# Patient Record
Sex: Female | Born: 1935
Health system: Southern US, Community
[De-identification: ages and names within clinical notes are randomized; demographics above are authoritative.]

## PROBLEM LIST (undated history)

## (undated) DIAGNOSIS — M1990 Unspecified osteoarthritis, unspecified site: Secondary | ICD-10-CM

## (undated) DIAGNOSIS — R32 Unspecified urinary incontinence: Secondary | ICD-10-CM

## (undated) DIAGNOSIS — M549 Dorsalgia, unspecified: Secondary | ICD-10-CM

## (undated) DIAGNOSIS — I82409 Acute embolism and thrombosis of unspecified deep veins of unspecified lower extremity: Secondary | ICD-10-CM

## (undated) DIAGNOSIS — R200 Anesthesia of skin: Secondary | ICD-10-CM

## (undated) DIAGNOSIS — S22000A Wedge compression fracture of unspecified thoracic vertebra, initial encounter for closed fracture: Secondary | ICD-10-CM

## (undated) DIAGNOSIS — E079 Disorder of thyroid, unspecified: Secondary | ICD-10-CM

## (undated) HISTORY — DX: Dorsalgia, unspecified: M54.9

## (undated) HISTORY — PX: CARPAL TUNNEL RELEASE: SHX101

## (undated) HISTORY — PX: ABDOMINAL HYSTERECTOMY: SHX81

## (undated) HISTORY — PX: OTHER SURGICAL HISTORY: SHX169

## (undated) HISTORY — DX: Unspecified urinary incontinence: R32

## (undated) HISTORY — DX: Acute embolism and thrombosis of unspecified deep veins of unspecified lower extremity: I82.409

## (undated) HISTORY — DX: Unspecified osteoarthritis, unspecified site: M19.90

## (undated) HISTORY — DX: Wedge compression fracture of unspecified thoracic vertebra, initial encounter for closed fracture: S22.000A

## (undated) HISTORY — PX: BACK SURGERY: SHX140

---

## 1999-10-09 ENCOUNTER — Other Ambulatory Visit: Admission: RE | Admit: 1999-10-09 | Discharge: 1999-10-09 | Payer: Self-pay | Admitting: Family Medicine

## 2000-10-29 ENCOUNTER — Other Ambulatory Visit: Admission: RE | Admit: 2000-10-29 | Discharge: 2000-10-29 | Payer: Self-pay | Admitting: *Deleted

## 2001-12-06 ENCOUNTER — Other Ambulatory Visit: Admission: RE | Admit: 2001-12-06 | Discharge: 2001-12-06 | Payer: Self-pay | Admitting: *Deleted

## 2006-01-08 ENCOUNTER — Other Ambulatory Visit: Admission: RE | Admit: 2006-01-08 | Discharge: 2006-01-08 | Payer: Self-pay | Admitting: Internal Medicine

## 2007-11-29 ENCOUNTER — Encounter: Admission: RE | Admit: 2007-11-29 | Discharge: 2007-11-29 | Payer: Self-pay | Admitting: Orthopedic Surgery

## 2008-03-19 ENCOUNTER — Encounter: Admission: RE | Admit: 2008-03-19 | Discharge: 2008-03-19 | Payer: Self-pay | Admitting: Orthopedic Surgery

## 2008-07-28 ENCOUNTER — Emergency Department (HOSPITAL_COMMUNITY): Admission: EM | Admit: 2008-07-28 | Discharge: 2008-07-28 | Payer: Self-pay | Admitting: Family Medicine

## 2008-11-15 ENCOUNTER — Encounter: Admission: RE | Admit: 2008-11-15 | Discharge: 2008-11-15 | Payer: Self-pay | Admitting: Neurosurgery

## 2008-12-21 ENCOUNTER — Inpatient Hospital Stay (HOSPITAL_COMMUNITY): Admission: RE | Admit: 2008-12-21 | Discharge: 2008-12-27 | Payer: Self-pay | Admitting: Neurosurgery

## 2009-03-23 ENCOUNTER — Encounter: Admission: RE | Admit: 2009-03-23 | Discharge: 2009-03-23 | Payer: Self-pay | Admitting: Internal Medicine

## 2009-03-30 ENCOUNTER — Ambulatory Visit (HOSPITAL_COMMUNITY): Admission: RE | Admit: 2009-03-30 | Discharge: 2009-03-30 | Payer: Self-pay | Admitting: Obstetrics and Gynecology

## 2009-04-18 ENCOUNTER — Ambulatory Visit: Admission: RE | Admit: 2009-04-18 | Discharge: 2009-04-18 | Payer: Self-pay | Admitting: Gynecologic Oncology

## 2009-05-01 ENCOUNTER — Inpatient Hospital Stay (HOSPITAL_COMMUNITY): Admission: RE | Admit: 2009-05-01 | Discharge: 2009-05-04 | Payer: Self-pay | Admitting: Orthopedic Surgery

## 2009-05-01 ENCOUNTER — Encounter: Payer: Self-pay | Admitting: Gynecology

## 2009-06-15 ENCOUNTER — Ambulatory Visit: Admission: RE | Admit: 2009-06-15 | Discharge: 2009-06-15 | Payer: Self-pay | Admitting: Gynecology

## 2009-11-21 ENCOUNTER — Ambulatory Visit: Admission: RE | Admit: 2009-11-21 | Discharge: 2009-11-21 | Payer: Self-pay | Admitting: Gynecology

## 2009-11-21 ENCOUNTER — Other Ambulatory Visit: Admission: RE | Admit: 2009-11-21 | Discharge: 2009-11-21 | Payer: Self-pay | Admitting: Gynecology

## 2010-04-21 ENCOUNTER — Emergency Department (HOSPITAL_COMMUNITY): Admission: EM | Admit: 2010-04-21 | Discharge: 2010-04-21 | Payer: Self-pay | Admitting: Emergency Medicine

## 2010-05-29 ENCOUNTER — Ambulatory Visit: Admission: RE | Admit: 2010-05-29 | Discharge: 2010-05-29 | Payer: Self-pay | Admitting: Gynecology

## 2010-05-29 ENCOUNTER — Other Ambulatory Visit: Admission: RE | Admit: 2010-05-29 | Discharge: 2010-05-29 | Payer: Self-pay | Admitting: Gynecology

## 2010-10-31 ENCOUNTER — Encounter
Admission: RE | Admit: 2010-10-31 | Discharge: 2010-10-31 | Payer: Self-pay | Source: Home / Self Care | Attending: Obstetrics and Gynecology | Admitting: Obstetrics and Gynecology

## 2011-02-24 LAB — COMPREHENSIVE METABOLIC PANEL
Albumin: 3.8 g/dL (ref 3.5–5.2)
Alkaline Phosphatase: 54 U/L (ref 39–117)
BUN: 24 mg/dL — ABNORMAL HIGH (ref 6–23)
Chloride: 106 mEq/L (ref 96–112)
Creatinine, Ser: 0.7 mg/dL (ref 0.4–1.2)
GFR calc non Af Amer: >60 mL/min (ref >60)
Glucose, Bld: 103 mg/dL — ABNORMAL HIGH (ref 70–99)
Total Bilirubin: 0.5 mg/dL (ref 0.3–1.2)

## 2011-02-24 LAB — CBC
HCT: 38.2 % (ref 36.0–46.0)
Hemoglobin: 12 g/dL (ref 12.0–15.0)
Hemoglobin: 12.8 g/dL (ref 12.0–15.0)
MCHC: 33.5 g/dL (ref 30.0–36.0)
MCV: 96 fL (ref 78.0–100.0)
MCV: 95.5 fL (ref 78.0–100.0)
Platelets: 192 10*3/uL (ref 150–400)
RBC: 3.72 MIL/uL — ABNORMAL LOW (ref 3.87–5.11)
WBC: 10 10*3/uL (ref 4.0–10.5)
WBC: 4.8 10*3/uL (ref 4.0–10.5)

## 2011-02-24 LAB — TYPE AND SCREEN
ABO/RH(D): A NEG
Antibody Screen: NEGATIVE

## 2011-02-24 LAB — BASIC METABOLIC PANEL
CO2: 26 mEq/L (ref 19–32)
Calcium: 8.2 mg/dL — ABNORMAL LOW (ref 8.4–10.5)
Chloride: 99 mEq/L (ref 96–112)
GFR calc Af Amer: >60 mL/min (ref >60)
Sodium: 132 mEq/L — ABNORMAL LOW (ref 135–145)

## 2011-02-24 LAB — DIFFERENTIAL
Basophils Absolute: 0 10*3/uL (ref 0.0–0.1)
Basophils Relative: 0 % (ref 0–1)
Lymphocytes Relative: 28 % (ref 12–46)
Monocytes Absolute: 0.2 10*3/uL (ref 0.1–1.0)
Neutro Abs: 3.2 10*3/uL (ref 1.7–7.7)
Neutrophils Relative %: 66 % (ref 43–77)

## 2011-02-24 LAB — ABO/RH: ABO/RH(D): A NEG

## 2011-03-04 LAB — ABO/RH: ABO/RH(D): A NEG

## 2011-03-04 LAB — CBC
HCT: 40.2 % (ref 36.0–46.0)
Platelets: 250 10*3/uL (ref 150–400)
RDW: 13.5 % (ref 11.5–15.5)
WBC: 5.9 10*3/uL (ref 4.0–10.5)

## 2011-03-04 LAB — BASIC METABOLIC PANEL
BUN: 15 mg/dL (ref 6–23)
Creatinine, Ser: 0.51 mg/dL (ref 0.4–1.2)
GFR calc non Af Amer: >60 mL/min (ref >60)
Glucose, Bld: 95 mg/dL (ref 70–99)

## 2011-03-04 LAB — TYPE AND SCREEN: ABO/RH(D): A NEG

## 2011-04-01 NOTE — Consult Note (Signed)
[name], [name]               ACCOUNT NO.:  [id number]   MEDICAL RECORD NO.:  [id number]          PATIENT TYPE:  OUT   LOCATION:  GYN                          FACILITY:  [facility]   PHYSICIAN:  John T. Kyla Balzarine, M.D.    DATE OF BIRTH:  03/08/1928   DATE OF CONSULTATION:  04/18/2009  DATE OF DISCHARGE:                                 CONSULTATION   CHIEF COMPLAINT:  [name] is a 77 year old seen at the request of Dr.  Huntley Dec and Dr. Ricki Miller with a newly diagnosed pelvic mass.   HISTORY OF PRESENT ILLNESS:  The patient underwent spinal surgery in  February 2010 and dates most of her symptoms to following her disk  surgery.  She had noted some abdominal twitching in the autumn of  2009, but was aware of a tight corseting associated with her back  brace.  Over the past 2 months she has had a sensation of something  moving in the lower abdomen, but with pressure symptoms.  She has had  urinary incontinence for many years but feels that this has worsened and  she had some transient constipation following her back surgery.  She has  noted bilateral leg swelling since surgery, treated with a short course  of Lasix and compression hose with improvement.  Unchanged since surgery  is sciatic type pain shooting down both legs with distal numbness.  CT  of abdomen and pelvis performed 05/07 revealed a 12.2 x 9.2 x 9.4 cm  mass with nodularity along the periphery, located centrally but no  radiologic evidence of lymphadenopathy, carcinomatosis or visceral  metastasis.  Chest x-ray was negative and a CA-125 value was 6.1.  The  patient was seen by Dr. Henderson Cloud, who confirmed a pelvic mass.  Pap smear  was negative.   PAST MEDICAL HISTORY:  Dominated by arthritis with degenerative disk  disease and multiple urinary tract infections.  She is post carpal  tunnel surgery on the left wrist, spinal surgery as above, benign breast  lumpectomy and 1 vaginal delivery.   MEDICATIONS:  Include Detrol LA,  Actonel, low-dose aspirin, Percocet,  Celebrex, magnesium oxide, Lasix (currently discontinued), Protonix or  Mylanta p.r.n., fish oil, Centrum Silver, Os-Cal plus D.   ALLERGIES:  SULFA DRUGS cause hives but Celebrex has given her no  problems.   PERSONAL AND SOCIAL HISTORY:  The patient is married and her husband has  multiple medical problems.  She denies tobacco, ethanol or recreational  drug use.   FAMILY HISTORY:  Significant for 2 sisters with breast cancer, mother  with colon cancer, father and 2 brothers with prostate cancer.   REVIEW OF SYSTEMS:  Essentially negative 10-point system review other  than noted above.   EXAM:  VITAL SIGNS:  Stable and afebrile as recorded in chart.  Weight  102 pounds.  CONSTITUTIONAL:  The patient is alert and oriented x3 in no acute  distress.  ENT:  Benign with clear oropharynx.  NECK:  Supple neck without goiter and no scleral icterus.  LUNGS:  Fields clear.  HEART:  Regular rate and rhythm with no JVD.  BACK:  Kyphoscoliosis with lumbar vertical incision that is well-healed.  No tenderness to spinous or CVA percussion.  ABDOMEN:  Soft and benign with no ascites, tenderness or hernia  palpable.  There is a firm mass palpable 3 fingerbreadths above the  umbilicus which is modestly mobile and there is some tenderness with  manipulation.  EXTREMITIES:  The to strength in the lower extremities and diminished  DTRs in the knees.  Currently no edema, absent cords or Homan's.  NEUROLOGIC:  Diminished DTRs and light sensation in the distal lower  extremities.  Cranial nerves intact.  SKIN:  No lesions.  PELVIC:  External genitalia and BUS, bladder and urethra normal with a  small cystocele and retraction of the bladder cephalad along with the  cervix.  No vaginal or cervical mucosal lesions; atrophic mucosa.  Bimanual and rectovaginal examinations disclose uterus deviated  posterior and to the right.  A central firm mass with minimal  tenderness  to bimanual palpation is anterior to the uterus.  This is relatively  smooth, poorly mobile approximately 12-14 cm greatest diameter.   ASSESSMENT/PLAN:  Pelvic mass with normal CA-125 value, likely benign,  rule out ovarian malignancy.   RECOMMENDATIONS:  I had a long discussion with the patient and her  daughter.  I detailed the need for an exploratory laparotomy with  TAH/BSO.  Frozen section would help guide the procedure and, if  malignancy were confirmed, we would advocate comprehensive surgical  staging.  This was detailed to the patient along with risks and benefits  of the procedure and she wishes to proceed as soon as possible.  We will  make arrangements for surgery to be performed in our service at [facility] within the next couple of weeks as this would be much more  convenient for the patient and family regarding perioperative care.      John T. Kyla Balzarine, M.D.  Electronically Signed     JTS/MEDQ  D:  04/18/2009  T:  04/18/2009  Job:  [id number]   cc:   Guy Sandifer. Henderson Cloud, M.D.  Fax: [phone]   Telford Nab, R.N.  [address]  [place], [place] [address]   Juline Patch, M.D.  Fax: [phone]

## 2011-04-01 NOTE — Op Note (Signed)
Dana Klein, TAPLEY               ACCOUNT NO.:  [id number]   MEDICAL RECORD NO.:  [id number]          PATIENT TYPE:  INP   LOCATION:  3005                         FACILITY:  [facility]   PHYSICIAN:  Danae Orleans. Venetia Maxon, M.D.  DATE OF BIRTH:  [birth year]   DATE OF PROCEDURE:  12/21/2008  DATE OF DISCHARGE:                               OPERATIVE REPORT   PREOPERATIVE DIAGNOSES:  Spondylolisthesis, scoliosis, stenosis,  herniated disk, and radiculopathy, L5-S1.   POSTOPERATIVE DIAGNOSES:  Spondylolisthesis, scoliosis, stenosis,  herniated disk, and radiculopathy, L5-S1.   PROCEDURES:  Laminectomy and decompression, L5-S1 with transforaminal  lumbar interbody fusion with PEEK interbody cage, morcellized bone  autograft and bone morphogenetic protein EquivaBone with nonsegmental  pedicle screw fixation, L5-S1 bilaterally and posterolateral  arthrodesis.   SURGEON:  Danae Orleans. Venetia Maxon, MD   ASSISTANTS:  1. Georgiann Cocker, RN  2. Clydene Fake, MD   ANESTHESIA:  General endotracheal anesthesia.   ESTIMATED BLOOD LOSS:  200 mL   COMPLICATIONS:  None.   DISPOSITION:  Recovery.   INDICATIONS:  Dionisia Pacholski is a 77 year old woman with severe left leg  pain, scoliosis, lumbar spine spondylolisthesis, and also lateral  foraminal stenosis at the L5-S1 level, left greater than right.  It was  elected to take her surgery for decompression and fusion at this  affected level.   PROCEDURE:  Ms. Viele was brought to the operating room.  Following  satisfactory and uncomplicated induction of general endotracheal  anesthesia, and placement of intravenous lines and Foley catheter, the  patient was placed in the prone position on the [place] table.  Her low  back was prepped and draped in the usual sterile fashion.  The area of  planned incision was infiltrated with local lidocaine.  Incision was  made and carried to the lumbodorsal fascia, which was incised  bilaterally.  Subperiosteal dissection was  performed exposing the L5  transverse processes and sacral alae.  Intraoperative x-ray demonstrated  marker probes at L4 and L5 transverse processes and sacral alae.  Subsequently, a laminectomy of L5 on the left was performed and the  thecal sac was decompressed, the S1 nerve root was decompressed, and the  L5 nerve root was decompressed as they extended out the neural foramen.  There was significant spondylolisthesis and significant amount of disk  compression of both the L5 and S1 nerve roots.  It was therefore elected  to perform a diskectomy, and this was done thoroughly from the left  side, and the nerve roots were decompressed.  After thorough diskectomy  and trial sizing, it was elected to perform a transforaminal lumbar  interbody fusion with PEEK cage and an 8-mm cage was selected, packed  with BMP and EquivaBone, additional BMP was placed within the interspace  and additional EquivaBone was placed, and the cage was placed and  rotated into position and tamped into position appropriately.  Additional bone autograft was placed overlying the cage and along with  additional EquivaBone.  All the neural elements were felt to be well  decompressed and foraminal height had been restored.  It was then  elected to decorticate the right side, and the facet joint was  decorticated and eburnated for fusion.  The pedicle screw fixation was  then placed using a 6.5 x 45 mm screw at the sacrum bilaterally and 5.5  x 45 mm screws at L5 bilaterally.  There was a lateral cut out of the  right L5 screw and it was moved medially and appeared to be solidly in  bone.  AP film demonstrated that this was at the edge of the pedicle,  but it was felt at this point that moving it more medially would  probably be more destabilizing than improving its position.  It was felt  to be solidly within the bone, so it was left in its position.  All  other screws appeared to be well within the pedicle.  A 35-mm  preloaded  rods were placed and locked down in situ.  Remaining BMP, EquivaBone,  and autograft was then placed in the right posterolateral region.  Prior  to placing the final bone graft, the wound was extensively irrigated.  Soft tissues were inspected and found to be in good repair and the nerve  roots were well decompressed.  The self-retaining retractors were  removed.  The lumbodorsal fascia was closed with 1 Vicryl suture,  subcutaneous tissues were reapproximated with 2-0 Vicryl interrupted  inverted sutures, and the skin edges were reapproximated with 3-0 Vicryl  subcuticular stitch.  The wound was dressed with benzoin, Steri-Strips,  Telfa gauze, and tape.  The patient was extubated in the operating room  and taken to the recovery in stable satisfactory condition having  tolerated the operation well.  Counts were correct at the end of case.      Danae Orleans. Venetia Maxon, M.D.  Electronically Signed     JDS/MEDQ  D:  12/21/2008  T:  12/22/2008  Job:  [id number]

## 2011-04-01 NOTE — Discharge Summary (Signed)
NAMERESHANDA, Dana Klein               ACCOUNT NO.:  [id number]   MEDICAL RECORD NO.:  [id number]          PATIENT TYPE:  INP   LOCATION:  3005                         FACILITY:  [facility]   PHYSICIAN:  Danae Orleans. Venetia Maxon, M.D.  DATE OF BIRTH:  10-12-1928   DATE OF ADMISSION:  12/21/2008  DATE OF DISCHARGE:                               DISCHARGE SUMMARY   REASON FOR ADMISSION:  Spondylolisthesis, scoliosis, stenosis, and  herniated lumbar disk L5-S1 level.   FINAL DIAGNOSES:  Spondylolisthesis, scoliosis, stenosis, and herniated  lumbar disk L5-S1 level.   HISTORY OF PRESENT ILLNESS AND HOSPITAL COURSE:  [name] is a 77-year-  old woman with significant spondylolisthesis, stenosis, scoliosis at L5-  S1 with may be the left leg pain.  It was elected to take her to surgery  for lumbar decompression and fusion.  She did well with that procedure,  was gradually mobilized with physical therapy.  It was elected to  transfer her for further inpatient rehabilitative care to a skilled  nursing facility and she was discharged home on December 27, 2008, in  stable and satisfactory condition, having tolerated operation and  hospitalization well.  She was instructed to wear back brace when out of  bed, to use pain medication as needed.  This will either consist of  Percocet 1-2 every 4 hours as needed for pain or Vicodin 1-2 every 4  hours as needed for pain.  Additionally, Robaxin 500 mg every 6 hours.  She was instructed to wear her brace when out.  She will return to my  office for a recheck in 3 weeks with repeat radiographs.   Preoperative medications were also continued, these include Detrol LA 4  mg once daily, Actonel 35 mg weekly, fish oil, Centrum, Os-Cal, Osteo Bi-  Flex, and 81 mg aspirin.  She was using Darvocet before her surgery and  can go back to that one.  Her pain is under better control.      Danae Orleans. Venetia Maxon, M.D.  Electronically Signed     JDS/MEDQ  D:  12/27/2008  T:   12/27/2008  Job:  [id number]

## 2011-04-01 NOTE — Op Note (Signed)
Dana Klein, Dana Klein               ACCOUNT NO.:  [id number]   MEDICAL RECORD NO.:  [id number]          PATIENT TYPE:  INP   LOCATION:  0003                         [facility]ACILITY:  [facility]   PHYSICIAN:  De Blanch, M.D.DATE OF BIRTH:  1928/09/14   DATE OF PROCEDURE:  DATE OF DISCHARGE:                               OPERATIVE REPORT   PREOPERATIVE DIAGNOSIS:  Pelvic mass with nodules.   POSTOPERATIVE DIAGNOSIS:  Hematometra with possible endometrial atypical  hyperplasia or grade 1 endometrial carcinoma (no invasion found on  frozen section).   PROCEDURE:  Total abdominal hysterectomy, bilateral salpingo-  oophorectomy.   SURGEON:  De Blanch.   FIRST ASSISTANT:  Antionette Char MD and Telford Nab, R.N.   ANESTHESIA:  General orotracheal tube.   ESTIMATED BLOOD LOSS:  100 mL.   SURGICAL FINDINGS:  At the time of exploratory laparotomy the patient  was found to have an enlarged uterus approximately [redacted] weeks gestational  size.  The external surface of the uterine serosa was smooth with no  nodularity. Tubes and ovaries appeared normal for the patient's age. On  frozen section the patient was found to have fluid in the uterus and  nodules on the endometrium which were considered to be either atypical  adenomatous hyperplasia or a well-differentiated adenocarcinoma.  There  was no evidence of a papillary serous carcinoma.   Exploration of the upper abdomen including the diaphragm and capsule of  the liver, omentum, pelvic and periaortic lymph nodes and appendix were  normal.   PROCEDURE:  The patient brought to the operating room and after  satisfactory attainment of general anesthesia was placed in the modified  lithotomy position in [place] stirrups.  The anterior abdominal wall,  perineum and vagina were prepped with Betadine, a Foley catheter was  inserted, and the patient was draped.  The abdomen was entered through a  low midline incision.  Peritoneal  washings were obtained from the pelvis  and sent to cytopathology.  A Bookwalter retractor was positioned and  the bowel was packed out of the pelvis.  The round ligaments were  divided and the retroperitoneal space was opened identifying the  external and internal iliac vessels and the ureter.  The ovarian vessels  were skeletonized, clamped, cut, free tied and suture ligated.  The  bladder flap was incised and then advanced to sharp dissection.  The  uterine vessels were skeletonized, clamped, cut and suture ligated in a  stepwise fashion. The paracervical and cardinal ligaments were clamped,  cut and suture ligated.  The vaginal angles were then crossclamped and  the vagina transected from its attachment to the cervix.  The cervix was  inspected and found to have been removed in its entirety.  The uterus,  tubes and ovaries were submitted to frozen section with the above-noted  findings.  The vaginal angles were closed with transfixing sutures of 0-  Vicryl.  The central portion of the vagina closed with interrupted  figure-of-eight sutures of 0-Vicryl.  The pelvis was irrigated and found  to be hemostatic.  Packs and retractors removed.  The anterior abdominal  wall  was then closed in layers with the first being a running mass  closure using #1 PDS.  Subcutaneous tissue was irrigated.  A single  suture of 2-0 Vicryl was  used to reapproximate the subcutaneous tissue overlying the knots of the  PDS.  The remainder of the skin incision was closed with staples.  A  dressing was applied.  The patient was awakened from anesthesia, taken  to the recovery room in satisfactory condition.  Sponge, needle and  instrument counts correct x2.      De Blanch, M.D.  Electronically Signed     DC/MEDQ  D:  05/01/2009  T:  05/01/2009  Job:  [id number]   cc:   Guy Sandifer. Henderson Cloud, M.D.  Fax: [phone]   Juline Patch, M.D.  Fax: [phone]   Telford Nab, R.N.  [address]   [place], [place] [address]

## 2011-04-04 NOTE — Consult Note (Signed)
Dana Klein, UNCAPHER               ACCOUNT NO.:  [id number]   MEDICAL RECORD NO.:  [id number]          PATIENT TYPE:  OUT   LOCATION:                               FACILITY:  [facility]   PHYSICIAN:  De Blanch, M.D.DATE OF BIRTH:  1928-09-24   DATE OF CONSULTATION:  DATE OF DISCHARGE:                                 CONSULTATION   CHIEF COMPLAINT:  Postoperative follow-up, endometrial cancer.   INTERVAL HISTORY:  The patient underwent a exploratory laparotomy for  pelvic mass on May 01, 2009.  The mass turned out to be enlarged uterus  with hematometra.  Final pathology has revealed that she had an  endometrial carcinoma with focal superficial myometrial invasion (1 mm).  Cervix, tubes, ovaries and washings were free of any evidence of  malignancy.  The patient reports she has had an uncomplicated  postoperative course.  She feels that she has returned to nearly full  levels of energy and activity, has no pain, and has not had any fevers.   PHYSICAL EXAMINATION:  Weight 102 pounds, blood pressure 120/58.  ABDOMEN:  Is soft, nontender.  No mass, organomegaly, ascites or hernias  noted.  Midline incision is healing well.  PELVIC EXAM:  EGBUS, vagina, bladder and urethra are normal.  Cervix and  uterus surgically absent.  Vaginal cuff is healing.  Bimanual  rectovaginal exam reveal no masses, induration or nodularity.   IMPRESSION:  Stage IA grade 1 endometrial cancer status post TAH-BSO.  The patient has had a good postoperative recovery and can return to full  levels of activity.   We reviewed the patient's pathology which is very favorable.  We would  ask her to return in 3 months for continuing surveillance.      De Blanch, M.D.  Electronically Signed     DC/MEDQ  D:  06/22/2009  T:  06/22/2009  Job:  [id number]   cc:   Guy Sandifer. Henderson Cloud, M.D.  Fax: [phone]   Juline Patch, M.D.  Fax: [phone]   Telford Nab, R.N.  [address]  [place], [place] [address]

## 2011-04-04 NOTE — Discharge Summary (Signed)
Dana Klein, Dana Klein               ACCOUNT NO.:  [id number]   MEDICAL RECORD NO.:  [id number]          PATIENT TYPE:  INP   LOCATION:  1530                         FACILITY:  [facility]   PHYSICIAN:  Roseanna Rainbow, M.D.DATE OF BIRTH:  06/25/1928   DATE OF ADMISSION:  05/01/2009  DATE OF DISCHARGE:  05/04/2009                               DISCHARGE SUMMARY   CHIEF COMPLAINT:  The patient is a 77 year old with a newly diagnosed  pelvic mass who presents for operative management.  Please see the  dictated history and physical as per Dr. Jonny Ruiz T. Soper.   HOSPITAL COURSE:  The patient was admitted and underwent a total  abdominal hysterectomy and bilateral salpingo-oophorectomy.  Please see  the dictated operative summary for further details.  On postoperative  day 1, hemoglobin was 12 and hematocrit was 35.7.  A basic metabolic  profile was normal.  Hematocrit was repeated on May 03, 2009, and this  was 36.3.  Her diet was gradually advanced.  She was discharged to home  on postoperative day 3 tolerating a regular diet.   DISCHARGE DIAGNOSIS:  Endometrial adenocarcinoma, grade 1, FIGO stage  IA.   PROCEDURES:  Total abdominal hysterectomy and bilateral salpingo-  oophorectomy.   CONDITION:  Stable.   DIET:  Regular.   MEDICATIONS:  1. Detrol LA.  2. Actonel.  3. Aspirin.  4. Percocet.  5. Celebrex.  6. Magnesium oxide.  7. Protonix.  8. Mylanta.  9. Fish oil.  10.Centrum Silver.  11.Os-Cal plus D.  12.A new prescription was given for Percocet 1 to 2 tabs every 6 hours      as needed.   ACTIVITY:  Pelvic rest, progressive activity.   DISPOSITION:  The patient was to follow up in the GYN Oncology office.      Roseanna Rainbow, M.D.  Electronically Signed     LAJ/MEDQ  D:  05/07/2009  T:  05/07/2009  Job:  [id number]   cc:   Telford Nab, R.N.  [address]  [place], [place] [address]   Guy Sandifer. Henderson Cloud, M.D.  Fax: [phone]   Juline Patch, M.D.  Fax: [phone]

## 2011-06-10 ENCOUNTER — Encounter: Payer: Self-pay | Admitting: Podiatrist

## 2011-06-10 DIAGNOSIS — M1990 Unspecified osteoarthritis, unspecified site: Secondary | ICD-10-CM

## 2011-06-10 DIAGNOSIS — M539 Dorsopathy, unspecified: Secondary | ICD-10-CM | POA: Insufficient documentation

## 2011-06-10 DIAGNOSIS — R32 Unspecified urinary incontinence: Secondary | ICD-10-CM | POA: Insufficient documentation

## 2011-06-10 DIAGNOSIS — H9190 Unspecified hearing loss, unspecified ear: Secondary | ICD-10-CM | POA: Insufficient documentation

## 2012-03-15 ENCOUNTER — Other Ambulatory Visit: Payer: Self-pay | Admitting: Internal Medicine

## 2012-03-15 DIAGNOSIS — R1310 Dysphagia, unspecified: Secondary | ICD-10-CM

## 2012-03-22 ENCOUNTER — Ambulatory Visit
Admission: RE | Admit: 2012-03-22 | Discharge: 2012-03-22 | Disposition: A | Discharge status: Home / Self Care | Payer: Medicare Other | Source: Ambulatory Visit | Attending: Internal Medicine | Admitting: Internal Medicine

## 2012-03-22 DIAGNOSIS — R1310 Dysphagia, unspecified: Secondary | ICD-10-CM

## 2013-04-09 ENCOUNTER — Encounter (HOSPITAL_COMMUNITY): Payer: Self-pay | Admitting: *Deleted

## 2013-04-09 ENCOUNTER — Emergency Department (HOSPITAL_COMMUNITY)
Admission: EM | Admit: 2013-04-09 | Discharge: 2013-04-09 | Disposition: A | Discharge status: Home / Self Care | Payer: Medicare Other | Source: Home / Self Care

## 2013-04-09 DIAGNOSIS — T07XXXA Unspecified multiple injuries, initial encounter: Secondary | ICD-10-CM

## 2013-04-09 DIAGNOSIS — IMO0002 Reserved for concepts with insufficient information to code with codable children: Secondary | ICD-10-CM

## 2013-04-09 DIAGNOSIS — L255 Unspecified contact dermatitis due to plants, except food: Secondary | ICD-10-CM

## 2013-04-09 HISTORY — DX: Anesthesia of skin: R20.0

## 2013-04-09 HISTORY — DX: Disorder of thyroid, unspecified: E07.9

## 2013-04-09 MED ORDER — TRIAMCINOLONE ACETONIDE 0.1 % EX CREA: TOPICAL_CREAM | Freq: Two times a day (BID) | CUTANEOUS | Status: DC

## 2013-04-09 MED ORDER — BACITRACIN-NEOMYCIN-POLYMYXIN OINTMENT TUBE
TOPICAL_OINTMENT | Freq: Once | CUTANEOUS | Status: AC
  Administered 2013-04-09: 12:00:00 via TOPICAL

## 2013-04-09 MED ORDER — PREDNISONE 20 MG PO TABS: ORAL_TABLET | ORAL | Status: DC

## 2013-04-09 NOTE — ED Notes (Signed)
Reports doing yardwork and getting small pruritic rash to left posterior elbow 1.5 wks ago; this week was trimming shrubbery and sustained multiple scratches to bilat forearms.  On Wed noticed elbow rash spreading up arm, and now has pruritic rash to RUE, RLE, and buttocks also.  Has been applying Neosporin and performing wound care with bandages to scratches; had used expired Clobetasol cream and some Benadryl cream for rash.

## 2013-04-09 NOTE — ED Provider Notes (Signed)
Medical screening examination/treatment/procedure(s) were performed by non-physician practitioner and as supervising physician I was immediately available for consultation/collaboration.  Franciso Dierks, M.D.  Elin Seats C Sherlyne Crownover, MD 04/09/13 2002 

## 2013-04-09 NOTE — ED Provider Notes (Signed)
History     CSN: [id number]  Arrival date & time 04/09/13  1110   None     Chief Complaint  Patient presents with  . Rash  . Wound Check    (Consider location/radiation/quality/duration/timing/severity/associated sxs/prior treatment) HPI Comments: This 79 year old female is very pleasant, active, energetic, self sufficient patient has been working in the garden and performing other outside activities in the past week or so. While reaching need an object she received several scrapes to the left forearm from a tree limb. This produced approximately 5 linear abrasions. She cleaned with soap and water and applied Neosporin and dressing twice a day. In addition, she developed a pruritic rash in the left upper arm that has since migrated to the torso and lower extremities. She denies constitutional symptoms. Denies fever or malaise. She insisted her tetanus is up to date having received it less than 5 years ago.   Past Medical History  Diagnosis Date  . Back pain   . Osteoarthritis   . Hearing loss   . Bladder incontinence   . Leg numbness   . Numbness of foot   . Thyroid disease     Past Surgical History  Procedure Laterality Date  . Left wrist surgery    . Carpal tunnel release    . Back surgery    . Abdominal hysterectomy      Family History  Problem Relation Age of Onset  . Lung cancer Mother   . Colon cancer Mother   . Pneumonia Father   . Heart defect Father     History  Substance Use Topics  . Smoking status: Never Smoker   . Smokeless tobacco: Not on file  . Alcohol Use: No    OB History   Grav Para Term Preterm Abortions TAB SAB Ect Mult Living                  Review of Systems  Constitutional: Negative.   HENT: Negative.   Eyes: Negative.   Respiratory: Negative.   Gastrointestinal: Negative.   Genitourinary: Negative.   Skin: Positive for rash and wound.       As per history of present illness  Neurological: Negative.    Psychiatric/Behavioral: Negative.     Allergies  Sulfa antibiotics  Home Medications   Current Outpatient Rx  Name  Route  Sig  Dispense  Refill  . acetaminophen (TYLENOL) 500 MG tablet   Oral   Take 500 mg by mouth 2 (two) times daily.         Marland Kitchen aspirin 81 MG tablet   Oral   Take 81 mg by mouth daily.           . Calcium Carbonate-Vitamin D (OSCAL 500/200 D-3 PO)   Oral   Take by mouth 2 (two) times daily.           . DULoxetine (CYMBALTA) 20 MG capsule   Oral   Take 20 mg by mouth daily.         Marland Kitchen levothyroxine (SYNTHROID, LEVOTHROID) 50 MCG tablet   Oral   Take 50 mcg by mouth daily before breakfast.         . magnesium oxide (MAG-OX) 400 MG tablet   Oral   Take 400 mg by mouth daily.         . meloxicam (MOBIC) 15 MG tablet   Oral   Take 15 mg by mouth daily.         . Misc  Natural Products (OSTEO BI-FLEX JOINT SHIELD PO)   Oral   Take by mouth.         . Omega-3 Fatty Acids (FISH OIL) 1200 MG CAPS   Oral   Take 1,200 mg by mouth 2 (two) times daily.           . solifenacin (VESICARE) 10 MG tablet   Oral   Take by mouth daily.         . Multiple Vitamins-Minerals (CENTRUM SILVER PO)   Oral   Take by mouth daily.           . predniSONE (DELTASONE) 20 MG tablet      Take 3 tabs po on first day, 2 tabs second day, 2 tabs third day, 1 tab fourth, fifth and sixth days. Take with food.   13 tablet   0   . Propoxyphene N-Acetaminophen (DARVOCET-N 50 PO)   Oral   Take by mouth daily.           . risedronate (ACTONEL) 35 MG tablet   Oral   Take 35 mg by mouth every 7 (seven) days. with water on empty stomach, nothing by mouth or lie down for next 30 minutes.          . tolterodine (DETROL LA) 4 MG 24 hr capsule   Oral   Take 4 mg by mouth daily.           Marland Kitchen triamcinolone cream (KENALOG) 0.1 %   Topical   Apply topically 2 (two) times daily. Apply for 2 weeks. May use on face   30 g   0     BP 188/92  Pulse 70   Temp(Src) 98.8 F (37.1 C) (Oral)  Resp 20  SpO2 99%  Physical Exam  Nursing note and vitals reviewed. Constitutional: She is oriented to person, place, and time. She appears well-developed. No distress.  HENT:  Head: Normocephalic and atraumatic.  Eyes: Conjunctivae and EOM are normal. Pupils are equal, round, and reactive to light.  Neck: Normal range of motion. Neck supple.  Cardiovascular: Normal rate and normal heart sounds.   Pulmonary/Chest: Effort normal and breath sounds normal.  Musculoskeletal: She exhibits no edema and no tenderness.  Neurological: She is alert and oriented to person, place, and time. She exhibits normal muscle tone.  Skin: Skin is warm and dry. Rash noted.  The left forearm with abrasions and no signs of infection. They are healing well. No drainage, erythema or lymphangitis. The right forearm demonstrates 3 or 4 small  areas of superficial ecchymosis. the rash on the left upper arm that has migrated to other areas of the body are cutaneous as well as papular, flesh-colored to read. Ms. papules have a slightly rough texture. No bruising or drainage.   Psychiatric: She has a normal mood and affect. Her behavior is normal. Judgment and thought content normal.    ED Course  Procedures (including critical care time)  Labs Reviewed - No data to display No results found.   1. Multiple abrasions   2. Contact dermatitis due to plant       MDM  Triamcinolone cream to affected areas twice a day Prednisone as directed May take Claritin 10 mg daily to assist with itching Keep the abrasions clean and dry. Watch for signs of infection and return for problems. Apply Neosporin and dressing to the abrasions of the left arm prior to discharge May return for any new symptoms or problems.  Hayden Rasmussen, NP 04/09/13 1213  Hayden Rasmussen, NP 04/09/13 [phone]

## 2013-08-25 ENCOUNTER — Telehealth: Payer: Self-pay | Admitting: *Deleted

## 2013-08-25 ENCOUNTER — Encounter: Payer: Self-pay | Admitting: Neurology

## 2013-08-25 ENCOUNTER — Ambulatory Visit (INDEPENDENT_AMBULATORY_CARE_PROVIDER_SITE_OTHER): Payer: Medicare Other | Admitting: Neurology

## 2013-08-25 VITALS — BP 169/85 | HR 68 | Ht 59.0 in | Wt 101.0 lb

## 2013-08-25 DIAGNOSIS — G20 Parkinson's disease: Secondary | ICD-10-CM

## 2013-08-25 MED ORDER — CARBIDOPA-LEVODOPA 25-100 MG PO TABS: 1.0000 | ORAL_TABLET | Freq: Three times a day (TID) | ORAL | Status: DC

## 2013-08-25 NOTE — Progress Notes (Signed)
[name]    Provider:  Dr Hosie Poisson Referring Provider: Juline Patch, MD Primary Care Physician:  Juline Patch, MD  CC:  Tremor in hands and mouth  HPI:  Dana Klein is a 79 y.o. female here as a referral from Dr. Ricki Miller for tremor in hands and mouth  Tremor started around 1 year ago per the patient, involves mouth and bilateral hands. Described only as a rest tremor, unsure for started unilateral does currently bilateral. She denies any action or postural component. She feels it has been progressing over time. For the past few months she has also noticed stiffness and pain in her legs. Described as a muscle cramping sensation. Feels she has slowed down and her walking is not as fast. Feels more unsteady on her feet, trouble turning, has had some falls in the past. Feels her balance is off. Has noticed some change in her writing, feels it is Messier and possibly smaller. She gets up over night frequently to urinate, no history of REM behavior disorder. Notes good sense of smell. No history of exposure to dopamine blocking agents. Was recently started on Requip and Neurontin for possible restless leg syndrome and muscle cramps. Has not noticed much benefit but does note it helps her sleep better at night.  Review of Systems: Out of a complete 14 system review, the patient complains of only the following symptoms, and all other reviewed systems are negative. Positive for muscle cramps tremor  History   Social History  . Marital Status: Married    Spouse Name: N/A    Number of Children: 1  . Years of Education: 12   Occupational History  .      Retired   Social History Main Topics  . Smoking status: Never Smoker   . Smokeless tobacco: Never Used  . Alcohol Use: No  . Drug Use: No  . Sexual Activity: Not on file   Other Topics Concern  . Not on file   Social History Narrative   Patient is a widow . Patient is retired and has a Architect.   Right handed.   Caffeine- One cup daily coffee.    Family History  Problem Relation Age of Onset  . Lung cancer Mother   . Colon cancer Mother   . Pneumonia Father   . Heart defect Father     Past Medical History  Diagnosis Date  . Back pain   . Osteoarthritis   . Hearing loss   . Bladder incontinence   . Leg numbness   . Numbness of foot   . Thyroid disease     Past Surgical History  Procedure Laterality Date  . Left wrist surgery    . Carpal tunnel release    . Back surgery    . Abdominal hysterectomy      Current Outpatient Prescriptions  Medication Sig Dispense Refill  . acetaminophen (TYLENOL) 500 MG tablet Take 500 mg by mouth 2 (two) times daily.      Marland Kitchen aspirin 81 MG tablet Take 81 mg by mouth daily.        . Calcium Carbonate-Vitamin D (OSCAL 500/200 D-3 PO) Take by mouth 2 (two) times daily.        . DULoxetine (CYMBALTA) 20 MG capsule Take 20 mg by mouth daily.      Marland Kitchen gabapentin (NEURONTIN) 300 MG capsule Take 300 capsules by mouth daily.      Marland Kitchen levothyroxine (SYNTHROID, LEVOTHROID) 50 MCG tablet Take 50  mcg by mouth daily before breakfast.      . magnesium oxide (MAG-OX) 400 MG tablet Take 400 mg by mouth daily.      . meloxicam (MOBIC) 15 MG tablet Take 15 mg by mouth daily.      . Misc Natural Products (OSTEO BI-FLEX JOINT SHIELD PO) Take by mouth.      . Multiple Vitamins-Minerals (CENTRUM SILVER PO) Take by mouth daily.        . Omega-3 Fatty Acids (FISH OIL) 1200 MG CAPS Take 1,200 mg by mouth 2 (two) times daily.        Marland Kitchen rOPINIRole (REQUIP) 1 MG tablet Take 1 mg by mouth daily.      . solifenacin (VESICARE) 10 MG tablet Take by mouth daily.      Marland Kitchen tolterodine (DETROL LA) 4 MG 24 hr capsule Take 4 mg by mouth daily.        Marland Kitchen triamcinolone cream (KENALOG) 0.1 % Apply topically 2 (two) times daily. Apply for 2 weeks. May use on face  30 g  0   No current facility-administered medications for this visit.    Allergies as of 08/25/2013 - Review Complete  08/25/2013  Allergen Reaction Noted  . Sulfa antibiotics Rash 06/10/2011    Vitals: BP 169/85  Pulse 68  Ht 4\' 11"  (1.499 m)  Wt 101 lb (45.813 kg)  BMI 20.39 kg/m2 Last Weight:  Wt Readings from Last 1 Encounters:  08/25/13 101 lb (45.813 kg)   Last Height:   Ht Readings from Last 1 Encounters:  08/25/13 4\' 11"  (1.499 m)     Physical exam: Exam: Gen: NAD, conversant Eyes: anicteric sclerae, moist conjunctivae HENT: Atraumatic, oropharynx clear Neck: Trachea midline; supple,  Lungs: CTA, no wheezing, rales, rhonic                          CV: RRR, no MRG Abdomen: Soft, non-tender;  Extremities: No peripheral edema  Skin: Normal temperature, no rash,  Psych: Appropriate affect, pleasant  Neuro: MS: AA&Ox3, appropriately interactive, normal affect   Speech: fluent w/o paraphasic error  Memory: good recent and remote recall  CN: PERRL, EOMI no nystagmus, bilat ptosis, sensation intact to LT V1-V3 bilat, face symmetric, no weakness, hearing grossly intact, palate elevates symmetrically, shoulder shrug 5/5 bilat,  tongue protrudes midline, no fasiculations noted.  Motor: normal bulk and tone Strength: 5/5  In all extremities  Coord: rapid alternating and point-to-point (FNF, HTS) movements intact with mild intention tremor bilat  Reflexes: symmetrical, bilat downgoing toes  Sens: LT intact in all extremities  Brief Motor UPDRS  Speech: hypophonic/dysarthria  Facial Expression: Decreased blink rate Tremor: Rest R 2 L 2 Action/postural R 1 L 1 Rigidity: Mild increase RUE Finger taps:   R: 1 L: 1  Open/close hands: R: 1 L: 1  Foot taps: R: 1 L: 1  Arising from Chair: 1, slow but able to stand without pushing off Gait/FOG: Slow, stooped, slight shuffling, decreased arm swing R>L, re-emergent tremor R hand    Assessment:  After physical and neurologic examination, review of laboratory studies, imaging, neurophysiology testing and  pre-existing records, assessment will be reviewed on the problem list.  Plan:  Treatment plan and additional workup will be reviewed under Problem List.  79 year old woman presenting for initial evaluation of history of bilateral hand tremor and muscle cramping. Based on clinical history and physical exam her findings are most consistent with a diagnosis of parkinsonism.  Based on age and progression of symptoms this most likely represents idiopathic Parkinson's disease versus one of the atypical parkinsonism. Counseled patient extensively on this possible diagnoses. Counseled her that I believe her muscle cramping and pain is likely partly due to the parkinsonism and should improve with treatment. Will start patient on Sinemet 25-100 three times a day.  1)Parkinsonism  -start Sinemet 25-100 IR, titrated up to 1 tablet TID -continue Requip 1mg  qhs for now -referral for Neuro-rehab PT therapy -follow up in 3 to 4 months

## 2013-08-25 NOTE — Patient Instructions (Signed)
Overall you are doing fairly well but I do want to suggest a few things today:   As far as your medications are concerned, I would like to suggest starting a medication called Sinemet 25-100. Titrate following schedule: -1/2 tablet three times a day at 8am, noon and 4pm for 1 week then increase to 1 tablet three times a day at 8am, noon and 4pm  The main side effects with Sinemet are nausea and fatigue. Please take this food 30 minutes before or after you avoid and try to avoid protein rich foods around the time you   Continue the Neupro 1mg  nightly for now  We gave you a referral for physical therapy to help your walking.   I would like to see you back in 4 months, sooner if we need to. Please call us with any interim questions, concerns, problems, updates or refill requests.   My clinical assistant and will answer any of your questions and relay your messages to me and also relay most of my messages to you.   Our phone number is [phone]. We also have an after hours call service for urgent matters and there is a physician on-call for urgent questions. For any emergencies you know to call 911 or go to the nearest emergency room

## 2013-08-25 NOTE — Telephone Encounter (Signed)
For my referral for physical therapy, [facility] [address].

## 2013-11-02 ENCOUNTER — Encounter: Payer: Self-pay | Admitting: Podiatrist

## 2013-11-02 ENCOUNTER — Ambulatory Visit (INDEPENDENT_AMBULATORY_CARE_PROVIDER_SITE_OTHER): Payer: Medicare Other | Admitting: Podiatrist

## 2013-11-02 VITALS — BP 115/45 | HR 89 | Resp 16 | Ht 61.0 in | Wt 101.0 lb

## 2013-11-02 DIAGNOSIS — R29898 Other symptoms and signs involving the musculoskeletal system: Secondary | ICD-10-CM

## 2013-11-02 DIAGNOSIS — M79609 Pain in unspecified limb: Secondary | ICD-10-CM

## 2013-11-02 DIAGNOSIS — B351 Tinea unguium: Secondary | ICD-10-CM

## 2013-11-02 MED ORDER — TAVABOROLE 5 % EX SOLN: 1.0000 [drp] | CUTANEOUS | Status: DC

## 2013-11-02 NOTE — Progress Notes (Signed)
HPI:  Patient presents today for follow up of foot and nail care. Denies any new complaints today.  Objective:  Patients chart is reviewed.  Neurovascular status unchanged.  Patients nails are thickened, discolored, distrophic, friable and brittle with yellow-brown discoloration. Patient subjectively relates they are painful with shoes and with ambulation of bilateral feet.  Assessment:  Symptomatic onychomycosis  Plan:  Discussed treatment options and alternatives.  The symptomatic toenails were debrided through manual an mechanical means without complication.  Return appointment recommended at routine intervals of 3 months.  Wrote a prescription for kerydin to apply to fungal toenails.  We will see if we can get it through [facility] as her regular pharmacy was very expensive    Marlowe Aschoff, DPM

## 2013-12-26 ENCOUNTER — Encounter: Payer: Self-pay | Admitting: Neurology

## 2013-12-26 ENCOUNTER — Ambulatory Visit (INDEPENDENT_AMBULATORY_CARE_PROVIDER_SITE_OTHER): Payer: Medicare Other | Admitting: Neurology

## 2013-12-26 ENCOUNTER — Encounter (INDEPENDENT_AMBULATORY_CARE_PROVIDER_SITE_OTHER): Payer: Self-pay

## 2013-12-26 VITALS — BP 173/81 | HR 73 | Ht 60.75 in | Wt 104.0 lb

## 2013-12-26 DIAGNOSIS — G20 Parkinson's disease: Secondary | ICD-10-CM

## 2013-12-26 NOTE — Progress Notes (Signed)
[facility]    Provider:  Dr Janann Colonel Referring Provider: Tommy Medal, MD Primary Care Physician:  Tommy Medal, MD  CC:  Tremor in hands and mouth  HPI:  Dana Klein is a 80 y.o. female here as a follow up from Dr. Minna Antis for tremor in hands and mouth. Her last visit was on 08/2013 at which time her symptoms were most consistent with a diagnosis of parkinsonism and she was started on Sinemet 25-100 TID and instructed to complete PT/rehab. Feels that she was doing well when she was having the physical therapy. States that she is taking the Sinemet 25-100 but often she forgets a dose and sometimes takes them at different times of the day. Has had some falls and gait instability since her last visit.   Initial visit 08/2013: Tremor started around 1 year ago per the patient, involves mouth and bilateral hands. Described only as a rest tremor, unsure for started unilateral does currently bilateral. She denies any action or postural component. She feels it has been progressing over time. For the past few months she has also noticed stiffness and pain in her legs. Described as a muscle cramping sensation. Feels she has slowed down and her walking is not as fast. Feels more unsteady on her feet, trouble turning, has had some falls in the past. Feels her balance is off. Has noticed some change in her writing, feels it is Messier and possibly smaller. She gets up over night frequently to urinate, no history of REM behavior disorder. Notes good sense of smell. No history of exposure to dopamine blocking agents. Was recently started on Requip and Neurontin for possible restless leg syndrome and muscle cramps. Has not noticed much benefit but does note it helps her sleep better at night.  Review of Systems: Out of a complete 14 system review, the patient complains of only the following symptoms, and all other reviewed systems are negative. Positive for light sensitivity, hearing loss,  tremors, easy bruising  History   Social History  . Marital Status: Married    Spouse Name: N/A    Number of Children: 1  . Years of Education: 12   Occupational History  .      Retired   Social History Main Topics  . Smoking status: Never Smoker   . Smokeless tobacco: Never Used  . Alcohol Use: No  . Drug Use: No  . Sexual Activity: Not on file   Other Topics Concern  . Not on file   Social History Narrative   Patient is a widow . Patient is retired and has a Dance movement psychotherapist.   Right handed.   Caffeine- One cup daily coffee.    Family History  Problem Relation Age of Onset  . Lung cancer Mother   . Colon cancer Mother   . Pneumonia Father   . Heart defect Father     Past Medical History  Diagnosis Date  . Back pain   . Osteoarthritis   . Hearing loss   . Bladder incontinence   . Leg numbness   . Numbness of foot   . Thyroid disease     Past Surgical History  Procedure Laterality Date  . Left wrist surgery    . Carpal tunnel release    . Back surgery    . Abdominal hysterectomy      Current Outpatient Prescriptions  Medication Sig Dispense Refill  . acetaminophen (TYLENOL) 500 MG tablet Take 500 mg by mouth  2 (two) times daily.      Marland Kitchen aspirin 81 MG tablet Take 81 mg by mouth daily.        . Calcium Carbonate-Vitamin D (OSCAL 500/200 D-3 PO) Take by mouth 2 (two) times daily.        . carbidopa-levodopa (SINEMET IR) 25-100 MG per tablet Take 1 tablet by mouth 3 (three) times daily.  90 tablet  3  . DULoxetine (CYMBALTA) 20 MG capsule Take 20 mg by mouth daily.      Marland Kitchen gabapentin (NEURONTIN) 300 MG capsule Take 300 capsules by mouth daily.      Marland Kitchen levothyroxine (SYNTHROID, LEVOTHROID) 50 MCG tablet Take 50 mcg by mouth daily before breakfast.      . magnesium oxide (MAG-OX) 400 MG tablet Take 400 mg by mouth daily.      . meloxicam (MOBIC) 15 MG tablet Take 15 mg by mouth daily.      . Misc Natural Products (OSTEO BI-FLEX JOINT SHIELD PO) Take by  mouth.      . Multiple Vitamins-Minerals (CENTRUM SILVER PO) Take by mouth daily.        . Omega-3 Fatty Acids (FISH OIL) 1200 MG CAPS Take 1,200 mg by mouth 2 (two) times daily.        Marland Kitchen rOPINIRole (REQUIP) 1 MG tablet Take 1 mg by mouth daily.      . solifenacin (VESICARE) 10 MG tablet Take by mouth daily.       No current facility-administered medications for this visit.    Allergies as of 12/26/2013 - Review Complete 12/26/2013  Allergen Reaction Noted  . Sulfa antibiotics Rash 06/10/2011    Vitals: BP 173/81  Pulse 73  Ht 5' 0.75" (1.543 m)  Wt 104 lb (47.174 kg)  BMI 19.81 kg/m2 Last Weight:  Wt Readings from Last 1 Encounters:  12/26/13 104 lb (47.174 kg)   Last Height:   Ht Readings from Last 1 Encounters:  12/26/13 5' 0.75" (1.543 m)     Physical exam: Exam: Gen: NAD, conversant Eyes: anicteric sclerae, moist conjunctivae HENT: Atraumatic, oropharynx clear Neck: Trachea midline; supple,  Lungs: CTA, no wheezing, rales, rhonic                          CV: RRR, no MRG Abdomen: Soft, non-tender;  Extremities: No peripheral edema  Skin: Normal temperature, no rash,  Psych: Appropriate affect, pleasant  Neuro: MS: AA&Ox3, appropriately interactive, normal affect   Speech: fluent w/o paraphasic error  Memory: good recent and remote recall  CN: PERRL, EOMI no nystagmus, bilat ptosis, sensation intact to LT V1-V3 bilat, face symmetric, no weakness, hearing grossly intact, palate elevates symmetrically, shoulder shrug 5/5 bilat,  tongue protrudes midline, no fasiculations noted.  Motor: normal bulk and tone Strength: 5/5  In all extremities  Coord: rapid alternating and point-to-point (FNF, HTS) movements intact with mild intention tremor bilat  Reflexes: symmetrical, bilat downgoing toes  Sens: LT intact in all extremities  Brief Motor UPDRS  Speech: hypophonic/dysarthria  Facial Expression: Decreased blink rate Tremor: Rest R 1 L  1 Action/postural R 1 L 1 Rigidity: Mild increase RUE Finger taps:   R: 1 L: 1  Open/close hands: R: 1 L: 1  Foot taps: R: 1 L: 1  Arising from Chair: 1, slow but able to stand without pushing off Gait/FOG: Slow, stooped, slight shuffling, decreased arm swing R>L, re-emergent tremor R hand    Assessment:  After physical and  neurologic examination, review of laboratory studies, imaging, neurophysiology testing and pre-existing records, assessment will be reviewed on the problem list.  Plan:  Treatment plan and additional workup will be reviewed under Problem List.  80 year old woman presenting for follow up evaluation of history of bilateral hand tremor and muscle cramping. Based on clinical history and physical exam her findings are most consistent with a diagnosis of parkinsonism. Based on age and progression of symptoms this most likely represents idiopathic Parkinson's disease versus one of the atypical parkinsonism. Since last visit she has had overall poor compliance with her Sinemet regimen. Counseled patient extensively on the importance of taking her medication on a set regimen. She expressed understanding. Clinically her tremor does appear to be milder. Will not make any medication changes at this time.   1)Parkinsonism  -Continue Sinemet 25-100 IR TID -continue Requip 1mg  qhs for now -follow up in 3 to 4 months

## 2013-12-26 NOTE — Patient Instructions (Signed)
Overall you are doing fairly well but I do want to suggest a few things today:   As far as your medications are concerned, I would like to suggest the following: 1)Please take the Sinemet 25-100 three times a day on a set schedule. Please take it on a set schedule, every 4 hours apart 2)Continue the Requip once nightly  I would like to see you back in 4 months, sooner if we need to. Please call us with any interim questions, concerns, problems, updates or refill requests.   Please also call us for any test results so we can go over those with you on the phone.  My clinical assistant and will answer any of your questions and relay your messages to me and also relay most of my messages to you.   Our phone number is [phone]. We also have an after hours call service for urgent matters and there is a physician on-call for urgent questions. For any emergencies you know to call 911 or go to the nearest emergency room

## 2014-01-23 ENCOUNTER — Other Ambulatory Visit: Payer: Self-pay | Admitting: Neurology

## 2014-01-26 ENCOUNTER — Encounter: Payer: Self-pay | Admitting: Podiatrist

## 2014-01-26 ENCOUNTER — Ambulatory Visit (INDEPENDENT_AMBULATORY_CARE_PROVIDER_SITE_OTHER): Payer: Medicare Other | Admitting: Podiatrist

## 2014-01-26 VITALS — BP 137/64 | HR 68 | Resp 12

## 2014-01-26 DIAGNOSIS — B351 Tinea unguium: Secondary | ICD-10-CM

## 2014-01-26 DIAGNOSIS — M79609 Pain in unspecified limb: Secondary | ICD-10-CM

## 2014-01-26 NOTE — Progress Notes (Signed)
HPI:  Patient presents today for follow up of foot and nail care. Denies any new complaints today.  Objective:  Patients chart is reviewed.  Vascular status reveals pedal pulses noted at 1 out of 4 dp and pt bilateral .  Neurological sensation is Normal to [employer] monofilament bilateral.  Patients nails are thickened, discolored, distrophic, friable and brittle with yellow-brown discoloration. Patient subjectively relates they are painful with shoes and with ambulation of bilateral feet. Corners are ingrown and painful esp bilateral halluces  Assessment:  Symptomatic onychomycosis  Plan:  Discussed treatment options and alternatives.  The symptomatic toenails were debrided through manual an mechanical means without complication.  Return appointment recommended at routine intervals of 3 months    Trudie Buckler, DPM

## 2014-04-25 ENCOUNTER — Encounter: Payer: Self-pay | Admitting: Neurology

## 2014-04-25 ENCOUNTER — Telehealth: Payer: Self-pay | Admitting: *Deleted

## 2014-04-25 ENCOUNTER — Ambulatory Visit (INDEPENDENT_AMBULATORY_CARE_PROVIDER_SITE_OTHER): Payer: Medicare Other | Admitting: Neurology

## 2014-04-25 VITALS — BP 131/70 | HR 80 | Ht 60.75 in | Wt 102.0 lb

## 2014-04-25 DIAGNOSIS — G20 Parkinson's disease: Secondary | ICD-10-CM

## 2014-04-25 DIAGNOSIS — F09 Unspecified mental disorder due to known physiological condition: Secondary | ICD-10-CM

## 2014-04-25 DIAGNOSIS — R4189 Other symptoms and signs involving cognitive functions and awareness: Secondary | ICD-10-CM

## 2014-04-25 NOTE — Patient Instructions (Signed)
Overall you are doing fairly well but I do want to suggest a few things today:   Remember to drink plenty of fluid, eat healthy meals and do not skip any meals. Try to eat protein with a every meal and eat a healthy snack such as fruit or nuts in between meals. Try to keep a regular sleep-wake schedule and try to exercise daily, particularly in the form of walking, 20-30 minutes a day, if you can.   As far as your medications are concerned, I would like to suggest not making any changes at this time  As far as diagnostic testing:  1)I would like you to have your B12 checked, this can be done at the next visit with Dr Minna Antis  I would like to see you back in 4 months, sooner if we need to. Please call us with any interim questions, concerns, problems, updates or refill requests.   My clinical assistant and will answer any of your questions and relay your messages to me and also relay most of my messages to you.   Our phone number is [phone]. We also have an after hours call service for urgent matters and there is a physician on-call for urgent questions. For any emergencies you know to call 911 or go to the nearest emergency room

## 2014-04-25 NOTE — Progress Notes (Signed)
Dana Klein    Provider:  Dr Dana Klein Referring Provider: Tommy Medal, MD Primary Care Physician:  Dana Medal, MD  CC:  Tremor in hands and mouth  HPI:  Dana Klein is a 80 y.o. female here as a follow up from Dr. Minna Klein for tremor in hands and mouth. Her last visit was on 12/2013. Overall stable at prior visit and no medication changes were made. Feels she is overall stable. Notes mild rest tremor but overall doing well. Taking Sinemet 25-100 TID 4 hours apart. No wearing off noted. No dyskinesias. Mild intermittent nausea but no other adverse effects. No difficulty walking, no trips or falls. Notes walking on a regular basis, notes feeling much better when she walks or exercises.   Notes some difficulty with short term memory, forgetting peoples names. Feels it is mild and not bothersome at this time.    Initial visit 08/2013: Tremor started around 1 year ago per the patient, involves mouth and bilateral hands. Described only as a rest tremor, unsure for started unilateral does currently bilateral. She denies any action or postural component. She feels it has been progressing over time. For the past few months she has also noticed stiffness and pain in her legs. Described as a muscle cramping sensation. Feels she has slowed down and her walking is not as fast. Feels more unsteady on her feet, trouble turning, has had some falls in the past. Feels her balance is off. Has noticed some change in her writing, feels it is Messier and possibly smaller. She gets up over night frequently to urinate, no history of REM behavior disorder. Notes good sense of smell. No history of exposure to dopamine blocking agents. Was recently started on Requip and Neurontin for possible restless leg syndrome and muscle cramps. Has not noticed much benefit but does note it helps her sleep better at night.  Review of Systems: Out of a complete 14 system review, the patient complains of only the  following symptoms, and all other reviewed systems are negative. Positive for tremors, cognitive decline  History   Social History  . Marital Status: Married    Spouse Name: N/A    Number of Children: 1  . Years of Education: 12   Occupational History  .      Retired   Social History Main Topics  . Smoking status: Never Smoker   . Smokeless tobacco: Never Used  . Alcohol Use: No  . Drug Use: No  . Sexual Activity: Not on file   Other Topics Concern  . Not on file   Social History Narrative   Patient is a widow . Patient is retired and has a Dance movement psychotherapist.   Right handed.   Caffeine- One cup daily coffee.    Family History  Problem Relation Age of Onset  . Lung cancer Mother   . Colon cancer Mother   . Pneumonia Father   . Heart defect Father     Past Medical History  Diagnosis Date  . Back pain   . Osteoarthritis   . Hearing loss   . Bladder incontinence   . Leg numbness   . Numbness of foot   . Thyroid disease     Past Surgical History  Procedure Laterality Date  . Left wrist surgery    . Carpal tunnel release    . Back surgery    . Abdominal hysterectomy      Current Outpatient Prescriptions  Medication Sig Dispense Refill  .  acetaminophen (TYLENOL) 500 MG tablet Take 500 mg by mouth 2 (two) times daily.      Marland Kitchen aspirin 81 MG tablet Take 81 mg by mouth daily.        . Calcium Carbonate-Vitamin D (OSCAL 500/200 D-3 PO) Take by mouth 2 (two) times daily.        . carbidopa-levodopa (SINEMET IR) 25-100 MG per tablet TAKE 1 TABLET BY MOUTH 3 TIMES A DAY  90 tablet  6  . DULoxetine (CYMBALTA) 20 MG capsule Take 20 mg by mouth daily.      Marland Kitchen gabapentin (NEURONTIN) 300 MG capsule Take 300 capsules by mouth daily.      Marland Kitchen levothyroxine (SYNTHROID, LEVOTHROID) 50 MCG tablet Take 50 mcg by mouth daily before breakfast.      . magnesium oxide (MAG-OX) 400 MG tablet Take 400 mg by mouth daily.      . meloxicam (MOBIC) 15 MG tablet Take 15 mg by mouth  daily.      . Misc Natural Products (OSTEO BI-FLEX JOINT SHIELD PO) Take by mouth.      . Multiple Vitamins-Minerals (CENTRUM SILVER PO) Take by mouth daily.        . Omega-3 Fatty Acids (FISH OIL) 1200 MG CAPS Take 1,200 mg by mouth 2 (two) times daily.        Marland Kitchen rOPINIRole (REQUIP) 1 MG tablet Take 1 mg by mouth daily.      . solifenacin (VESICARE) 10 MG tablet Take by mouth daily.       No current facility-administered medications for this visit.    Allergies as of 04/25/2014 - Review Complete 04/25/2014  Allergen Reaction Noted  . Sulfa antibiotics Rash 06/10/2011    Vitals: BP 131/70  Pulse 80  Ht 5' 0.75" (1.543 m)  Wt 102 lb (46.267 kg)  BMI 19.43 kg/m2 Last Weight:  Wt Readings from Last 1 Encounters:  04/25/14 102 lb (46.267 kg)   Last Height:   Ht Readings from Last 1 Encounters:  04/25/14 5' 0.75" (1.543 m)     Physical exam: Exam: Gen: NAD, conversant Eyes: anicteric sclerae, moist conjunctivae HENT: Atraumatic, oropharynx clear Neck: Trachea midline; supple,  Lungs: CTA, no wheezing, rales, rhonic                          CV: RRR, no MRG Abdomen: Soft, non-tender;  Extremities: No peripheral edema  Skin: Normal temperature, no rash,  Psych: Appropriate affect, pleasant  Neuro: MS: AA&Ox3, appropriately interactive, normal affect   Speech: fluent w/o paraphasic error  Memory: good recent and remote recall  CN: PERRL, EOMI no nystagmus, bilat ptosis, sensation intact to LT V1-V3 bilat, face symmetric, no weakness, hearing grossly intact, palate elevates symmetrically, shoulder shrug 5/5 bilat,  tongue protrudes midline, no fasiculations noted.  Motor: normal bulk and tone Strength: 5/5  In all extremities  Coord: rapid alternating and point-to-point (FNF, HTS) movements intact with mild intention tremor bilat  Reflexes: symmetrical, bilat downgoing toes  Sens: LT intact in all extremities  Brief Motor UPDRS  Speech: mild  hypophonia  Facial Expression: Decreased blink rate Tremor: Rest R 1 L 1 Action/postural R 1 L 1 Rigidity: Mild increase RUE Finger taps:   R: 1 L: 1  Open/close hands: R: 1 L: 1  Foot taps: R: 1 L: 1  Arising from Chair: 1, slow but able to stand without pushing off Gait/FOG: Slow, stooped, slight shuffling, decreased arm swing R>L, re-emergent  tremor R hand    Assessment:  After physical and neurologic examination, review of laboratory studies, imaging, neurophysiology testing and pre-existing records, assessment will be reviewed on the problem list.  Plan:  Treatment plan and additional workup will be reviewed under Problem List.  80 year old woman presenting for follow up evaluation of history of bilateral hand tremor and muscle cramping. Based on clinical history and physical exam her findings are most consistent with a diagnosis of parkinsonism. Based on age and progression of symptoms this most likely represents idiopathic Parkinson's disease versus one of the atypical parkinsonism. Tolerating Sinemet well. Overall doing well, will not make any medication changes at this time. She notes some mild difficulty with short term memory. Will have her B12 checked at next visit with PCP. Check MOCA at next visit. Can consider Aricept in the future.   1)Parkinsonism  -Continue Sinemet 25-100 IR TID -continue Requip 1mg  qhs for now -check B12 level -follow up in 4 months

## 2014-04-27 ENCOUNTER — Ambulatory Visit (INDEPENDENT_AMBULATORY_CARE_PROVIDER_SITE_OTHER): Payer: Medicare Other | Admitting: Podiatrist

## 2014-04-27 ENCOUNTER — Encounter: Payer: Self-pay | Admitting: Podiatrist

## 2014-04-27 VITALS — BP 141/82 | HR 75 | Resp 17 | Ht 60.0 in | Wt 102.0 lb

## 2014-04-27 DIAGNOSIS — B351 Tinea unguium: Secondary | ICD-10-CM

## 2014-04-27 DIAGNOSIS — M79609 Pain in unspecified limb: Secondary | ICD-10-CM

## 2014-05-02 NOTE — Progress Notes (Signed)
HPI: Patient presents today for follow up of foot and nail care. Denies any new complaints today.  Objective: Patients chart is reviewed. Vascular status reveals pedal pulses noted at 1 out of 4 dp and pt bilateral . Neurological sensation is Normal to [employer] monofilament bilateral. Patients nails are thickened, discolored, distrophic, friable and brittle with yellow-brown discoloration. Patient subjectively relates they are painful with shoes and with ambulation of bilateral feet. Corners are ingrown and painful esp bilateral halluces  Assessment: Symptomatic onychomycosis  Plan: Discussed treatment options and alternatives. The symptomatic toenails were debrided through manual an mechanical means without complication. Return appointment recommended at routine intervals of 3 months

## 2014-06-27 ENCOUNTER — Other Ambulatory Visit: Payer: Self-pay | Admitting: Obstetrics and Gynecology

## 2014-06-29 LAB — CYTOLOGY - PAP

## 2014-08-03 ENCOUNTER — Ambulatory Visit: Payer: Medicare Other | Admitting: Podiatrist

## 2014-08-10 ENCOUNTER — Ambulatory Visit (INDEPENDENT_AMBULATORY_CARE_PROVIDER_SITE_OTHER): Payer: Medicare Other | Admitting: Podiatrist

## 2014-08-10 ENCOUNTER — Telehealth: Payer: Self-pay | Admitting: Neurology

## 2014-08-10 DIAGNOSIS — M79609 Pain in unspecified limb: Secondary | ICD-10-CM

## 2014-08-10 DIAGNOSIS — B351 Tinea unguium: Secondary | ICD-10-CM

## 2014-08-10 DIAGNOSIS — M79676 Pain in unspecified toe(s): Principal | ICD-10-CM

## 2014-08-14 NOTE — Progress Notes (Signed)
HPI: Patient presents today for follow up of foot and nail care. Denies any new complaints today.  Objective: Patients chart is reviewed. Vascular status reveals pedal pulses noted at 1 out of 4 dp and pt bilateral . Neurological sensation is Normal to [employer] monofilament bilateral. Patients nails are thickened, discolored, distrophic, friable and brittle with yellow-brown discoloration. Patient subjectively relates they are painful with shoes and with ambulation of bilateral feet. Corners are ingrown and painful esp bilateral halluces  Assessment: Symptomatic onychomycosis  Plan: Discussed treatment options and alternatives. The symptomatic toenails were debrided through manual an mechanical means without complication. Return appointment recommended at routine intervals of 3 months

## 2014-08-29 ENCOUNTER — Encounter: Payer: Self-pay | Admitting: Neurology

## 2014-08-29 ENCOUNTER — Ambulatory Visit (INDEPENDENT_AMBULATORY_CARE_PROVIDER_SITE_OTHER): Payer: Medicare Other | Admitting: Neurology

## 2014-08-29 VITALS — BP 158/74 | HR 72 | Ht 60.0 in | Wt 103.0 lb

## 2014-08-29 DIAGNOSIS — G609 Hereditary and idiopathic neuropathy, unspecified: Secondary | ICD-10-CM

## 2014-08-29 NOTE — Progress Notes (Addendum)
[name]    Provider:  Dr Jaynee Eagles Referring Provider: Tommy Medal, MD Primary Care Physician:  Tommy Medal, MD  CC:  Tremor  HPI:  Dana Klein is a 80 y.o. female here as a followup for Parkinson's disease  [name] patient who was previously seeing Dr. Janann Colonel and is now following up with me. She was referred for tremor and was diagnosed with PD and is on Sinemet 3 times a day.  Took her pill at 8:15 am so it is wearing off now. Tremor significantly improves with the medication. Her handwriting improves with the medication as well.  Doesn't pay attention if she has any significant wearing off unless she is sitting reading but usually she is working outside and doesn't notice a tremor in fact was outside last night and just forgot to take her medication. No dyskinesias. Sometimes will fall over if she doesn't catch herself and "goes over". Using the cane when she goes out. At home doesn't use the cane. In the house she is fine, she can hold on and doesn't need it. Lives alone. Has a life alert necklace. Always wears it. Swallowing is fine, no choking. Sleeping well, uses requip for RLS. No rem sleep disorder. Not tired during the day.Notes some difficulty with short term memory, forgetting peoples names. Feels it is mild and not bothersome at this time  Had back surgery in 2010 and experiencing some low back pain and tightness in her leg muscles below the knees. She had a fracture. Back hurts when she bends over. If she wears tennis shoes, feels like a vice around the feet. No bruning, no numbness, no tingling. Elevating the legs help.   Reviewed notes, labs and imaging from outside physicians, which showed: Initial visit 08/2013, tremor started a year previous both hands (unsure if started in one or other or both) and mostly at rest. Feels walking is slow, unsteady, trouble turning. Good sense of smell.   Has insomnia, is stable. Requip and Neurontin for RLS helps her  sleep.  Recent labs all unremarkable: cbc, crp, cmp, b12  Review of Systems: Patient complains of symptoms per HPI as well as the following symptoms: RLS, constipation, incontinence, urgency, memory loss, joint pain . Pertinent negatives per HPI. All others negative.   History   Social History  . Marital Status: Widowed    Spouse Name: N/A    Number of Children: 1  . Years of Education: 12   Occupational History  .      Retired   Social History Main Topics  . Smoking status: Never Smoker   . Smokeless tobacco: Never Used  . Alcohol Use: No  . Drug Use: No  . Sexual Activity: Not on file   Other Topics Concern  . Not on file   Social History Narrative   Patient is a widow . Patient is retired and has a Dance movement psychotherapist.   Right handed.   Caffeine- One cup daily coffee.    Family History  Problem Relation Age of Onset  . Lung cancer Mother   . Colon cancer Mother   . Pneumonia Father   . Heart defect Father     Past Medical History  Diagnosis Date  . Back pain   . Osteoarthritis   . Hearing loss   . Bladder incontinence   . Leg numbness   . Numbness of foot   . Thyroid disease     Past Surgical History  Procedure Laterality Date  .  Left wrist surgery    . Carpal tunnel release    . Back surgery    . Abdominal hysterectomy      Current Outpatient Prescriptions  Medication Sig Dispense Refill  . acetaminophen (TYLENOL) 500 MG tablet Take 500 mg by mouth 2 (two) times daily.      Marland Kitchen aspirin 81 MG tablet Take 81 mg by mouth every other day. 2 or 3 times weekly      . Calcium Carbonate-Vitamin D (OSCAL 500/200 D-3 PO) Take by mouth 2 (two) times daily.        . carbidopa-levodopa (SINEMET IR) 25-100 MG per tablet TAKE 1 TABLET BY MOUTH 3 TIMES A DAY  90 tablet  6  . DULoxetine (CYMBALTA) 20 MG capsule Take 20 mg by mouth daily.      Marland Kitchen gabapentin (NEURONTIN) 300 MG capsule Take 300 capsules by mouth daily.      Marland Kitchen levothyroxine (SYNTHROID, LEVOTHROID)  50 MCG tablet Take 50 mcg by mouth daily before breakfast.      . magnesium oxide (MAG-OX) 400 MG tablet Take 400 mg by mouth daily.      . meloxicam (MOBIC) 15 MG tablet Take 15 mg by mouth daily.      . Misc Natural Products (OSTEO BI-FLEX JOINT SHIELD PO) Take by mouth.      . Multiple Vitamins-Minerals (CENTRUM SILVER PO) Take by mouth daily.        . Omega-3 Fatty Acids (FISH OIL) 1200 MG CAPS Take 1,200 mg by mouth 2 (two) times daily.        Marland Kitchen rOPINIRole (REQUIP) 1 MG tablet Take 1 mg by mouth daily.      . solifenacin (VESICARE) 10 MG tablet Take by mouth daily.       No current facility-administered medications for this visit.    Allergies as of 08/29/2014 - Review Complete 08/29/2014  Allergen Reaction Noted  . Sulfa antibiotics Rash 06/10/2011    Vitals: BP 158/74  Pulse 72  Ht 5' (1.524 m)  Wt 103 lb (46.72 kg)  BMI 20.12 kg/m2 Last Weight:  Wt Readings from Last 1 Encounters:  08/29/14 103 lb (46.72 kg)   Last Height:   Ht Readings from Last 1 Encounters:  08/29/14 5' (1.524 m)     Physical exam: Exam: Gen: NAD, conversant, well nourised, well groomed                     CV: RRR, no MRG. No Carotid Bruits. No peripheral edema, warm, nontender Eyes: Conjunctivae clear without exudates or hemorrhage  Neuro: Detailed Neurologic Exam  Speech:    Speech is normal; fluent and spontaneous with normal comprehension.  Cognition:    The patient is oriented to person, place, and time;     recent and remote memory intact;     language fluent;     normal attention, concentration,    fund of knowledge Cranial Nerves:    The pupils are equal, round, and reactive to light. The fundi are normal and spontaneous venous pulsations are present. Visual fields are full to finger confrontation. Extraocular movements are intact. Trigeminal sensation is intact and the muscles of mastication are normal. The face is symmetric. The palate elevates in the midline. Voice is normal.  Shoulder shrug is normal. The tongue has normal motion without fasciculations.   Coordination:    No significant impairment rapid alternating movements.   Gait: Slow, stooped, slight shuffling, decreased arm swing R>L, re-emergent tremor  R hand      Motor Observation:    Right tremor > left Tone:    Increased cogwheeling with facilitation right > left.  Posture:    Posture is mildly stooped.     Strength:    Strength is V/V in the upper and lower limbs.      Sensation: intact lt     Reflex Exam:  DTR's:    Deep tendon reflexes in the upper and lower extremities are symmetric bilaterally.   Toes:    The toes are downgoing bilaterally.   Clonus:    Clonus is absent.  Assessment/Plan: [age] year old woman presenting for follow up evaluation of history of bilateral hand tremor and muscle cramping. Based on clinical history and physical exam her findings are most consistent with a diagnosis of parkinsonism. Based on age and progression of symptoms this most likely represents idiopathic Parkinson's disease versus one of the atypical parkinsonism. Tolerating Sinemet well. Overall doing well, will not make any medication changes at this time. She notes some mild difficulty with short term memory. B12 normal. Can consider Aricept in the future.  1)Parkinsonism  -Continue Sinemet 25-100 IR TID  -continue Requip 1mg  qhs for now  -check B12 level : normal - always use walking aid even in the house, fall precautions -follow up in 4 months 2) Memory: monitor clinically 3) insomnia: good sleep hygiene  Sarina Ill, MD  [facility] [address] [place], [place] [address]  Phone [phone] Fax [phone] Lenor Coffin

## 2014-08-29 NOTE — Patient Instructions (Addendum)
Overall you are doing fairly well but I do want to suggest a few things today:   Remember to drink plenty of fluid, eat healthy meals and do not skip any meals. Try to eat protein with a every meal and eat a healthy snack such as fruit or nuts in between meals. Try to keep a regular sleep-wake schedule and try to exercise daily, particularly in the form of walking, 20-30 minutes a day, if you can.   As far as your medications are concerned, I would like to suggest not making any changes at this time   As far as diagnostic testing:  1)I EMG/NCS, please schedule with me within the next 2 weeks.    I would like to see you back in 4 months, sooner if we need to. Please call us with any interim questions, concerns, problems, updates or refill requests.   My clinical assistant and will answer any of your questions and relay your messages to me and also relay most of my messages to you.   Our phone number is [phone]. We also have an after hours call service for urgent matters and there is a physician on-call for urgent questions. For any emergencies you know to call 911 or go to the nearest emergency room

## 2014-09-01 ENCOUNTER — Encounter: Payer: Self-pay | Admitting: Neurology

## 2014-09-05 ENCOUNTER — Ambulatory Visit (INDEPENDENT_AMBULATORY_CARE_PROVIDER_SITE_OTHER): Payer: Medicare Other | Admitting: Neurology

## 2014-09-05 ENCOUNTER — Encounter (INDEPENDENT_AMBULATORY_CARE_PROVIDER_SITE_OTHER): Payer: Self-pay

## 2014-09-05 DIAGNOSIS — G609 Hereditary and idiopathic neuropathy, unspecified: Secondary | ICD-10-CM

## 2014-09-05 DIAGNOSIS — Z0289 Encounter for other administrative examinations: Secondary | ICD-10-CM

## 2014-09-06 ENCOUNTER — Other Ambulatory Visit (INDEPENDENT_AMBULATORY_CARE_PROVIDER_SITE_OTHER): Payer: Self-pay

## 2014-09-06 ENCOUNTER — Other Ambulatory Visit: Payer: Self-pay | Admitting: Neurology

## 2014-09-06 DIAGNOSIS — Z0289 Encounter for other administrative examinations: Secondary | ICD-10-CM

## 2014-09-06 DIAGNOSIS — G609 Hereditary and idiopathic neuropathy, unspecified: Secondary | ICD-10-CM

## 2014-09-08 LAB — IFE AND PE, SERUM
ALBUMIN/GLOB SERPL: 1.6 (ref 0.7–2.0)
Albumin SerPl Elph-Mcnc: 3.9 g/dL (ref 3.2–5.6)
Alpha 1: 0.3 g/dL (ref 0.1–0.4)
Alpha2 Glob SerPl Elph-Mcnc: 0.7 g/dL (ref 0.4–1.2)
B-Globulin SerPl Elph-Mcnc: 0.8 g/dL (ref 0.6–1.3)
GLOBULIN, TOTAL: 2.5 g/dL (ref 2.0–4.5)
Gamma Glob SerPl Elph-Mcnc: 0.8 g/dL (ref 0.5–1.6)
IGA/IMMUNOGLOBULIN A, SERUM: 134 mg/dL (ref 91–414)
IGG (IMMUNOGLOBIN G), SERUM: 742 mg/dL (ref 700–1600)
IGM (IMMUNOGLOBULIN M), SRM: 85 mg/dL (ref 40–230)
TOTAL PROTEIN: 6.4 g/dL (ref 6.0–8.5)

## 2014-09-08 LAB — HEMOGLOBIN A1C
Est. average glucose Bld gHb Est-mCnc: 120 mg/dL
HEMOGLOBIN A1C: 5.8 % — AB (ref 4.8–5.6)

## 2014-09-08 LAB — HEAVY METALS, BLOOD
ARSENIC: 6 ug/L (ref 2–23)
LEAD, BLOOD: 2 ug/dL (ref 0–19)
MERCURY: NOT DETECTED ug/L (ref 0.0–14.9)

## 2014-09-08 LAB — PAN-ANCA
ANCA Proteinase 3: <3.5 U/mL (ref 0.0–3.5)
C-ANCA: <1:20 {titer}

## 2014-09-08 LAB — RHEUMATOID FACTOR: RHEUMATOID FACTOR: 17.1 [IU]/mL — AB (ref 0.0–13.9)

## 2014-09-08 LAB — ANA W/REFLEX: Anti Nuclear Antibody(ANA): NEGATIVE

## 2014-09-08 LAB — SEDIMENTATION RATE: Sed Rate: 2 mm/hr (ref 0–40)

## 2014-09-08 LAB — TSH: TSH: 2.23 u[IU]/mL (ref 0.450–4.500)

## 2014-09-08 LAB — VITAMIN B1, WHOLE BLOOD: THIAMINE: 157.5 nmol/L (ref 66.5–200.0)

## 2014-09-08 LAB — LYME, TOTAL AB TEST/REFLEX: Lyme IgG/IgM Ab: <0.91 {ISR} (ref 0.00–0.90)

## 2014-09-08 LAB — ANGIOTENSIN CONVERTING ENZYME: ANGIO CONVERT ENZYME: 56 U/L (ref 14–82)

## 2014-09-08 LAB — RPR: RPR: NONREACTIVE

## 2014-09-08 LAB — VITAMIN E: VITAMIN E (ALPHA TOCOPHEROL): 12.1 mg/L (ref 4.6–17.8)

## 2014-09-10 NOTE — Progress Notes (Addendum)
[name]    Provider:  Dr Jaynee Eagles Referring Provider: Tommy Medal, MD Primary Care Physician:  Tommy Medal, MD   HPI:  Dana Klein is a 80 y.o. female here as a referral from Dr. Minna Antis for neuropathy. She reports that her feet are cold all the time. Endorses numbness on the bottoms of her feet and a feeling of stiffness in the ankles and feet. Symptoms started in 2010 and have been slowly worsening. She feels her balance is poor especially on uneven surfaces. Denies falls. Denies tingling or burning. No radicular symptoms.   Summary: Nerve conduction studies were performed on the right upper and bilateral lower extremities.   The right Median motor nerve showed prolonged latency (4.45ms, N<4.0) with normal F wave latency.  The right 2nd-digit Median sensory nerve showed prolonged latency (4.5ms, N<3.9)   The right Ulnar motor nerve was within normal limits with normal F wave latency. The right 5th-digit Ulnar sensory nerve was within normal limits  The right Peroneal motor nerve showed reduced amplitude (0.68mV, N>2) with prolonged F wave latency(72ms, N<56) The left Peroneal motor nerve showed reduced amplitude (0.47mV, N>2) with prolonged F wave latency(35ms, N<56)  The right Tibial nerve showed reduced amplitude (0.60mV, N>3) with prolonged F wave latency(78ms, N<58) The left Tibial motor nerve showed reduced amplitude (0.11mV, N>3) with prolonged F wave latency(20ms, N<58)  The bilateral Peroneal and Sural sensory nerves showed no response.  The bilateral H Waves showed no response  EMG needle study was performed on selected lower extremity muscles:  The right Vastus Medialis muscle showed increased motor unit amplitude and diminished recruitment. The right Anterior Tibialis muscle showed markedly increased spontaneous activity, increased motor unit amplitude, increased motor unit duration, increased polyphasic potentials and diminished recruitment.The  right medial gastrocnemius muscle showed moderately increased spontaneous activity, increased motor unit amplitude, increased motor unit duration and diminished recruitment. The right Extensor Hallucis Longus muscle showed slightly increased spontaneous activity, increased motor unit amplitude and diminished recruitment. The right Abductor Hallucis muscle showed decreased insertional activity, increased motor unit amplitude, increased motor unit duration, and diminished recruitment.  The left Vastus Medialis muscle was within normal limits. The left Anterior Tibialis muscle showed moderately increased spontaneous activity, increased motor unit amplitude, increased motor unit duration, and diminished recruitment.The left medial gastrocnemius muscle showed moderately increased spontaneous activity, increased motor unit amplitude, increased motor unit duration and diminished recruitment. The left Extensor Hallucis Longus muscle showed decreased insertional activity, increased motor unit amplitude, increased motor unit duration and diminished recruitment. The left Abductor Hallucis muscle showed decreased insertional activity, increased motor unit duration, and diminished recruitment.    Assessment/Plan: There is electrophysiologic evidence of a symmetric length-dependent severe axonal sensorimotor polyneuropathy. In addition, there is evidence of a moderately-severe right median nerve entrapment at the wrist. Decreased insertional activity of distal leg muscles could be due to atrophy. Clinical correlation suggested.  Dana Klein was a patient of Dr. Janann Colonel and has recently been transferred to my care at [facility] due to Dr. Hazle Quant departure. She was referred for tremor and was previously diagnosed with PD and is on Sinemet 3 times a day. I have ordered a full serum workup to evaluate for causes of neuropathy. I will also see Dana Klein for follow-up appointment to evaluate her further for  this severe axonal neuropathy.    Sarina Ill, MD  [facility]  [address] [place]  [place], [place] [address]  Phone [phone] Fax [phone]  Lenor Coffin

## 2014-09-18 ENCOUNTER — Telehealth: Payer: Self-pay | Admitting: Neurology

## 2014-09-18 NOTE — Telephone Encounter (Signed)
Patient requesting Blood work results.  Please call and advise.

## 2014-09-27 ENCOUNTER — Other Ambulatory Visit: Payer: Self-pay | Admitting: Neurology

## 2014-09-27 DIAGNOSIS — G609 Hereditary and idiopathic neuropathy, unspecified: Secondary | ICD-10-CM

## 2014-09-27 DIAGNOSIS — R7689 Other specified abnormal immunological findings in serum: Secondary | ICD-10-CM

## 2014-09-27 DIAGNOSIS — M2550 Pain in unspecified joint: Secondary | ICD-10-CM

## 2014-09-27 DIAGNOSIS — R768 Other specified abnormal immunological findings in serum: Secondary | ICD-10-CM

## 2014-09-27 NOTE — Progress Notes (Signed)
Dana Klein - Would you call this patient and let her know that I placed a Rheumatology consult for her to evaluate her arthritis and elevated rheumatoid factor.If she has questions I can call her back. Thank you

## 2014-10-03 ENCOUNTER — Telehealth: Payer: Self-pay | Admitting: Neurology

## 2014-10-03 NOTE — Telephone Encounter (Signed)
Patient calling to request a call from Dr. Jaynee Eagles in order to discuss her rheumatology referral, please return call and advise.

## 2014-10-06 NOTE — Telephone Encounter (Signed)
Called patient back. Left a message. Her rheumatoid factor was elevated and she has severe arthritis. Discussed ordering some confirmatory tests before sending her to Rheumatology however patient wanted me to find out how much the lab tests would be and if it would be cheaper than her actually going to see a rheumatologist. Unfortunately the labs are through [facility] and we have no control of that. So I recommend she sees rheumatology so they can evaluate for appropriate follow up. She has been asking to see rheumatology for years per patient.

## 2014-11-02 ENCOUNTER — Ambulatory Visit (INDEPENDENT_AMBULATORY_CARE_PROVIDER_SITE_OTHER): Payer: Medicare Other | Admitting: Podiatrist

## 2014-11-02 ENCOUNTER — Encounter: Payer: Self-pay | Admitting: Podiatrist

## 2014-11-02 DIAGNOSIS — M79676 Pain in unspecified toe(s): Secondary | ICD-10-CM

## 2014-11-02 DIAGNOSIS — B351 Tinea unguium: Secondary | ICD-10-CM

## 2014-11-02 NOTE — Progress Notes (Signed)
HPI: Patient presents today for follow up of foot and nail care. Denies any new complaints today.  Objective: Patients chart is reviewed. Vascular status reveals pedal pulses noted at 1 out of 4 dp and pt bilateral . Neurological sensation is Normal to [employer] monofilament bilateral. Patients nails are thickened, discolored, distrophic, friable and brittle with yellow-brown discoloration. Patient subjectively relates they are painful with shoes and with ambulation of bilateral feet. Corners are ingrown and painful esp bilateral halluces  Assessment: Symptomatic onychomycosis  Plan: Discussed treatment options and alternatives. The symptomatic toenails were debrided through manual an mechanical means without complication. Return appointment recommended at routine intervals of 3 months

## 2015-01-01 ENCOUNTER — Ambulatory Visit: Payer: Medicare Other | Admitting: Neurology

## 2015-01-17 ENCOUNTER — Encounter: Payer: Self-pay | Admitting: Neurology

## 2015-01-17 ENCOUNTER — Telehealth: Payer: Self-pay | Admitting: Neurology

## 2015-01-17 ENCOUNTER — Ambulatory Visit (INDEPENDENT_AMBULATORY_CARE_PROVIDER_SITE_OTHER): Payer: PPO | Admitting: Neurology

## 2015-01-17 VITALS — BP 155/76 | HR 71 | Ht 60.0 in | Wt 99.5 lb

## 2015-01-17 DIAGNOSIS — G250 Essential tremor: Secondary | ICD-10-CM | POA: Diagnosis not present

## 2015-01-17 DIAGNOSIS — G609 Hereditary and idiopathic neuropathy, unspecified: Secondary | ICD-10-CM

## 2015-01-17 MED ORDER — PROPRANOLOL HCL ER 80 MG PO CP24: 80.0000 mg | ORAL_CAPSULE | Freq: Every day | ORAL | Status: DC

## 2015-01-17 NOTE — Progress Notes (Signed)
[name]    Provider:  Dr Jaynee Eagles Referring Provider: Tommy Medal, MD Primary Care Physician:  Tommy Medal, MD  CC:  PD and neuropathy  HPI:  Dana Klein is a 81 y.o. female here as a follow up.  She worked out in the yard and is having pain in the anterior legs likely from overuse. She is a little slow, she hasn't been walking outside as much. Now that the weather is warm she can go back. Daughter denies hypophonia, changes in handwriting. She has hearing difficulty and usually talks too loud. Daughter feels expression is normal but family members have noticed the mild mouth tremor. Sinemet does not help her symptoms.   Emg/ncs 10/20: There is electrophysiologic evidence of a symmetric length-dependent severe axonal sensorimotor polyneuropathy. In addition, there is evidence of a moderately-severe right median nerve entrapment at the wrist. Decreased insertional activity of distal leg muscles could be due to atrophy. Clinical correlation suggested   Initial visit 08/29/2014: [name] patient who was previously seeing Dr. Janann Colonel and is now following up with me. She was referred for tremor and was diagnosed with PD and is on Sinemet 3 times a day. Took her pill at 8:15 am so it is wearing off now. Tremor significantly improves with the medication. Her handwriting improves with the medication as well. Doesn't pay attention if she has any significant wearing off unless she is sitting reading but usually she is working outside and doesn't notice a tremor in fact was outside last night and just forgot to take her medication. No dyskinesias. Sometimes will fall over if she doesn't catch herself and "goes over". Using the cane when she goes out. At home doesn't use the cane. In the house she is fine, she can hold on and doesn't need it. Lives alone. Has a life alert necklace. Always wears it. Swallowing is fine, no choking. Sleeping well, uses requip for RLS. No rem sleep  disorder. Not tired during the day.Notes some difficulty with short term memory, forgetting peoples names. Feels it is mild and not bothersome at this time  Had back surgery in 2010 and experiencing some low back pain and tightness in her leg muscles below the knees. She had a fracture. Back hurts when she bends over. If she wears tennis shoes, feels like a vice around the feet. No bruning, no numbness, no tingling. Elevating the legs help.   Reviewed notes, labs and imaging from outside physicians, which showed: Initial visit 08/2013, tremor started a year previous both hands (unsure if started in one or other or both) and mostly at rest. Feels walking is slow, unsteady, trouble turning. Good sense of smell.   Has insomnia, is stable. Requip and Neurontin for RLS helps her sleep. Recent labs all unremarkable: cbc, crp, cmp, b12   Review of Systems: Patient complains of symptoms per HPI as well as the following symptoms: RLS, constipation, incontinence, urgency, memory loss, joint pain, aching muscles, tremors . Pertinent negatives per HPI. All others negative.    History   Social History  . Marital Status: Widowed    Spouse Name: N/A  . Number of Children: 1  . Years of Education: 12   Occupational History  .      Retired   Social History Main Topics  . Smoking status: Never Smoker   . Smokeless tobacco: Never Used  . Alcohol Use: No  . Drug Use: No  . Sexual Activity: Not on file   Other Topics Concern  .  Not on file   Social History Narrative   Patient is a widow . Patient is retired and has a Dance movement psychotherapist.   Right handed.   Caffeine- One cup daily coffee.    Family History  Problem Relation Age of Onset  . Lung cancer Mother   . Colon cancer Mother   . Pneumonia Father   . Heart defect Father     Past Medical History  Diagnosis Date  . Back pain   . Osteoarthritis   . Hearing loss   . Bladder incontinence   . Leg numbness   . Numbness of foot     . Thyroid disease     Past Surgical History  Procedure Laterality Date  . Left wrist surgery    . Carpal tunnel release    . Back surgery    . Abdominal hysterectomy      Current Outpatient Prescriptions  Medication Sig Dispense Refill  . acetaminophen (TYLENOL) 500 MG tablet Take 500 mg by mouth 2 (two) times daily.    Marland Kitchen aspirin 81 MG tablet Take 81 mg by mouth every other day. 2 or 3 times weekly    . Calcium Carbonate-Vitamin D (OSCAL 500/200 D-3 PO) Take by mouth 2 (two) times daily.      . carbidopa-levodopa (SINEMET IR) 25-100 MG per tablet TAKE 1 TABLET BY MOUTH 3 TIMES A DAY 90 tablet 3  . DULoxetine (CYMBALTA) 30 MG capsule Take 30 mg by mouth daily.  12  . gabapentin (NEURONTIN) 300 MG capsule Take 300 capsules by mouth daily.    Marland Kitchen levothyroxine (SYNTHROID, LEVOTHROID) 50 MCG tablet Take 50 mcg by mouth daily before breakfast.    . magnesium oxide (MAG-OX) 400 MG tablet Take 400 mg by mouth daily.    . meloxicam (MOBIC) 15 MG tablet Take 15 mg by mouth daily.    . Misc Natural Products (OSTEO BI-FLEX JOINT SHIELD PO) Take by mouth.    . Multiple Vitamins-Minerals (CENTRUM SILVER PO) Take by mouth daily.      . Omega-3 Fatty Acids (FISH OIL) 1200 MG CAPS Take 1,200 mg by mouth 2 (two) times daily.      Marland Kitchen rOPINIRole (REQUIP) 1 MG tablet Take 1 mg by mouth daily.    . solifenacin (VESICARE) 10 MG tablet Take by mouth daily.     No current facility-administered medications for this visit.    Allergies as of 01/17/2015 - Review Complete 01/17/2015  Allergen Reaction Noted  . Sulfa antibiotics Rash 06/10/2011    Vitals: BP 155/76 mmHg  Pulse 71  Ht 5' (1.524 m)  Wt 99 lb 8 oz (45.133 kg)  BMI 19.43 kg/m2 Last Weight:  Wt Readings from Last 1 Encounters:  01/17/15 99 lb 8 oz (45.133 kg)   Last Height:   Ht Readings from Last 1 Encounters:  01/17/15 5' (1.524 m)    Physical exam: Exam: Gen: NAD, conversant, well nourised, well groomed  CV:  RRR, no MRG. No Carotid Bruits. No peripheral edema, warm, nontender Eyes: Conjunctivae clear without exudates or hemorrhage  Neuro: Detailed Neurologic Exam  Speech:  Speech is normal; fluent and spontaneous with normal comprehension.  Cognition:  The patient is oriented to person, place, and time;   recent and remote memory intact;   language fluent;   normal attention, concentration, fund of knowledge Cranial Nerves:  The pupils are equal, round, and reactive to light. The fundi are normal and spontaneous venous pulsations are present. Visual fields are  full to finger confrontation. Extraocular movements are intact. Trigeminal sensation is intact and the muscles of mastication are normal. The face is symmetric. The palate elevates in the midline. Voice is normal. Hearing impaired Shoulder shrug is normal. The tongue has normal motion without fasciculations.   Coordination:  No significant impairment rapid alternating movements.   Gait: Slow, stooped, slight shuffling, decreased arm swing R>L, re-emergent tremor R hand     Motor Observation:  Right tremor > left, resting with postural and intention components Tone:  Increased cogwheeling with facilitation right > left.  Posture:  Posture is mildly stooped.    Strength:  Strength is V/V in the upper and lower limbs.     Assessment/Plan: [age] year old woman presenting for follow up evaluation of history of bilateral hand tremor and muscle cramping. Based on clinical history and physical exam her findings are most consistent with a diagnosis of parkinsonism and she was started on Sinemet by Dr. Janann Colonel. However dopamine challenge in the office failed to produce improvement in any symptoms.   - May be misdiagnosed essential tremor, will try propranolol instead.  -continue Requip 1mg  qhs for RLS -check B12 level : normal - always use walking aid even in the house, fall precautions -  Memory: monitor clinically - insomnia: good sleep hygiene - Neuropathy: neurontin tid  Sarina Ill, MD  [facility] [address] [place], [place] [address]  Phone [phone] Fax [phone]  A total of 90 minutes was spent face-to-face with this patient. Over half this time was spent on counseling patient on the tremor diagnosis and different diagnostic and therapeutic options available.

## 2015-01-17 NOTE — Telephone Encounter (Signed)
Patient was started on Sinemet after first seeing Dr. Janann Colonel in 2014. Since then patient has not noticed worsening of her symptoms and she has not noticed improvement of tremor on Sinemet. Asked patient o bring in her noon dose today in the office and evaluated her before and after administration or Sinemet. He normal dose of 25/100 did not improve symptoms after an hour so patient took a second 25-100 pill which also did not change any of her symptoms, Will discontinue Sinemet and try propranolol for essential tremor. Patient reports no cardiac disease and her BP and pulse are sufficient in the office today. Will try Propranolol 80mg  ER and see if this helps but asked patient to just check in with Dr. Minna Antis to ensure he is OK with trying a b-blocker as she is seeing him tomorrow.

## 2015-01-17 NOTE — Patient Instructions (Signed)
Overall you are doing fairly well but I do want to suggest a few things today:   Remember to drink plenty of fluid, eat healthy meals and do not skip any meals. Try to eat protein with a every meal and eat a healthy snack such as fruit or nuts in between meals. Try to keep a regular sleep-wake schedule and try to exercise daily, particularly in the form of walking, 20-30 minutes a day, if you can.   As far as your medications are concerned, I would like to suggest: discontinue sinemet, start propranolol  I would like to see you back in 4 weeks, sooner if we need to. Please call us with any interim questions, concerns, problems, updates or refill requests.   Please also call us for any test results so we can go over those with you on the phone.  My clinical assistant and will answer any of your questions and relay your messages to me and also relay most of my messages to you.   Our phone number is [phone]. We also have an after hours call service for urgent matters and there is a physician on-call for urgent questions. For any emergencies you know to call 911 or go to the nearest emergency room

## 2015-01-17 NOTE — Telephone Encounter (Signed)
Patient's daughter Estill Bamberg is calling to advise that after the patient took an extra Sinemet it did not help with her tremors. Please call. Thank you.

## 2015-02-01 ENCOUNTER — Encounter: Payer: Self-pay | Admitting: Podiatrist

## 2015-02-01 ENCOUNTER — Ambulatory Visit (INDEPENDENT_AMBULATORY_CARE_PROVIDER_SITE_OTHER): Payer: PPO | Admitting: Podiatrist

## 2015-02-01 DIAGNOSIS — M79676 Pain in unspecified toe(s): Secondary | ICD-10-CM

## 2015-02-01 DIAGNOSIS — G629 Polyneuropathy, unspecified: Secondary | ICD-10-CM

## 2015-02-01 DIAGNOSIS — B351 Tinea unguium: Secondary | ICD-10-CM | POA: Diagnosis not present

## 2015-02-01 NOTE — Progress Notes (Signed)
HPI: Patient presents today for follow up of foot and nail care. Was recently diagnosed with essential tremors not parkinsons by dr. Jaynee Eagles-- she was also diagnosed with neuropathy in her legs and states her toes are hard to separate on the right foot especially  Objective: Patients chart is reviewed. Vascular status reveals pedal pulses noted at 1 out of 4 dp and pt bilateral . Neurological sensation is Normal to [employer] monofilament bilateral. Patients nails are thickened, discolored, distrophic, friable and brittle with yellow-brown discoloration. Patient subjectively relates they are painful with shoes and with ambulation of bilateral feet. Corners are ingrown and painful esp bilateral halluces   Assessment: Symptomatic onychomycosis , neuropathy  Plan: Discussed treatment options and alternatives. The symptomatic toenails were debrided through manual an mechanical means without complication. Return appointment recommended at routine intervals of 3 months

## 2015-02-02 ENCOUNTER — Emergency Department (INDEPENDENT_AMBULATORY_CARE_PROVIDER_SITE_OTHER)
Admission: EM | Admit: 2015-02-02 | Discharge: 2015-02-02 | Disposition: A | Discharge status: Home / Self Care | Payer: PPO | Source: Home / Self Care | Attending: Emergency Medicine | Admitting: Emergency Medicine

## 2015-02-02 ENCOUNTER — Encounter (HOSPITAL_COMMUNITY): Payer: Self-pay | Admitting: *Deleted

## 2015-02-02 DIAGNOSIS — T887XXA Unspecified adverse effect of drug or medicament, initial encounter: Secondary | ICD-10-CM | POA: Diagnosis not present

## 2015-02-02 DIAGNOSIS — R21 Rash and other nonspecific skin eruption: Secondary | ICD-10-CM | POA: Diagnosis not present

## 2015-02-02 DIAGNOSIS — T50905A Adverse effect of unspecified drugs, medicaments and biological substances, initial encounter: Secondary | ICD-10-CM

## 2015-02-02 LAB — POCT I-STAT, CHEM 8
BUN: 30 mg/dL — ABNORMAL HIGH (ref 6–23)
CREATININE: 0.8 mg/dL (ref 0.50–1.10)
Calcium, Ion: 1.25 mmol/L (ref 1.13–1.30)
Chloride: 99 mmol/L (ref 96–112)
Glucose, Bld: 107 mg/dL — ABNORMAL HIGH (ref 70–99)
HCT: 42 % (ref 36.0–46.0)
HEMOGLOBIN: 14.3 g/dL (ref 12.0–15.0)
POTASSIUM: 4.6 mmol/L (ref 3.5–5.1)
Sodium: 131 mmol/L — ABNORMAL LOW (ref 135–145)
TCO2: 23 mmol/L (ref 0–100)

## 2015-02-02 LAB — CBC WITH DIFFERENTIAL/PLATELET
BASOS PCT: 0 % (ref 0–1)
Basophils Absolute: 0 10*3/uL (ref 0.0–0.1)
Eosinophils Absolute: 0.4 10*3/uL (ref 0.0–0.7)
Eosinophils Relative: 6 % — ABNORMAL HIGH (ref 0–5)
HCT: 38.6 % (ref 36.0–46.0)
HEMOGLOBIN: 13 g/dL (ref 12.0–15.0)
Lymphocytes Relative: 12 % (ref 12–46)
Lymphs Abs: 0.9 10*3/uL (ref 0.7–4.0)
MCH: 31.7 pg (ref 26.0–34.0)
MCHC: 33.7 g/dL (ref 30.0–36.0)
MCV: 94.1 fL (ref 78.0–100.0)
Monocytes Absolute: 0.4 10*3/uL (ref 0.1–1.0)
Monocytes Relative: 5 % (ref 3–12)
NEUTROS ABS: 5.5 10*3/uL (ref 1.7–7.7)
NEUTROS PCT: 77 % (ref 43–77)
Platelets: 241 10*3/uL (ref 150–400)
RBC: 4.1 MIL/uL (ref 3.87–5.11)
RDW: 13.5 % (ref 11.5–15.5)
WBC: 7.1 10*3/uL (ref 4.0–10.5)

## 2015-02-02 MED ORDER — METHYLPREDNISOLONE 4 MG PO KIT: PACK | ORAL | Status: DC

## 2015-02-02 MED ORDER — HYDROXYZINE HCL 10 MG PO TABS: 10.0000 mg | ORAL_TABLET | ORAL | Status: DC | PRN

## 2015-02-02 NOTE — ED Provider Notes (Signed)
CSN: [id number]     Arrival date & time 02/02/15  1528 History   First MD Initiated Contact with Patient 02/02/15 1657     Chief Complaint  Patient presents with  . Rash   (Consider location/radiation/quality/duration/timing/severity/associated sxs/prior Treatment) HPI        81 year old female presents for evaluation of a rash. This rash started yesterday on her legs and has since spread to both of her arms as well as around her buttocks. It is red and itchy. She has never had a rash like this before. She denies any other systemic symptoms. Her only new medication is propranolol which was started about 2 weeks ago. No history of bleeding disorders or thrombocytopenia.  Past Medical History  Diagnosis Date  . Back pain   . Osteoarthritis   . Hearing loss   . Bladder incontinence   . Leg numbness   . Numbness of foot   . Thyroid disease    Past Surgical History  Procedure Laterality Date  . Left wrist surgery    . Carpal tunnel release    . Back surgery    . Abdominal hysterectomy     Family History  Problem Relation Age of Onset  . Lung cancer Mother   . Colon cancer Mother   . Pneumonia Father   . Heart defect Father   . Diabetes Sister   . Diabetes Brother   . Diabetes Sister   . Diabetes Brother    History  Substance Use Topics  . Smoking status: Never Smoker   . Smokeless tobacco: Never Used  . Alcohol Use: No   OB History    No data available     Review of Systems  Skin: Positive for rash (see HPI).  All other systems reviewed and are negative.   Allergies  Sulfa antibiotics  Home Medications   Prior to Admission medications   Medication Sig Start Date End Date Taking? Authorizing Provider  acetaminophen (TYLENOL) 500 MG tablet Take 500 mg by mouth 2 (two) times daily.   Yes Historical Provider, MD  aspirin 81 MG tablet Take 81 mg by mouth every other day. 2 or 3 times weekly   Yes Historical Provider, MD  Calcium Carbonate-Vitamin D (OSCAL 500/200  D-3 PO) Take by mouth 2 (two) times daily.     Yes Historical Provider, MD  DULoxetine (CYMBALTA) 30 MG capsule Take 30 mg by mouth daily. 12/20/14  Yes Historical Provider, MD  gabapentin (NEURONTIN) 300 MG capsule Take 300 capsules by mouth daily. 08/18/13  Yes Historical Provider, MD  levothyroxine (SYNTHROID, LEVOTHROID) 50 MCG tablet Take 50 mcg by mouth daily before breakfast.   Yes Historical Provider, MD  magnesium oxide (MAG-OX) 400 MG tablet Take 400 mg by mouth daily.   Yes Historical Provider, MD  meloxicam (MOBIC) 15 MG tablet Take 15 mg by mouth daily.   Yes Historical Provider, MD  Misc Natural Products (OSTEO BI-FLEX JOINT SHIELD PO) Take by mouth.   Yes Historical Provider, MD  Multiple Vitamins-Minerals (CENTRUM SILVER PO) Take by mouth daily.     Yes Historical Provider, MD  Omega-3 Fatty Acids (FISH OIL) 1200 MG CAPS Take 1,200 mg by mouth 2 (two) times daily.     Yes Historical Provider, MD  propranolol ER (INDERAL LA) 80 MG 24 hr capsule Take 1 capsule (80 mg total) by mouth daily. 01/17/15  Yes Melvenia Beam, MD  rOPINIRole (REQUIP) 1 MG tablet Take 1 mg by mouth daily. 08/18/13  Yes  Historical Provider, MD  solifenacin (VESICARE) 10 MG tablet Take by mouth daily.   Yes Historical Provider, MD  carbidopa-levodopa (SINEMET IR) 25-100 MG per tablet Take 1 tablet by mouth 3 (three) times daily. 12/13/14   Historical Provider, MD  hydrOXYzine (ATARAX/VISTARIL) 10 MG tablet Take 1 tablet (10 mg total) by mouth every 4 (four) hours as needed for itching. 02/02/15   Liam Graham, PA-C  methylPREDNISolone (MEDROL DOSEPAK) 4 MG tablet Use as directed on package instructions 02/02/15   Liam Graham, PA-C   BP 165/74 mmHg  Pulse 62  Temp(Src) 97.5 F (36.4 C) (Oral)  Resp 14  SpO2 99% Physical Exam  Constitutional: She is oriented to person, place, and time. Vital signs are normal. She appears well-developed and well-nourished. No distress.  HENT:  Head: Normocephalic and  atraumatic.  Mouth/Throat: Oropharynx is clear and moist.  No mucosal involvement  Cardiovascular: Normal rate, regular rhythm and normal heart sounds.   Pulmonary/Chest: Effort normal and breath sounds normal. No respiratory distress.  Neurological: She is alert and oriented to person, place, and time. She has normal strength. Coordination normal.  Skin: Skin is warm and dry. Rash noted. Rash is macular (macular/petechial rash on the extremities as well as around the buttocks, nonpalpable, non-blanching). She is not diaphoretic.  Psychiatric: She has a normal mood and affect. Judgment normal.  Nursing note and vitals reviewed.   ED Course  Procedures (including critical care time) Labs Review Labs Reviewed  CBC WITH DIFFERENTIAL/PLATELET - Abnormal; Notable for the following:    Eosinophils Relative 6 (*)    All other components within normal limits  POCT I-STAT, CHEM 8 - Abnormal; Notable for the following:    Sodium 131 (*)    BUN 30 (*)    Glucose, Bld 107 (*)    All other components within normal limits    Imaging Review No results found.   MDM   1. Rash   2. Adverse drug reaction, initial encounter    Patient seen in conjunction with the attending. This is most likely an adverse drug reaction to the propranolol. She will stop the propranolol and call her neurologist to see about taking a different medication for her tremors. I will give her a prescription for Medrol Dosepak as well as 10 mg hydroxyzine to take for itching. Follow-up when necessary   Meds ordered this encounter  Medications  . hydrOXYzine (ATARAX/VISTARIL) 10 MG tablet    Sig: Take 1 tablet (10 mg total) by mouth every 4 (four) hours as needed for itching.    Dispense:  20 tablet    Refill:  0  . methylPREDNISolone (MEDROL DOSEPAK) 4 MG tablet    Sig: Use as directed on package instructions    Dispense:  21 tablet    Refill:  Milroy Itzia Cunliffe, PA-C 02/02/15 1845

## 2015-02-02 NOTE — ED Notes (Signed)
Rash on arms and legs, one on chest and face and L buttock onset Wednesday.  She tried Aveeno lotion and clobetasol cream.

## 2015-02-02 NOTE — Discharge Instructions (Signed)
Allergies Allergies may happen from anything your body is sensitive to. This may be food, medicines, pollens, chemicals, and nearly anything around you in everyday life that produces allergens. An allergen is anything that causes an allergy producing substance. Heredity is often a factor in causing these problems. This means you may have some of the same allergies as your parents. Food allergies happen in all age groups. Food allergies are some of the most severe and life threatening. Some common food allergies are cow's milk, seafood, eggs, nuts, wheat, and soybeans. SYMPTOMS   Swelling around the mouth.  An itchy red rash or hives.  Vomiting or diarrhea.  Difficulty breathing. SEVERE ALLERGIC REACTIONS ARE LIFE-THREATENING. This reaction is called anaphylaxis. It can cause the mouth and throat to swell and cause difficulty with breathing and swallowing. In severe reactions only a trace amount of food (for example, peanut oil in a salad) may cause death within seconds. Seasonal allergies occur in all age groups. These are seasonal because they usually occur during the same season every year. They may be a reaction to molds, grass pollens, or tree pollens. Other causes of problems are house dust mite allergens, pet dander, and mold spores. The symptoms often consist of nasal congestion, a runny itchy nose associated with sneezing, and tearing itchy eyes. There is often an associated itching of the mouth and ears. The problems happen when you come in contact with pollens and other allergens. Allergens are the particles in the air that the body reacts to with an allergic reaction. This causes you to release allergic antibodies. Through a chain of events, these eventually cause you to release histamine into the blood stream. Although it is meant to be protective to the body, it is this release that causes your discomfort. This is why you were given anti-histamines to feel better. If you are unable to  pinpoint the offending allergen, it may be determined by skin or blood testing. Allergies cannot be cured but can be controlled with medicine. Hay fever is a collection of all or some of the seasonal allergy problems. It may often be treated with simple over-the-counter medicine such as diphenhydramine. Take medicine as directed. Do not drink alcohol or drive while taking this medicine. Check with your caregiver or package insert for child dosages. If these medicines are not effective, there are many new medicines your caregiver can prescribe. Stronger medicine such as nasal spray, eye drops, and corticosteroids may be used if the first things you try do not work well. Other treatments such as immunotherapy or desensitizing injections can be used if all else fails. Follow up with your caregiver if problems continue. These seasonal allergies are usually not life threatening. They are generally more of a nuisance that can often be handled using medicine. HOME CARE INSTRUCTIONS   If unsure what causes a reaction, keep a diary of foods eaten and symptoms that follow. Avoid foods that cause reactions.  If hives or rash are present:  Take medicine as directed.  You may use an over-the-counter antihistamine (diphenhydramine) for hives and itching as needed.  Apply cold compresses (cloths) to the skin or take baths in cool water. Avoid hot baths or showers. Heat will make a rash and itching worse.  If you are severely allergic:  Following a treatment for a severe reaction, hospitalization is often required for closer follow-up.  Wear a medic-alert bracelet or necklace stating the allergy.  You and your family must learn how to give adrenaline or use  an anaphylaxis kit.  If you have had a severe reaction, always carry your anaphylaxis kit or EpiPen with you. Use this medicine as directed by your caregiver if a severe reaction is occurring. Failure to do so could have a fatal outcome. SEEK MEDICAL  CARE IF:  You suspect a food allergy. Symptoms generally happen within 30 minutes of eating a food.  Your symptoms have not gone away within 2 days or are getting worse.  You develop new symptoms.  You want to retest yourself or your child with a food or drink you think causes an allergic reaction. Never do this if an anaphylactic reaction to that food or drink has happened before. Only do this under the care of a caregiver. SEEK IMMEDIATE MEDICAL CARE IF:   You have difficulty breathing, are wheezing, or have a tight feeling in your chest or throat.  You have a swollen mouth, or you have hives, swelling, or itching all over your body.  You have had a severe reaction that has responded to your anaphylaxis kit or an EpiPen. These reactions may return when the medicine has worn off. These reactions should be considered life threatening. MAKE SURE YOU:   Understand these instructions.  Will watch your condition.  Will get help right away if you are not doing well or get worse. Document Released: 01/27/2003 Document Revised: 02/28/2013 Document Reviewed: 07/03/2008 [employer] Patient Information 2015 [place], [place]. This information is not intended to replace advice given to you by your health care provider. Make sure you discuss any questions you have with your health care provider.  Drug Allergy Allergic reactions to medicines are common. Some allergic reactions are mild. A delayed type of drug allergy that occurs 1 week or more after exposure to a medicine or vaccine is called serum sickness. A life-threatening, sudden (acute) allergic reaction that involves the whole body is called anaphylaxis. CAUSES  "True" drug allergies occur when there is an allergic reaction to a medicine. This is caused by overactivity of the immune system. First, the body becomes sensitized. The immune system is triggered by your first exposure to the medicine. Following this first exposure, future exposure to  the same medicine may be life-threatening. Almost any medicine can cause an allergic reaction. Common ones are:  Penicillin.  Sulfonamides (sulfa drugs).  Local anesthetics.  X-ray dyes that contain iodine. SYMPTOMS  Common symptoms of a minor allergic reaction are:  Swelling around the mouth.  An itchy red rash or hives.  Vomiting or diarrhea. Anaphylaxis can cause swelling of the mouth and throat. This makes it difficult to breathe and swallow. Severe reactions can be fatal within seconds, even after exposure to only a trace amount of the drug that causes the reaction. HOME CARE INSTRUCTIONS   If you are unsure of what caused your reaction, keep a diary of foods and medicines used. Include the symptoms that followed. Avoid anything that causes reactions.  You may want to follow up with an allergy specialist after the reaction has cleared in order to be tested to confirm the allergy. It is important to confirm that your reaction is an allergy, not just a side effect to the medicine. If you have a true allergy to a medicine, this may prevent that medicine and related medicines from being given to you when you are very ill.  If you have hives or a rash:  Take medicines as directed by your caregiver.  You may use an over-the-counter antihistamine (diphenhydramine) as needed.  Apply cold compresses to the skin or take baths in cool water. Avoid hot baths or showers. °· If you are severely allergic: °¨ Continuous observation after a severe reaction may be needed. Hospitalization is often required. °¨ Wear a medical alert bracelet or necklace stating your allergy. °¨ You and your family must learn how to use an anaphylaxis kit or give an epinephrine injection to temporarily treat an emergency allergic reaction. If you have had a severe reaction, always carry your epinephrine injection or anaphylaxis kit with you. This can be lifesaving if you have a severe reaction. °· Do not drive or  perform tasks after treatment until the medicines used to treat your reaction have worn off, or until your caregiver says it is okay. °SEEK MEDICAL CARE IF:  °· You think you had an allergic reaction. Symptoms usually start within 30 minutes after exposure. °· Symptoms are getting worse rather than better. °· You develop new symptoms. °· The symptoms that brought you to your caregiver return. °SEEK IMMEDIATE MEDICAL CARE IF:  °· You have swelling of the mouth, difficulty breathing, or wheezing. °· You have a tight feeling in your chest or throat. °· You develop hives, swelling, or itching all over your body. °· You develop severe vomiting or diarrhea. °· You feel faint or pass out. °This is an emergency. Use your epinephrine injection or anaphylaxis kit as you have been instructed. Call for emergency medical help. Even if you improve after the injection, you need to be examined at a hospital emergency department. °MAKE SURE YOU:  °· Understand these instructions. °· Will watch your condition. °· Will get help right away if you are not doing well or get worse. °Document Released: 11/03/2005 Document Revised: 01/26/2012 Document Reviewed: 04/09/2011 °[employer]® Patient Information ©2015 [employer]. This information is not intended to replace advice given to you by your health care provider. Make sure you discuss any questions you have with your health care provider. ° °Drug Rash °Skin reactions can be caused by several different drugs. Allergy to the medicine can cause itching, hives, and other rashes. Sun exposure causes a red rash with some medicines. Mononucleosis virus can cause a similar red rash when you are taking antibiotics. Sometimes, the rash may be accompanied by pain. The drug rash may happen with new drugs or with medicines that you have been taking for a while. The rash cannot be spread from person to person. In most cases, the symptoms of a drug rash are gone within a few days of stopping the  medicine. °Your rash, including hives (urticaria), is most likely from the following medicines: °· Antibiotics or antimicrobials. °· Anticonvulsants or seizure medicines. °· Antihypertensives or blood pressure medicines. °· Antimalarials. °· Antidepressants or depression medicines. °· Antianxiety drugs. °· Diuretics or water pills. °· Nonsteroidal anti-inflammatory drugs. °· Simvastatin. °· Lithium. °· Omeprazole. °· Allopurinol. °· Pseudoephedrine. °· Amiodarone. °· Packed red blood cells, when you get a blood transfusion. °· Contrast media, such as when getting an imaging test (CT or CAT scan). °This drug list is not all inclusive, but drug rashes have been reported with all the medicines listed above. Your caregiver will tell you which medicines to avoid. °If you react to a medicine, a similar or worse reaction can occur the next time you take it. If you need to stop taking an antibiotic because of a drug rash, an alternative antibiotic may be needed to get rid of your infection. Antihistamine or cortisone drugs may be prescribed to   help relieve your symptoms. Stay out of the sun until the rash is completely gone.  °Be sure to let your caregiver know about your drug reaction. Do not take this medicine in the future. Call your caregiver if your drug rash does not improve within 3 to 4 days. °SEEK IMMEDIATE MEDICAL CARE IF:  °· You develop breathing problems, swelling in the throat, or wheezing. °· You have weakness, fainting, fever, and muscle or joint pains. °· You develop blisters or peeling of skin, especially around the mouth. °Document Released: 12/11/2004 Document Revised: 03/20/2014 Document Reviewed: 09/21/2008 °[employer]® Patient Information ©2015 [employer]. This information is not intended to replace advice given to you by your health care provider. Make sure you discuss any questions you have with your health care provider. ° °

## 2015-02-05 ENCOUNTER — Telehealth: Payer: Self-pay | Admitting: Diagnostic Neuroimaging

## 2015-02-05 NOTE — Telephone Encounter (Signed)
Talked with patient and she said she went to urgent care on Friday because she developed a rash all over her arms and legs. They told her she was having a medication reaction. She went to Dr. Priscille Heidelberg today and they told her they believe she is having a reaction to the propanolol that Dr. Jaynee Eagles prescribed on 01/17/15. She said she noticed today she has now developed a rash on her back. She was given a medrol dosepak at urgent care and atarax. I told her if her symptoms get worse to go back to urgent care. I also told her I would let Dr. Jaynee Eagles know and call her back in the morning. Pt verbalized understanding.

## 2015-02-05 NOTE — Telephone Encounter (Signed)
Via answering service: "allergic rx to new rx, needs new rx". Please call patient.

## 2015-02-06 ENCOUNTER — Other Ambulatory Visit: Payer: Self-pay | Admitting: Neurology

## 2015-02-06 MED ORDER — PRIMIDONE 50 MG PO TABS: 50.0000 mg | ORAL_TABLET | Freq: Every day | ORAL | Status: DC

## 2015-02-06 NOTE — Telephone Encounter (Signed)
Will try primidone, discussed with patient. Will start low to avoid side effects, discussed side effects with her.

## 2015-02-18 ENCOUNTER — Emergency Department (HOSPITAL_COMMUNITY): Payer: PPO

## 2015-02-18 ENCOUNTER — Encounter (HOSPITAL_COMMUNITY): Payer: Self-pay | Admitting: *Deleted

## 2015-02-18 ENCOUNTER — Emergency Department (HOSPITAL_COMMUNITY)
Admission: EM | Admit: 2015-02-18 | Discharge: 2015-02-18 | Disposition: A | Discharge status: Home / Self Care | Payer: PPO | Attending: Emergency Medicine | Admitting: Emergency Medicine

## 2015-02-18 DIAGNOSIS — H9190 Unspecified hearing loss, unspecified ear: Secondary | ICD-10-CM | POA: Diagnosis not present

## 2015-02-18 DIAGNOSIS — Z9889 Other specified postprocedural states: Secondary | ICD-10-CM | POA: Diagnosis not present

## 2015-02-18 DIAGNOSIS — S52501A Unspecified fracture of the lower end of right radius, initial encounter for closed fracture: Secondary | ICD-10-CM

## 2015-02-18 DIAGNOSIS — M1990 Unspecified osteoarthritis, unspecified site: Secondary | ICD-10-CM | POA: Diagnosis not present

## 2015-02-18 DIAGNOSIS — Y9289 Other specified places as the place of occurrence of the external cause: Secondary | ICD-10-CM | POA: Insufficient documentation

## 2015-02-18 DIAGNOSIS — Z7982 Long term (current) use of aspirin: Secondary | ICD-10-CM | POA: Insufficient documentation

## 2015-02-18 DIAGNOSIS — Y9389 Activity, other specified: Secondary | ICD-10-CM | POA: Insufficient documentation

## 2015-02-18 DIAGNOSIS — S0001XA Abrasion of scalp, initial encounter: Secondary | ICD-10-CM | POA: Diagnosis not present

## 2015-02-18 DIAGNOSIS — W01198A Fall on same level from slipping, tripping and stumbling with subsequent striking against other object, initial encounter: Secondary | ICD-10-CM | POA: Diagnosis not present

## 2015-02-18 DIAGNOSIS — S6991XA Unspecified injury of right wrist, hand and finger(s), initial encounter: Secondary | ICD-10-CM | POA: Diagnosis present

## 2015-02-18 DIAGNOSIS — E079 Disorder of thyroid, unspecified: Secondary | ICD-10-CM | POA: Insufficient documentation

## 2015-02-18 DIAGNOSIS — Y998 Other external cause status: Secondary | ICD-10-CM | POA: Diagnosis not present

## 2015-02-18 DIAGNOSIS — Z79899 Other long term (current) drug therapy: Secondary | ICD-10-CM | POA: Insufficient documentation

## 2015-02-18 DIAGNOSIS — S52591A Other fractures of lower end of right radius, initial encounter for closed fracture: Secondary | ICD-10-CM | POA: Diagnosis not present

## 2015-02-18 DIAGNOSIS — Z791 Long term (current) use of non-steroidal anti-inflammatories (NSAID): Secondary | ICD-10-CM | POA: Diagnosis not present

## 2015-02-18 DIAGNOSIS — W19XXXA Unspecified fall, initial encounter: Secondary | ICD-10-CM

## 2015-02-18 MED ORDER — ACETAMINOPHEN 325 MG PO TABS
650.0000 mg | ORAL_TABLET | Freq: Once | ORAL | Status: AC
  Administered 2015-02-18: 650 mg via ORAL
  Filled 2015-02-18: qty 2

## 2015-02-18 MED ORDER — HYDROCODONE-ACETAMINOPHEN 5-325 MG PO TABS: 1.0000 | ORAL_TABLET | ORAL | Status: DC | PRN

## 2015-02-18 NOTE — ED Notes (Signed)
Dr. Canary Brim aware of patients BP. No new orders. Pt denies symptoms.

## 2015-02-18 NOTE — ED Notes (Signed)
Pt returned from radiology.

## 2015-02-18 NOTE — ED Notes (Signed)
Pt transported to MRI 

## 2015-02-18 NOTE — Progress Notes (Signed)
Orthopedic Tech Progress Note Patient Details:  Dana Klein [birth year] [id number]  Ortho Devices Type of Ortho Device: Ace wrap, Arm sling, Sugartong splint Ortho Device/Splint Location: RUE Ortho Device/Splint Interventions: Ordered, Application   Braulio Bosch 02/18/2015, 3:27 PM

## 2015-02-18 NOTE — ED Notes (Signed)
Pt head wound cleaned off with soap and water

## 2015-02-18 NOTE — Discharge Instructions (Signed)
Wrist Fracture A wrist fracture is a break or crack in one of the bones of your wrist. Your wrist is made up of eight small bones at the palm of your hand (carpal bones) and two long bones that make up your forearm (radius and ulna).  CAUSES   A direct blow to the wrist.  Falling on an outstretched hand.  Trauma, such as a car accident or a fall. RISK FACTORS Risk factors for wrist fracture include:   Participating in contact and high-risk sports, such as skiing, biking, and ice skating.  Taking steroid medicines.  Smoking.  Being female.  Being Caucasian.  Drinking more than three alcoholic beverages per day.  Having low or lowered bone density (osteoporosis or osteopenia).  Age. Older adults have decreased bone density.  Women who have had menopause.  History of previous fractures. SIGNS AND SYMPTOMS Symptoms of wrist fractures include tenderness, bruising, and inflammation. Additionally, the wrist may hang in an odd position or appear deformed.  DIAGNOSIS Diagnosis may include:  Physical exam.  X-ray. TREATMENT Treatment depends on many factors, including the nature and location of the fracture, your age, and your activity level. Treatment for wrist fracture can be nonsurgical or surgical.  Nonsurgical Treatment A plaster cast or splint may be applied to your wrist if the bone is in a good position. If the fracture is not in good position, it may be necessary for your health care provider to realign it before applying a splint or cast. Usually, a cast or splint will be worn for several weeks.  Surgical Treatment Sometimes the position of the bone is so far out of place that surgery is required to apply a device to hold it together as it heals. Depending on the fracture, there are a number of options for holding the bone in place while it heals, such as a cast and metal pins.  HOME CARE INSTRUCTIONS  Keep your injured wrist elevated and move your fingers as much as  possible.  Do not put pressure on any part of your cast or splint. It may break.   Use a plastic bag to protect your cast or splint from water while bathing or showering. Do not lower your cast or splint into water.  Take medicines only as directed by your health care provider.  Keep your cast or splint clean and dry. If it becomes wet, damaged, or suddenly feels too tight, contact your health care provider right away.  Do not use any tobacco products including cigarettes, chewing tobacco, or electronic cigarettes. Tobacco can delay bone healing. If you need help quitting, ask your health care provider.  Keep all follow-up visits as directed by your health care provider. This is important.  Ask your health care provider if you should take supplements of calcium and vitamins C and D to promote bone healing. SEEK MEDICAL CARE IF:   Your cast or splint is damaged, breaks, or gets wet.  You have a fever.  You have chills.  You have continued severe pain or more swelling than you did before the cast was put on. SEEK IMMEDIATE MEDICAL CARE IF:   Your hand or fingernails on the injured arm turn blue or gray, or feel cold or numb.  You have decreased feeling in the fingers of your injured arm. MAKE SURE YOU:  Understand these instructions.  Will watch your condition.  Will get help right away if you are not doing well or get worse. Document Released: 08/13/2005 Document Revised:   03/20/2014 Document Reviewed: 11/21/2011 [employer] Patient Information 2015 [employer]. This information is not intended to replace advice given to you by your health care provider. Make sure you discuss any questions you have with your health care provider.  

## 2015-02-18 NOTE — ED Provider Notes (Signed)
CSN: [id number]     Arrival date & time 02/18/15  1330 History   First MD Initiated Contact with Patient 02/18/15 1401     Chief Complaint  Patient presents with  . Fall     (Consider location/radiation/quality/duration/timing/severity/associated sxs/prior Treatment) Patient is a 81 y.o. female presenting with fall. The history is provided by the patient.  Fall This is a new problem. Pertinent negatives include no chest pain, no abdominal pain, no headaches and no shortness of breath.   patient with fall. Slipped on a stair and fell backwards. She is complaining of pain in her right wrist with swelling. No numbness or weakness. She also struck her head but denies loss of consciousness. She denies headache. Previous history of surgery on her other wrist by Dr. Caralyn Guile. She is not on anticoagulation. No neck pain. No chest or abdominal pain. She has been ambulatory since the fall.  Past Medical History  Diagnosis Date  . Back pain   . Osteoarthritis   . Hearing loss   . Bladder incontinence   . Leg numbness   . Numbness of foot   . Thyroid disease    Past Surgical History  Procedure Laterality Date  . Left wrist surgery    . Carpal tunnel release    . Back surgery    . Abdominal hysterectomy     Family History  Problem Relation Age of Onset  . Lung cancer Mother   . Colon cancer Mother   . Pneumonia Father   . Heart defect Father   . Diabetes Sister   . Diabetes Brother   . Diabetes Sister   . Diabetes Brother    History  Substance Use Topics  . Smoking status: Never Smoker   . Smokeless tobacco: Never Used  . Alcohol Use: No   OB History    No data available     Review of Systems  Constitutional: Negative for activity change and appetite change.  Eyes: Negative for pain.  Respiratory: Negative for chest tightness and shortness of breath.   Cardiovascular: Negative for chest pain and leg swelling.  Gastrointestinal: Negative for nausea, vomiting, abdominal pain  and diarrhea.  Genitourinary: Negative for flank pain.  Musculoskeletal: Negative for back pain and neck stiffness.       Right wrist pain  Skin: Positive for wound. Negative for rash.  Neurological: Negative for weakness, numbness and headaches.  Psychiatric/Behavioral: Negative for behavioral problems.      Allergies  Sulfa antibiotics  Home Medications   Prior to Admission medications   Medication Sig Start Date End Date Taking? Authorizing Provider  acetaminophen (TYLENOL) 500 MG tablet Take 500 mg by mouth 2 (two) times daily.    Historical Provider, MD  aspirin 81 MG tablet Take 81 mg by mouth every other day. 2 or 3 times weekly    Historical Provider, MD  Calcium Carbonate-Vitamin D (OSCAL 500/200 D-3 PO) Take by mouth 2 (two) times daily.      Historical Provider, MD  DULoxetine (CYMBALTA) 30 MG capsule Take 30 mg by mouth daily. 12/20/14   Historical Provider, MD  gabapentin (NEURONTIN) 300 MG capsule Take 300 capsules by mouth daily. 08/18/13   Historical Provider, MD  hydrOXYzine (ATARAX/VISTARIL) 10 MG tablet Take 1 tablet (10 mg total) by mouth every 4 (four) hours as needed for itching. 02/02/15   Liam Graham, PA-C  levothyroxine (SYNTHROID, LEVOTHROID) 50 MCG tablet Take 50 mcg by mouth daily before breakfast.    Historical Provider,  MD  magnesium oxide (MAG-OX) 400 MG tablet Take 400 mg by mouth daily.    Historical Provider, MD  meloxicam (MOBIC) 15 MG tablet Take 15 mg by mouth daily.    Historical Provider, MD  methylPREDNISolone (MEDROL DOSEPAK) 4 MG tablet Use as directed on package instructions 02/02/15   Liam Graham, PA-C  Misc Natural Products (OSTEO BI-FLEX JOINT SHIELD PO) Take by mouth.    Historical Provider, MD  Multiple Vitamins-Minerals (CENTRUM SILVER PO) Take by mouth daily.      Historical Provider, MD  Omega-3 Fatty Acids (FISH OIL) 1200 MG CAPS Take 1,200 mg by mouth 2 (two) times daily.      Historical Provider, MD  primidone (MYSOLINE) 50 MG  tablet Take 1 tablet (50 mg total) by mouth at bedtime. 02/06/15   Melvenia Beam, MD  rOPINIRole (REQUIP) 1 MG tablet Take 1 mg by mouth daily. 08/18/13   Historical Provider, MD  solifenacin (VESICARE) 10 MG tablet Take by mouth daily.    Historical Provider, MD   BP 166/67 mmHg  Pulse 59  Temp(Src) 97.4 F (36.3 C) (Oral)  Resp 18  Ht 5' (1.524 m)  Wt 101 lb 5 oz (45.955 kg)  BMI 19.79 kg/m2  SpO2 100% Physical Exam  Constitutional: She is oriented to person, place, and time. She appears well-developed and well-nourished.  HENT:  Head: Normocephalic and atraumatic.  Abrasion to left frontal scalp. No clear laceration. No bony tenderness.  Eyes: EOM are normal. Pupils are equal, round, and reactive to light.  Neck: Normal range of motion. Neck supple.  Cardiovascular: Normal rate, regular rhythm and normal heart sounds.   No murmur heard. Pulmonary/Chest: Effort normal and breath sounds normal. No respiratory distress. She has no wheezes. She has no rales.  Abdominal: Soft. Bowel sounds are normal. She exhibits no distension. There is no tenderness. There is no rebound and no guarding.  Musculoskeletal: Normal range of motion. She exhibits tenderness.  Deformity and tenderness to right wrist with some dorsal angulation. Also tenderness over distal ulna. Strong radial pulse. Neurovascular intact over hand over radial median and ulnar distribution. Skin intact  Neurological: She is alert and oriented to person, place, and time. No cranial nerve deficit.  Skin: Skin is warm and dry.  Psychiatric: She has a normal mood and affect. Her speech is normal.  Nursing note and vitals reviewed.   ED Course  Procedures (including critical care time) Labs Review Labs Reviewed - No data to display  Imaging Review Dg Wrist Complete Right  02/18/2015   CLINICAL DATA:  Acute wrist pain and swelling following a recent fall earlier today down stairs.  EXAM: RIGHT WRIST - COMPLETE 3+ VIEW   COMPARISON:  None.  FINDINGS: There is an acute impacted displaced fracture of the right distal radius involving the articular surface. Slight dorsal displacement and angulation.  Small ulnar styloid corticated ossicle evident, suspect remote avulsion of the ulnar styloid. Soft tissue swelling about the wrist. Carpal bones appear intact. Bones are osteopenic.  IMPRESSION: Acute dorsally displaced intra-articular fracture of the right distal radius.   Electronically Signed   By: Jerilynn Mages.  Shick M.D.   On: 02/18/2015 14:46   Ct Head Wo Contrast  02/18/2015   CLINICAL DATA:  Tripped and fell on stairs, initial encounter  EXAM: CT HEAD WITHOUT CONTRAST  TECHNIQUE: Contiguous axial images were obtained from the base of the skull through the vertex without intravenous contrast.  COMPARISON:  None.  FINDINGS: The bony  calvarium is intact. Soft tissue laceration is noted on the left consistent with the given clinical history. Diffuse atrophic changes are seen. No acute hemorrhage is identified. Some vague decreased attenuation is noted within the right cerebellar hemisphere which may represent some acute ischemia.  IMPRESSION: Atrophic changes.  Changes suspicious for ischemia within the right cerebellar hemisphere   Electronically Signed   By: Inez Catalina M.D.   On: 02/18/2015 15:07     EKG Interpretation None      MDM   Final diagnoses:  Fall, initial encounter  Distal radius fracture, right, closed, initial encounter    Patient with fall. Appears to be mechanical states she just lost her balance and fell backwards, however CT scan of the head showed possible cerebellar acute ischemia. Does have slight abnormality on right heel on her left shin. Good grip strength bilaterally. Will get MRI to evaluate for acute stroke. Does have a wrist fracture and patient was splinted and will follow-up with Dr. Caralyn Guile, who she has seen in the past.    Davonna Belling, MD 02/18/15 1530

## 2015-02-18 NOTE — ED Notes (Signed)
Pt reports tripping and falling when going up stairs to front porch, hit left side of head on concrete, denies loc. Has small abrasion and hematoma noted. Small abrasion to left forearm and has pain to right wrist.

## 2015-02-20 ENCOUNTER — Telehealth: Payer: Self-pay | Admitting: Neurology

## 2015-02-20 NOTE — Telephone Encounter (Signed)
Pt is calling to cxl her appt for 4/6 due to she fell and broke her arm on Sunday and she is having surgery 4/6.  She states that when she was in the ER they did a CT scan because she hit her head when she fell. The scan did show something in the back of the brain that looked like blood.  They also did a MRI and that did not show anything.  The pt states she is still concerned about the CT scan.  She would like for Dr. Jaynee Eagles to view the CT and MRI and give her a call to let her know what she thinks.  She states you can call her this afternoon and if not today she will be back home on Thursday from her surgery.  Please call and advise.

## 2015-02-21 ENCOUNTER — Ambulatory Visit: Payer: PPO | Admitting: Neurology

## 2015-02-21 NOTE — Telephone Encounter (Signed)
Called, left message on voicemail. Will try again later.

## 2015-02-22 NOTE — Telephone Encounter (Signed)
Called again and left message on cell phone. Asked them to call back if they still would like to discuss. thanks

## 2015-03-07 ENCOUNTER — Encounter: Payer: Self-pay | Admitting: Neurology

## 2015-03-07 ENCOUNTER — Ambulatory Visit (INDEPENDENT_AMBULATORY_CARE_PROVIDER_SITE_OTHER): Payer: PPO | Admitting: Neurology

## 2015-03-07 VITALS — BP 178/88 | HR 71 | Temp 97.8°F | Ht 60.0 in | Wt 103.5 lb

## 2015-03-07 DIAGNOSIS — R251 Tremor, unspecified: Secondary | ICD-10-CM

## 2015-03-07 DIAGNOSIS — G609 Hereditary and idiopathic neuropathy, unspecified: Secondary | ICD-10-CM | POA: Diagnosis not present

## 2015-03-07 NOTE — Progress Notes (Signed)
[name]    Provider:  Dr Jaynee Eagles Referring Provider: Tommy Medal, MD Primary Care Physician:  Tommy Medal, MD  CC:  tremor  HPI:  Dana Klein is a 81 y.o. female here as a follow up.  She fell down the stairs. Broke her arm. She was hospitalized. Reviewed images of her brain that were done while she was in the hospital. She is taking 25mg  of the primidone. She will start taking the whole tablet.   MRI of the brain: personally reviewed images of the brain and agree with the following:  IMPRESSION: No acute brain infarction. The cerebellum does not show any focal insult.  Mild age related atrophy and chronic small vessel change, less than often seen in healthy individuals of this age.  Some inflammatory change of the maxillary sinuses. Small amount of fluid in the mastoid air cells left more than right.  HPI: Dana Klein is a 81 y.o. female here as a follow up.  She worked out in the yard and is having pain in the anterior legs likely from overuse. She is a little slow, she hasn't been walking outside as much. Now that the weather is warm she can go back. Daughter denies hypophonia, changes in handwriting. She has hearing difficulty and usually talks too loud. Daughter feels expression is normal but family members have noticed the mild mouth tremor. Sinemet does not help her symptoms.   Emg/ncs 10/20: There is electrophysiologic evidence of a symmetric length-dependent severe axonal sensorimotor polyneuropathy. In addition, there is evidence of a moderately-severe right median nerve entrapment at the wrist. Decreased insertional activity of distal leg muscles could be due to atrophy. Clinical correlation suggested   Initial visit 08/29/2014: [name] patient who was previously seeing Dr. Janann Colonel and is now following up with me. She was referred for tremor and was diagnosed with PD and is on Sinemet 3 times a day. Took her pill at 8:15 am so it is  wearing off now. Tremor significantly improves with the medication. Her handwriting improves with the medication as well. Doesn't pay attention if she has any significant wearing off unless she is sitting reading but usually she is working outside and doesn't notice a tremor in fact was outside last night and just forgot to take her medication. No dyskinesias. Sometimes will fall over if she doesn't catch herself and "goes over". Using the cane when she goes out. At home doesn't use the cane. In the house she is fine, she can hold on and doesn't need it. Lives alone. Has a life alert necklace. Always wears it. Swallowing is fine, no choking. Sleeping well, uses requip for RLS. No rem sleep disorder. Not tired during the day.Notes some difficulty with short term memory, forgetting peoples names. Feels it is mild and not bothersome at this time  Had back surgery in 2010 and experiencing some low back pain and tightness in her leg muscles below the knees. She had a fracture. Back hurts when she bends over. If she wears tennis shoes, feels like a vice around the feet. No bruning, no numbness, no tingling. Elevating the legs help.   Reviewed notes, labs and imaging from outside physicians, which showed: Initial visit 08/2013, tremor started a year previous both hands (unsure if started in one or other or both) and mostly at rest. Feels walking is slow, unsteady, trouble turning. Good sense of smell.   Has insomnia, is stable. Requip and Neurontin for RLS helps her sleep. Recent labs all unremarkable:  cbc, crp, cmp, b12   Review of Systems: Patient complains of symptoms per HPI as well as the following symptoms: RLS, constipation,  urgency, memory loss, joint pain, aching muscles, tremors . Pertinent negatives per HPI. All others negative.   History   Social History  . Marital Status: Widowed    Spouse Name: N/A  . Number of Children: 1  . Years of Education: 12   Occupational History  .       Retired   Social History Main Topics  . Smoking status: Never Smoker   . Smokeless tobacco: Never Used  . Alcohol Use: No  . Drug Use: No  . Sexual Activity: Not on file   Other Topics Concern  . Not on file   Social History Narrative   Patient is a widow . Patient is retired and has a Dance movement psychotherapist.   Right handed.   Caffeine- One cup daily coffee.    Family History  Problem Relation Age of Onset  . Lung cancer Mother   . Colon cancer Mother   . Pneumonia Father   . Heart defect Father   . Diabetes Sister   . Diabetes Brother   . Diabetes Sister   . Diabetes Brother     Past Medical History  Diagnosis Date  . Back pain   . Osteoarthritis   . Hearing loss   . Bladder incontinence   . Leg numbness   . Numbness of foot   . Thyroid disease     Past Surgical History  Procedure Laterality Date  . Left wrist surgery    . Carpal tunnel release    . Back surgery    . Abdominal hysterectomy      Current Outpatient Prescriptions  Medication Sig Dispense Refill  . acetaminophen (TYLENOL) 325 MG tablet Take 325 mg by mouth 2 (two) times daily.    Marland Kitchen aspirin 81 MG tablet Take 81 mg by mouth every other day. 2 or 3 times weekly    . Calcium Carbonate-Vitamin D (OSCAL 500/200 D-3 PO) Take 1 tablet by mouth 2 (two) times daily.     . [employer] STOOL SOFTENER 100 MG capsule Take 100 mg by mouth 2 (two) times daily.  0  . [employer] VITAMIN C 500 MG tablet Take 500 mg by mouth daily.  0  . DULoxetine (CYMBALTA) 30 MG capsule Take 30 mg by mouth daily.  12  . gabapentin (NEURONTIN) 300 MG capsule Take 300 capsules by mouth 2 (two) times daily.     Marland Kitchen HYDROcodone-acetaminophen (NORCO/VICODIN) 5-325 MG per tablet Take 1 tablet by mouth every 4 (four) hours as needed. 15 tablet 0  . levothyroxine (SYNTHROID, LEVOTHROID) 50 MCG tablet Take 50 mcg by mouth daily before breakfast.    . magnesium oxide (MAG-OX) 400 MG tablet Take 400 mg by mouth daily.    . meloxicam (MOBIC) 15 MG tablet  Take 7.5 mg by mouth daily.     . methocarbamol (ROBAXIN) 500 MG tablet Take 500 mg by mouth every 6 (six) hours.  0  . Misc Natural Products (OSTEO BI-FLEX JOINT SHIELD PO) Take 1 tablet by mouth 2 (two) times daily.     . Multiple Vitamins-Minerals (CENTRUM SILVER PO) Take by mouth daily.      . Omega-3 Fatty Acids (FISH OIL) 1200 MG CAPS Take 1,200 mg by mouth 2 (two) times daily.      Marland Kitchen oxyCODONE-acetaminophen (PERCOCET/ROXICET) 5-325 MG per tablet Take 1 tablet by mouth every  4 (four) hours as needed for severe pain.    . primidone (MYSOLINE) 50 MG tablet Take 1 tablet (50 mg total) by mouth at bedtime. 30 tablet 3  . rOPINIRole (REQUIP) 1 MG tablet Take 0.5 mg by mouth at bedtime.     . solifenacin (VESICARE) 10 MG tablet Take 10 mg by mouth daily.     . hydrOXYzine (ATARAX/VISTARIL) 10 MG tablet Take 1 tablet (10 mg total) by mouth every 4 (four) hours as needed for itching. (Patient not taking: Reported on 02/18/2015) 20 tablet 0  . methylPREDNISolone (MEDROL DOSEPAK) 4 MG tablet Use as directed on package instructions (Patient not taking: Reported on 02/18/2015) 21 tablet 0   No current facility-administered medications for this visit.    Allergies as of 03/07/2015 - Review Complete 03/07/2015  Allergen Reaction Noted  . Propranolol Itching 02/18/2015  . Sulfa antibiotics Rash 06/10/2011    Vitals: Wt 103 lb 8 oz (46.947 kg) Last Weight:  Wt Readings from Last 1 Encounters:  03/07/15 103 lb 8 oz (46.947 kg)   Last Height:   Ht Readings from Last 1 Encounters:  02/18/15 5' (1.524 m)    Gait: Slow, stooped, slight shuffling, decreased arm swing L, (right in cast)  Motor Observation:  Right tremor (in cast, still with tremor) > left, resting with postural and intention components Tone:  Increased cogwheeling  Posture:  Posture is mildly stooped.    Strength:  Strength is V/V in the upper and lower limbs. (right arm in cast)    Assessment/Plan: [name]  81 year old woman presenting for follow up evaluation of history of bilateral hand tremor and muscle cramping. Based on clinical history and physical exam her findings are most consistent with a diagnosis of parkinsonism and she was started on Sinemet by Dr. Janann Colonel. However dopamine taken in the office in the office failed to produce improvement in any symptoms.   - May be misdiagnosed essential tremor, propranolol with rash. Increase primidone to 50mg   -continue Requip 1mg  qhs for RLS -check B12 level : normal - always use walking aid even in the house, fall precautions - Memory: monitor clinically - insomnia: good sleep hygiene - Neuropathy: neurontin tid Sarina Ill, MD  [facility] [address] [place], [place] [address]  Phone [phone] Fax [phone]  A total of 25 minutes was spent face-to-face with this patient. Over half this time was spent on counseling patient on the tremor diagnosis and different diagnostic and therapeutic options available.

## 2015-03-07 NOTE — Patient Instructions (Signed)
Overall you are doing fairly well but I do want to suggest a few things today:   Remember to drink plenty of fluid, eat healthy meals and do not skip any meals. Try to eat protein with a every meal and eat a healthy snack such as fruit or nuts in between meals. Try to keep a regular sleep-wake schedule and try to exercise daily, particularly in the form of walking, 20-30 minutes a day, if you can.   As far as your medications are concerned, I would like to suggest: primidone 50mg  at night  I would like to see you back in 6 weeks, sooner if we need to. Please call us with any interim questions, concerns, problems, updates or refill requests.   Please also call us for any test results so we can go over those with you on the phone.  My clinical assistant and will answer any of your questions and relay your messages to me and also relay most of my messages to you.   Our phone number is [phone]. We also have an after hours call service for urgent matters and there is a physician on-call for urgent questions. For any emergencies you know to call 911 or go to the nearest emergency room

## 2015-03-08 DIAGNOSIS — R251 Tremor, unspecified: Secondary | ICD-10-CM | POA: Insufficient documentation

## 2015-03-08 DIAGNOSIS — G609 Hereditary and idiopathic neuropathy, unspecified: Secondary | ICD-10-CM | POA: Insufficient documentation

## 2015-03-19 ENCOUNTER — Telehealth: Payer: Self-pay | Admitting: Neurology

## 2015-03-19 NOTE — Telephone Encounter (Signed)
Called pt and she agreed to make an appointment with Dr. Rexene Alberts on Apr 02, 2015 at 11:30 to get a second opinion for Dr. Jaynee Eagles. Pt aware to come in 15 min prior to appt time.

## 2015-03-19 NOTE — Telephone Encounter (Signed)
I would be happy to see patient. Emma, pls call patient on put on my schedule. Thanks, Dr. Jaynee Eagles.

## 2015-03-19 NOTE — Telephone Encounter (Signed)
Patient with mild tremor and mild parkinsonism. Dxed with idiopathic PD by Dr. Janann Colonel, however Sinemet did not help the tremor, she was taking 25-100mg  tid to qid and did not help. Had her take 25-100 x2 pills in the office over lunch and I did not see any improvement after evaluation over an hour, no response to the sinemet. Possibly needed a higher dose but she only brought 2 pills. She wanted to try medication for essential tremor instead, tried inderal and she had a rash, not tolerating low-dose primidone which is also not helping tremor  (50mg  qhs). Already on neurontin 300mg  bid. Asked her to try and take 2 pills of neurontin and see if tremor improves. Also suggested, when with daughter, drink 1/2 glass of wine and see if helps tremor to try and distinguish PD vs essential. Suspect tremor from PD but would like a second opinion.  I think it is time we had a second opinion from a movement disorder specialist. Will ask Dr. Rexene Alberts if she is willing to give [place] an expert second opinion on PD vs other type of tremor and management in this lovely patient. In the meantime, patient will stop Primidone. Side effects she is reporting can cause falls.   Also, if patient and Dr. Rexene Alberts agree, would you please call patient Dana Klein and put her on Dr. Guadelupe Sabin schedule for 30 minutes? You can postpone her next appointment with me until she is further evaluated by my colleague. Thank you.

## 2015-03-19 NOTE — Telephone Encounter (Signed)
Patient called and stated that Dr. Jaynee Eagles wanted her to call and give and update on how her medication Rx. primidone (MYSOLINE) 50 MG tablet was effecting her. She states that she is experience fatigue, dizziness/loss of balance, and her legs have been really stiff. She wants to know if this is a normal result of the medication. Please call and advise.

## 2015-03-26 ENCOUNTER — Other Ambulatory Visit (HOSPITAL_COMMUNITY): Payer: Self-pay | Admitting: Obstetrics and Gynecology

## 2015-03-28 LAB — CYTOLOGY - PAP

## 2015-04-02 ENCOUNTER — Encounter: Payer: Self-pay | Admitting: Neurology

## 2015-04-02 ENCOUNTER — Ambulatory Visit: Payer: PPO | Admitting: Neurology

## 2015-04-02 ENCOUNTER — Ambulatory Visit (INDEPENDENT_AMBULATORY_CARE_PROVIDER_SITE_OTHER): Payer: PPO | Admitting: Neurology

## 2015-04-02 VITALS — BP 142/76 | HR 68 | Resp 14 | Ht 60.0 in | Wt 100.0 lb

## 2015-04-02 DIAGNOSIS — G250 Essential tremor: Secondary | ICD-10-CM | POA: Diagnosis not present

## 2015-04-02 DIAGNOSIS — G609 Hereditary and idiopathic neuropathy, unspecified: Secondary | ICD-10-CM | POA: Diagnosis not present

## 2015-04-02 NOTE — Progress Notes (Signed)
Subjective:    Patient ID: Dana Klein is a 81 y.o. female.  HPI     Dana Age, MD, PhD [facility] [address] [place], [place] [address]  Dear Dana Klein,   I saw your patient, Dana Klein, upon your kind request in my clinic today for second opinion of her tremors. The patient is accompanied by her daughter today. As you know, Dana Klein is a very friendly 81 year old right-handed woman with an underlying medical history of osteoarthritis, hearing loss, bladder incontinence, neuropathy, thyroid disease and chronic back pain, as well as recent fall on 02/18/2015 with fracture of the right distal radius (surgery on 02/21/15 under Dr. Apolonio Klein at [place]), who has had a bilateral upper extremity tremor for the past few years, perhaps 2-3 years. Previously she was seen by Dr. Jim Klein who felt she had parkinsonism and tried her on Sinemet which did not improve her symptoms very much. You recently increased her Mysoline to 50 mg on 03/07/2015, but she became too sedated and she stopped it altogether. I reviewed your office note from 03/07/15. She has had a jaw tremor for many years.   Mysoline at 1/2 pill was somewhat helpful and she felt that she was able to tolerate it. She does not have a family history of parkinsonism or Parkinson's disease, or essential tremor that she can recall. She is the youngest of 37 children altogether. She has one daughter. She lives alone but her daughter has moved in with her since her fall on 02/18/2015. She is in the process of looking for a place closer to where her daughter and other family members live. She had a call button in the past but accidentally had Dana Klein several times because she is so active and when she was working in the yard and so forth she would accidentally pressed the call alert button.  She had a head CT without contrast on 02/18/2015: Atrophic changes. Changes suspicious for  ischemia within the right cerebellar hemisphere. In addition, personally reviewed the images through the PACS system and agree with the findings.  She had a brain MRI without contrast on 03/17/2015: No acute brain infarction. The cerebellum does not show any focal insult. Mild Klein related atrophy and chronic small vessel change, less than often seen in healthy individuals of this Klein. Some inflammatory change of the maxillary sinuses. Small amount of fluid in the mastoid air cells left more than right. In addition, personally reviewed the images through the PACS system and agree with the findings.  Her Past Medical History Is Significant For: Past Medical History  Diagnosis Date  . Back pain   . Osteoarthritis   . Hearing loss   . Bladder incontinence   . Leg numbness   . Numbness of foot   . Thyroid disease     Her Past Surgical History Is Significant For: Past Surgical History  Procedure Laterality Date  . Left wrist surgery    . Carpal tunnel release    . Back surgery    . Abdominal hysterectomy      Her Family History Is Significant For: Family History  Problem Relation Klein of Onset  . Lung cancer Mother   . Colon cancer Mother   . Pneumonia Father   . Heart defect Father   . Diabetes Sister   . Diabetes Brother   . Diabetes Sister   . Diabetes Brother     Her Social History Is Significant For:  History   Social History  . Marital Status: Widowed    Spouse Name: N/A  . Number of Children: 1  . Years of Education: 12   Occupational History  .      Retired   Social History Main Topics  . Smoking status: Never Smoker   . Smokeless tobacco: Never Used  . Alcohol Use: No  . Drug Use: No  . Sexual Activity: Not on file   Other Topics Concern  . None   Social History Narrative   Patient is a widow . Patient is retired and has a Dance movement psychotherapist.   Right handed.   Caffeine- One cup daily coffee.    Her Allergies Are:  Allergies  Allergen Reactions   . Propranolol Itching    Rash All over   . Sulfa Antibiotics Rash  :   Her Current Medications Are:  Outpatient Encounter Prescriptions as of 04/02/2015  Medication Sig  . acetaminophen (TYLENOL) 325 MG tablet Take 325 mg by mouth 2 (two) times daily.  Marland Kitchen aspirin 81 MG tablet Take 81 mg by mouth every other day. 2 or 3 times weekly  . Calcium Carbonate-Vitamin D (OSCAL 500/200 D-3 PO) Take 1 tablet by mouth 2 (two) times daily.   . [facility] STOOL SOFTENER 100 MG capsule Take 100 mg by mouth 2 (two) times daily.  . [facility] VITAMIN C 500 MG tablet Take 500 mg by mouth daily.  . DULoxetine (CYMBALTA) 30 MG capsule Take 30 mg by mouth daily.  Marland Kitchen gabapentin (NEURONTIN) 300 MG capsule Take 300 capsules by mouth 2 (two) times daily.   . hydrOXYzine (ATARAX/VISTARIL) 10 MG tablet Take 1 tablet (10 mg total) by mouth every 4 (four) hours as needed for itching.  . levothyroxine (SYNTHROID, LEVOTHROID) 50 MCG tablet Take 50 mcg by mouth daily before breakfast.  . magnesium oxide (MAG-OX) 400 MG tablet Take 400 mg by mouth daily.  . meloxicam (MOBIC) 15 MG tablet Take 7.5 mg by mouth daily.   . Misc Natural Products (OSTEO BI-FLEX JOINT SHIELD PO) Take 1 tablet by mouth 2 (two) times daily.   . Multiple Vitamins-Minerals (CENTRUM SILVER PO) Take by mouth daily.    . Omega-3 Fatty Acids (FISH OIL) 1200 MG CAPS Take 1,200 mg by mouth 2 (two) times daily.    Marland Kitchen rOPINIRole (REQUIP) 1 MG tablet Take 0.5 mg by mouth at bedtime.   . solifenacin (VESICARE) 10 MG tablet Take 10 mg by mouth daily.   . [DISCONTINUED] HYDROcodone-acetaminophen (NORCO/VICODIN) 5-325 MG per tablet Take 1 tablet by mouth every 4 (four) hours as needed.  . [DISCONTINUED] methocarbamol (ROBAXIN) 500 MG tablet Take 500 mg by mouth every 6 (six) hours.  . [DISCONTINUED] methylPREDNISolone (MEDROL DOSEPAK) 4 MG tablet Use as directed on package instructions (Patient not taking: Reported on 02/18/2015)  . [DISCONTINUED] oxyCODONE-acetaminophen  (PERCOCET/ROXICET) 5-325 MG per tablet Take 1 tablet by mouth every 4 (four) hours as needed for severe pain.  . [DISCONTINUED] primidone (MYSOLINE) 50 MG tablet Take 1 tablet (50 mg total) by mouth at bedtime. (Patient taking differently: Take 25 mg by mouth at bedtime. )   No facility-administered encounter medications on file as of 04/02/2015.  :   Review of Systems:  Out of a complete 14 point review of systems, all are reviewed and negative with the exception of these symptoms as listed below:    Review of Systems  Neurological: Positive for tremors.       Tremors in both  hands and in mouth, patient feels unbalanced     Objective:  Neurologic Exam  Physical Exam Physical Examination:   Filed Vitals:   04/02/15 1123  BP: 142/76  Pulse: 68  Resp: 14   General Examination: The patient is a very pleasant 81 y.o. female in no acute distress. She appears well-developed and well-nourished and well groomed.   HEENT: Normocephalic, atraumatic, pupils are equal, round and reactive to light and accommodation. Funduscopic exam is normal with sharp disc margins noted. Extraocular tracking is good without limitation to gaze excursion or nystagmus noted. Normal smooth pursuit is noted. Hearing is grossly intact. Tympanic membranes are clear bilaterally. Face is symmetric with normal facial animation and normal facial sensation. Speech is clear with no dysarthria noted. There is no hypophonia. There is a mild persistent lower jaw and lower lip tremor. She has no significant voice tremor. Neck is supple with full range of passive and active motion. There are no carotid bruits on auscultation. Oropharynx exam reveals: mild mouth dryness, adequate dental hygiene and mild airway crowding, due to narrow airway entry and tonsils in place.. Mallampati is class I. Tongue protrudes centrally and palate elevates symmetrically.   Chest: Clear to auscultation without wheezing, rhonchi or crackles  noted.  Heart: S1+S2+0, regular and normal without murmurs, rubs or gallops noted.   Abdomen: Soft, non-tender and non-distended with normal bowel sounds appreciated on auscultation.  Extremities: There is no pitting edema in the distal lower extremities bilaterally. Pedal pulses are intact.  Skin: Warm and dry without trophic changes noted. There are no varicose veins.  Musculoskeletal: exam reveals no obvious joint deformities, tenderness or joint swelling or erythema. She has a Velcro brace on her right wrist. She has decrease in range of motion in her right wrist.   Neurologically:  Mental status: The patient is awake, alert and oriented in all 4 spheres. Her immediate and remote memory, attention, language skills and fund of knowledge are appropriate. There is no evidence of aphasia, agnosia, apraxia or anomia. Speech is clear with normal prosody and enunciation. Thought process is linear. Mood is normal and affect is normal.  Cranial nerves II - XII are as described above under HEENT exam. In addition: shoulder shrug is normal with equal shoulder height noted. Motor exam: Normal bulk, strength and tone is noted. There is no drift or rebound. There is no resting tremor. There is a bilateral upper extremity postural and action tremor, which is mild in degree. There tremor frequency is fairly fast and the amplitude is small. On Archimedes spiral drawing there is mild tremulousness noted. Handwriting is mildly tremulous, but legible. There is no evidence of micrographia.  Romberg is negative. Reflexes are 2+ throughout. Babinski: Toes are flexor bilaterally. Fine motor skills and coordination: intact with normal finger taps, normal hand movements, normal rapid alternating patting, normal foot taps and normal foot agility.  Cerebellar testing: No dysmetria or intention tremor on finger to nose testing. Heel to shin is unremarkable bilaterally. There is no truncal or gait ataxia.  Sensory exam:  intact to light touch, pinprick, vibration, temperature sense in the upper extremities with mild decrease to pinprick, temperature and vibration sense in the left more than right lower extremities below the knees.   Gait, station and balance: She stands with difficulty. No veering to one side is noted. No leaning to one side is noted. Posture is Klein-appropriate and stance is narrow based. Gait shows mild insecurity when walking. She is able to walk without  her cane. She brought her single pronged cane. She has a slight increase in lumbar kyphosis. She has preserved arm swing bilaterally. She turns slowly.  Assessment and Plan:    In summary, LINZY LAURY is a very pleasant 81 y.o.-year old female with an underlying medical history of osteoarthritis, hearing loss, bladder incontinence, neuropathy, thyroid disease and chronic back pain, as well as recent fall on 02/18/2015 with fracture of the right distal radius (surgery on 02/21/15 under Dr. Apolonio Klein at [place]), who has had a bilateral upper extremity tremor for the past few years, perhaps 2-3 years. In addition, she has had a lower jaw and lip tremor for several years. Her history and physical exam are in keeping with essential tremor. She had some side effects including sedation and balance issues when she was on Mysoline 50 mg once daily. She felt some symptomatic relief with half a pill once daily and felt she could tolerate this. Of note, she reports that she developed a rash when she was tried on propranolol. At this juncture, I explained to her that I did not see any evidence of parkinsonism and I reassured her and her daughter in that regard. I do believe she has essential tremor that may respond to low-dose primidone. She is encouraged to try half a pill of Mysoline once daily at night. Furthermore, she is strongly advised to use her cane for safety. She is in the process of finding a home closer to where her family is. She is also  advised to change positions slowly, and avoid turning abruptly. She's also encouraged to drink more water's to stay better hydrated. I explained my findings to her and her daughter and answered all their questions.   I will see her back on an as-needed basis. She is encouraged to follow-up with you in about 3 months time. She has enough medication to get started on Mysoline.   Most of my 25 minute visit today was spent in counseling and coordination of care, reviewing test results and reviewing medications and discussing the diagnosis, prognosis and treatment options of essential tremor.  Thank you very much for allowing me to participate in the care of this nice patient. If I can be of any further assistance to you please do not hesitate to talk to me.  Sincerely,   Dana Age, MD, PhD

## 2015-04-02 NOTE — Patient Instructions (Signed)
  Please remember, that any kind of tremor may be exacerbated by anxiety, anger, nervousness, excitement, dehydration, sleep deprivation, by caffeine, and low blood sugar values or blood sugar fluctuations. Some medications, especially some antidepressants and lithium can cause or exacerbate tremors. Tremors may temporarily calm down her subside with the use of a benzodiazepine such as Valium or related medications and with alcohol. Be aware however that drinking alcohol is not an approved treatment or appropriate treatment for tremor control and long-term use of benzodiazepines such as Valium, lorazepam, alprazolam, or clonazepam can cause habit formation, physical and psychological addiction.  You can go back to taking Mysoline (primidone) 50 mg strength: Take 1/2 pill each bedtime. Common side effects reported are: Sleepiness, drowsiness, balance problems, confusion, and GI related symptoms.  Change positions slowly and do not turn abruptly and drink more water.    I can see you back as needed. Follow up with Dr. Jaynee Eagles as scheduled.

## 2015-04-30 ENCOUNTER — Ambulatory Visit: Payer: PPO | Admitting: Podiatry

## 2015-05-02 ENCOUNTER — Ambulatory Visit: Payer: PPO | Admitting: Podiatry

## 2015-05-07 ENCOUNTER — Telehealth: Payer: Self-pay | Admitting: Neurology

## 2015-05-07 MED ORDER — GABAPENTIN 300 MG PO CAPS: 300.0000 mg | ORAL_CAPSULE | Freq: Three times a day (TID) | ORAL | Status: DC

## 2015-05-07 NOTE — Telephone Encounter (Signed)
Patient called requesting refill for gabapentin (NEURONTIN) 300 MG capsule. Patient states she is taking 3 day as directed by Dr Jaynee Eagles. [facility]acy is [facility] on [address]. Patient can be reached at [phone].

## 2015-05-07 NOTE — Telephone Encounter (Signed)
Last OV note says: - Neuropathy: neurontin tid.  Rx has been sent to last until appt.  Receipt confirmed by pharmacy.  I called back to advise.  She is aware.

## 2015-05-08 ENCOUNTER — Ambulatory Visit (INDEPENDENT_AMBULATORY_CARE_PROVIDER_SITE_OTHER): Payer: PPO | Admitting: Podiatry

## 2015-05-08 ENCOUNTER — Encounter: Payer: Self-pay | Admitting: Podiatry

## 2015-05-08 DIAGNOSIS — M79676 Pain in unspecified toe(s): Secondary | ICD-10-CM | POA: Diagnosis not present

## 2015-05-08 DIAGNOSIS — G629 Polyneuropathy, unspecified: Secondary | ICD-10-CM

## 2015-05-08 DIAGNOSIS — B351 Tinea unguium: Secondary | ICD-10-CM | POA: Diagnosis not present

## 2015-05-08 NOTE — Progress Notes (Signed)
Patient ID: BRUCHY MIKEL, female   DOB: December 04, 1927, 81 y.o.   MRN: [id number] Complaint:  Visit Type: Patient returns to my office for continued preventative foot care services. Complaint: Patient states" my nails have grown long and thick and become painful to walk and wear shoes" Patient has been diagnosed with DM neuropathy..She presents for preventative foot care services. No changes to ROS  Podiatric Exam: Vascular: dorsalis pedis and posterior tibial pulses are palpable bilateral. Capillary return is immediate. Temperature gradient is WNL. Skin turgor WNL  Sensorium: Normal Semmes Weinstein monofilament test. Normal tactile sensation bilaterally. Nail Exam: Pt has thick disfigured discolored nails with subungual debris noted bilateral entire nail hallux through fifth toenails Ulcer Exam: There is no evidence of ulcer or pre-ulcerative changes or infection. Orthopedic Exam: Muscle tone and strength are WNL. No limitations in general ROM. No crepitus or effusions noted. Foot type and digits show no abnormalities. Bony prominences are unremarkable. Skin: No Porokeratosis. No infection or ulcers  Diagnosis:  Tinea unguium, Pain in right toe, pain in left toes  Treatment & Plan Procedures and Treatment: Consent by patient was obtained for treatment procedures. The patient understood the discussion of treatment and procedures well. All questions were answered thoroughly reviewed. Debridement of mycotic and hypertrophic toenails, 1 through 5 bilateral and clearing of subungual debris. No ulceration, no infection noted.  Return Visit-Office Procedure: Patient instructed to return to the office for a follow up visit 3 months for continued evaluation and treatment.

## 2015-06-27 ENCOUNTER — Other Ambulatory Visit: Payer: Self-pay

## 2015-07-01 ENCOUNTER — Other Ambulatory Visit: Payer: Self-pay | Admitting: Neurology

## 2015-07-01 NOTE — Telephone Encounter (Signed)
Last OV note says: You can go back to taking Mysoline (primidone) 50 mg strength: Take 1/2 pill each bedtime

## 2015-07-19 ENCOUNTER — Ambulatory Visit (INDEPENDENT_AMBULATORY_CARE_PROVIDER_SITE_OTHER): Payer: PPO | Admitting: Neurology

## 2015-07-19 ENCOUNTER — Encounter (INDEPENDENT_AMBULATORY_CARE_PROVIDER_SITE_OTHER): Payer: Self-pay

## 2015-07-19 ENCOUNTER — Encounter: Payer: Self-pay | Admitting: Neurology

## 2015-07-19 VITALS — BP 154/69 | HR 77 | Ht 60.0 in | Wt 105.0 lb

## 2015-07-19 DIAGNOSIS — M1990 Unspecified osteoarthritis, unspecified site: Secondary | ICD-10-CM

## 2015-07-19 DIAGNOSIS — G250 Essential tremor: Secondary | ICD-10-CM

## 2015-07-19 MED ORDER — GABAPENTIN 300 MG PO CAPS: ORAL_CAPSULE | ORAL | Status: DC

## 2015-07-19 NOTE — Progress Notes (Signed)
[facility]    Provider: Dr Jaynee Eagles Referring Provider: Tommy Medal, MD Primary Care Physician: Tommy Medal, MD  CC: tremor  Interval history 07/19/2015: She was evaluated by Dr. Rexene Alberts in May who confirmed essential tremor. The tremor is still there. She is still taking the primidone at night. Her legs hurt, they are stiff and her feet hurt. She denies any sensory changes, no burning or tingling. They are very stiff. She has some redness and discoloration on her feet. She feels stiffness and swelling in the lower extremity. At the end of the day they are swollen. No cramping in the feet. Movement of the toes hurts, she keeps saying stiffness. Denies tingling, burning. It hurts all the time. They don't keep her awake. She is taking the neurontin twice daily. She is tolerating the neurontin really well. Will increase it to three times a day. One am, one afternoon and 2 pm. Discussed with patient that the symptoms in her feet are more likely due to osteoarthritis then neuropathy. I do recommend she follow up with her primary care. Can increase Neurontin to 3 times a day and see if that helps. We'll continue on current dose of primidone due to sedation risk, also Neurontin is good for essential tremor so increasing that may help as well.   Interval history 03/07/2015: Dana Klein is a 81 y.o. female here as a follow up. She fell down the stairs. Broke her arm. She was hospitalized. Reviewed images of her brain that were done while she was in the hospital. She is taking 25mg  of the primidone. She will start taking the whole tablet.   MRI of the brain: personally reviewed images of the brain and agree with the following:  IMPRESSION: No acute brain infarction. The cerebellum does not show any focal insult.  Mild age related atrophy and chronic small vessel change, less than often seen in healthy individuals of this age.  Some inflammatory change of the maxillary  sinuses. Small amount of fluid in the mastoid air cells left more than right.  HPI: Dana Klein is a 81 y.o. female here as a follow up.  She worked out in the yard and is having pain in the anterior legs likely from overuse. She is a little slow, she hasn't been walking outside as much. Now that the weather is warm she can go back. Daughter denies hypophonia, changes in handwriting. She has hearing difficulty and usually talks too loud. Daughter feels expression is normal but family members have noticed the mild mouth tremor. Sinemet does not help her symptoms.   Emg/ncs 10/20: There is electrophysiologic evidence of a symmetric length-dependent severe axonal sensorimotor polyneuropathy. In addition, there is evidence of a moderately-severe right median nerve entrapment at the wrist. Decreased insertional activity of distal leg muscles could be due to atrophy. Clinical correlation suggested   Initial visit 08/29/2014: [name] patient who was previously seeing Dr. Janann Colonel and is now following up with me. She was referred for tremor and was diagnosed with PD and is on Sinemet 3 times a day. Took her pill at 8:15 am so it is wearing off now. Tremor significantly improves with the medication. Her handwriting improves with the medication as well. Doesn't pay attention if she has any significant wearing off unless she is sitting reading but usually she is working outside and doesn't notice a tremor in fact was outside last night and just forgot to take her medication. No dyskinesias. Sometimes will fall over if she doesn't  catch herself and "goes over". Using the cane when she goes out. At home doesn't use the cane. In the house she is fine, she can hold on and doesn't need it. Lives alone. Has a life alert necklace. Always wears it. Swallowing is fine, no choking. Sleeping well, uses requip for RLS. No rem sleep disorder. Not tired during the day.Notes some difficulty with short term memory, forgetting  peoples names. Feels it is mild and not bothersome at this time  Had back surgery in 2010 and experiencing some low back pain and tightness in her leg muscles below the knees. She had a fracture. Back hurts when she bends over. If she wears tennis shoes, feels like a vice around the feet. No bruning, no numbness, no tingling. Elevating the legs help.   Reviewed notes, labs and imaging from outside physicians, which showed: Initial visit 08/2013, tremor started a year previous both hands (unsure if started in one or other or both) and mostly at rest. Feels walking is slow, unsteady, trouble turning. Good sense of smell.   Has insomnia, is stable. Requip and Neurontin for RLS helps her sleep. Recent labs all unremarkable: cbc, crp, cmp, b12  Review of Systems: Patient complains of symptoms per HPI as well as the following symptoms: Eye itching, hearing loss, constipation, incontinence of bladder, bruising, numbness, tremors, aching muscles. Pertinent negatives per HPI. All others negative.   Social History   Social History  . Marital Status: Widowed    Spouse Name: N/A  . Number of Children: 1  . Years of Education: 12   Occupational History  .      Retired   Social History Main Topics  . Smoking status: Never Smoker   . Smokeless tobacco: Never Used  . Alcohol Use: No  . Drug Use: No  . Sexual Activity: Not on file   Other Topics Concern  . Not on file   Social History Narrative   Patient is a widow . Patient is retired and has a Dance movement psychotherapist.   Right handed.   Caffeine- One cup daily coffee.    Family History  Problem Relation Age of Onset  . Lung cancer Mother   . Colon cancer Mother   . Pneumonia Father   . Heart defect Father   . Diabetes Sister   . Diabetes Brother   . Diabetes Sister   . Diabetes Brother     Past Medical History  Diagnosis Date  . Back pain   . Osteoarthritis   . Hearing loss   . Bladder incontinence   . Leg numbness   .  Numbness of foot   . Thyroid disease     Past Surgical History  Procedure Laterality Date  . Left wrist surgery    . Carpal tunnel release    . Back surgery    . Abdominal hysterectomy      Current Outpatient Prescriptions  Medication Sig Dispense Refill  . acetaminophen (TYLENOL) 325 MG tablet Take 325 mg by mouth 2 (two) times daily.    Marland Kitchen aspirin 81 MG tablet Take 81 mg by mouth every other day. 2 or 3 times weekly    . Calcium Carbonate-Vitamin D (OSCAL 500/200 D-3 PO) Take 1 tablet by mouth 2 (two) times daily.     . [employer] STOOL SOFTENER 100 MG capsule Take 100 mg by mouth 2 (two) times daily.  0  . DULoxetine (CYMBALTA) 30 MG capsule Take 30 mg by mouth daily.  12  .  gabapentin (NEURONTIN) 300 MG capsule Take 1 capsule (300 mg total) by mouth 3 (three) times daily. 90 capsule 2  . levothyroxine (SYNTHROID, LEVOTHROID) 50 MCG tablet Take 50 mcg by mouth daily before breakfast.    . magnesium oxide (MAG-OX) 400 MG tablet Take 400 mg by mouth daily.    . meloxicam (MOBIC) 15 MG tablet Take 7.5 mg by mouth daily.     . Misc Natural Products (OSTEO BI-FLEX JOINT SHIELD PO) Take 1 tablet by mouth 2 (two) times daily.     . Multiple Vitamins-Minerals (CENTRUM SILVER PO) Take by mouth daily.      . Omega-3 Fatty Acids (FISH OIL) 1200 MG CAPS Take 1,200 mg by mouth 2 (two) times daily.      . primidone (MYSOLINE) 50 MG tablet Take 0.5 tablets (25 mg total) by mouth at bedtime. 15 tablet 1  . rOPINIRole (REQUIP) 1 MG tablet Take 0.5 mg by mouth at bedtime.     . solifenacin (VESICARE) 10 MG tablet Take 10 mg by mouth daily.     . hydrOXYzine (ATARAX/VISTARIL) 10 MG tablet Take 1 tablet (10 mg total) by mouth every 4 (four) hours as needed for itching. (Patient not taking: Reported on 07/19/2015) 20 tablet 0   No current facility-administered medications for this visit.    Allergies as of 07/19/2015 - Review Complete 07/19/2015  Allergen Reaction Noted  . Propranolol Itching 02/18/2015  .  Sulfa antibiotics Rash 06/10/2011    Vitals: BP 154/69 mmHg  Pulse 77  Ht 5' (1.524 m)  Wt 105 lb (47.628 kg)  BMI 20.51 kg/m2 Last Weight:  Wt Readings from Last 1 Encounters:  07/19/15 105 lb (47.628 kg)   Last Height:   Ht Readings from Last 1 Encounters:  07/19/15 5' (1.524 m)   Physical exam: Exam: Gen: NAD                    CV: RRR, no MRG.  Eyes: Conjunctivae clear without exudates or hemorrhage  Neuro: Detailed Neurologic Exam  Speech:    Speech is normal; fluent and spontaneous with normal comprehension.  Cognition:    The patient is oriented to person, place, and time;    Cranial Nerves:    The pupils are equal, round, and reactive to light. Visual fields are full to finger confrontation. Extraocular movements are intact. Trigeminal sensation is intact and the muscles of mastication are normal. The face is symmetric. The palate elevates in the midline. Hearing intact. Voice is normal. Shoulder shrug is normal. The tongue has normal motion without fasciculations.   Coordination:    Normal finger to nose and heel to shin. Normal rapid alternating movements.   Gait:    Mildly stooped, turns slowly, narrow but not shuffling.  Motor Observation:    No resting tremor. Bilateral upper extremity postural and action tremor. High-frequency low amplitude. Tone:    Normal muscle tone.    Strength:    Strength is V/V in the upper and lower limbs.      Sensation: Decreased pinprick and temperature distally       Assessment/Plan:  28 81 year old woman presenting for follow up evaluation of history of bilateral hand tremor and muscle cramping. Based on clinical history and physical exam her findings are most consistent with a diagnosis of essential tremor. Today she is also complaining of stiffness in the feet.  - Stiffness in the feet likely arthritic as opposed to worsening of neuropathy. - Essential tremor: propranolol  with rash. We'll keep primidone at current  dose and increase Neurontin. -continue Requip 1mg  qhs for RLS -check B12 level : normal - always use walking aid even in the house, fall precautions - Memory: monitor clinically - insomnia: good sleep hygiene - Neuropathy: We'll increase Neurontin dose which may also be helpful for essential tremor.  She describes a lot of stiffness in the feet which seems more arthritic to me as opposed to worsening of neuropathy. She is seeing Dr. Alphonsus Sias on the 6th. There was no changes in her sensory exam today.    Sarina Ill, MD  [facility] [address] [place], [place] [address]  Phone [phone] Fax [phone]  A total of 30 minutes was spent face-to-face with this patient. Over half this time was spent on counseling patient on the trmor diagnosis and different diagnostic and therapeutic options available.

## 2015-07-19 NOTE — Patient Instructions (Signed)
Remember to drink plenty of fluid, eat healthy meals and do not skip any meals. Try to eat protein with a every meal and eat a healthy snack such as fruit or nuts in between meals. Try to keep a regular sleep-wake schedule and try to exercise daily, particularly in the form of walking, 20-30 minutes a day, if you can.   As far as your medications are concerned, I would like to suggest: Continue Primidone 1/2 tablet (25mg ) at night Increase Gabapentin to three times day as we discussed.   As far as diagnostic testing:   I would like to see you back in 6 months, sooner if we need to. Please call us with any interim questions, concerns, problems, updates or refill requests.   Please also call us for any test results so we can go over those with you on the phone.  My clinical assistant and will answer any of your questions and relay your messages to me and also relay most of my messages to you.   Our phone number is [phone]. We also have an after hours call service for urgent matters and there is a physician on-call for urgent questions. For any emergencies you know to call 911 or go to the nearest emergency room

## 2015-07-22 DIAGNOSIS — G250 Essential tremor: Secondary | ICD-10-CM | POA: Insufficient documentation

## 2015-07-22 DIAGNOSIS — M1990 Unspecified osteoarthritis, unspecified site: Secondary | ICD-10-CM | POA: Insufficient documentation

## 2015-07-30 ENCOUNTER — Other Ambulatory Visit: Payer: Self-pay | Admitting: Neurology

## 2015-08-14 ENCOUNTER — Encounter: Payer: Self-pay | Admitting: Podiatry

## 2015-08-14 ENCOUNTER — Ambulatory Visit (INDEPENDENT_AMBULATORY_CARE_PROVIDER_SITE_OTHER): Payer: PPO | Admitting: Podiatry

## 2015-08-14 DIAGNOSIS — B351 Tinea unguium: Secondary | ICD-10-CM

## 2015-08-14 DIAGNOSIS — M79676 Pain in unspecified toe(s): Secondary | ICD-10-CM

## 2015-08-14 NOTE — Progress Notes (Signed)
Patient ID: Dana Klein, female   DOB: Feb 28, 1928, 81 y.o.   MRN: [id number] Complaint:  Visit Type: Patient returns to my office for continued preventative foot care services. Complaint: Patient states" my nails have grown long and thick and become painful to walk and wear shoes..She presents for preventative foot care services. No changes to ROS. She says she has been diagnosed with essentail trermors.  Vascular: dorsalis pedis and posterior tibial pulses are palpable bilateral. Capillary return is immediate. Temperature gradient is WNL. Skin turgor WNL  Sensorium: Normal Semmes Weinstein monofilament test. Normal tactile sensation bilaterally. Nail Exam: Pt has thick disfigured discolored nails with subungual debris noted bilateral entire nail hallux through fifth toenails Ulcer Exam: There is no evidence of ulcer or pre-ulcerative changes or infection. Orthopedic Exam: Muscle tone and strength are WNL. No limitations in general ROM. No crepitus or effusions noted. Foot type and digits show no abnormalities. Bony prominences are unremarkable. Skin: No Porokeratosis. No infection or ulcers  Diagnosis:  Tinea unguium, Pain in right toe, pain in left toes  Treatment & Plan Procedures and Treatment: Consent by patient was obtained for treatment procedures. The patient understood the discussion of treatment and procedures well. All questions were answered thoroughly reviewed. Debridement of mycotic and hypertrophic toenails, 1 through 5 bilateral and clearing of subungual debris. No ulceration, no infection noted.  Return Visit-Office Procedure: Patient instructed to return to the office for a follow up visit 3 months for continued evaluation and treatment.

## 2015-09-13 NOTE — Telephone Encounter (Signed)
Error

## 2015-10-12 ENCOUNTER — Other Ambulatory Visit: Payer: Self-pay | Admitting: Neurology

## 2015-10-22 ENCOUNTER — Ambulatory Visit (HOSPITAL_COMMUNITY)
Admission: RE | Admit: 2015-10-22 | Discharge: 2015-10-22 | Disposition: A | Discharge status: Home / Self Care | Payer: PPO | Source: Ambulatory Visit | Attending: Internal Medicine | Admitting: Internal Medicine

## 2015-10-22 ENCOUNTER — Other Ambulatory Visit (HOSPITAL_COMMUNITY): Payer: Self-pay | Admitting: Internal Medicine

## 2015-10-22 DIAGNOSIS — R1031 Right lower quadrant pain: Secondary | ICD-10-CM | POA: Insufficient documentation

## 2015-10-22 DIAGNOSIS — R590 Localized enlarged lymph nodes: Secondary | ICD-10-CM | POA: Insufficient documentation

## 2015-10-22 DIAGNOSIS — K37 Unspecified appendicitis: Secondary | ICD-10-CM | POA: Diagnosis present

## 2015-10-22 LAB — BUN: BUN: 15 mg/dL (ref 6–20)

## 2015-10-22 LAB — CREATININE, SERUM
Creatinine, Ser: 0.47 mg/dL (ref 0.44–1.00)
GFR calc Af Amer: >60 mL/min (ref >60)
GFR calc non Af Amer: >60 mL/min (ref >60)

## 2015-10-22 MED ORDER — IOHEXOL 300 MG/ML  SOLN
50.0000 mL | INTRAMUSCULAR | Status: AC
  Administered 2015-10-22: 25 mL via ORAL

## 2015-10-22 MED ORDER — IOHEXOL 300 MG/ML  SOLN
100.0000 mL | Freq: Once | INTRAMUSCULAR | Status: AC | PRN
  Administered 2015-10-22: 80 mL via INTRAVENOUS

## 2015-10-23 ENCOUNTER — Other Ambulatory Visit (HOSPITAL_COMMUNITY): Payer: Self-pay | Admitting: Internal Medicine

## 2015-10-23 DIAGNOSIS — R599 Enlarged lymph nodes, unspecified: Secondary | ICD-10-CM

## 2015-10-24 ENCOUNTER — Ambulatory Visit (HOSPITAL_COMMUNITY)
Admission: RE | Admit: 2015-10-24 | Discharge: 2015-10-24 | Disposition: A | Discharge status: Home / Self Care | Payer: PPO | Source: Ambulatory Visit | Attending: Internal Medicine | Admitting: Internal Medicine

## 2015-10-24 DIAGNOSIS — R591 Generalized enlarged lymph nodes: Secondary | ICD-10-CM | POA: Insufficient documentation

## 2015-10-24 DIAGNOSIS — I2510 Atherosclerotic heart disease of native coronary artery without angina pectoris: Secondary | ICD-10-CM | POA: Insufficient documentation

## 2015-10-24 DIAGNOSIS — R599 Enlarged lymph nodes, unspecified: Secondary | ICD-10-CM

## 2015-10-24 MED ORDER — IOHEXOL 300 MG/ML  SOLN
75.0000 mL | Freq: Once | INTRAMUSCULAR | Status: AC | PRN
  Administered 2015-10-24: 75 mL via INTRAVENOUS

## 2015-10-26 ENCOUNTER — Other Ambulatory Visit (HOSPITAL_COMMUNITY): Payer: Self-pay | Admitting: Internal Medicine

## 2015-10-26 ENCOUNTER — Other Ambulatory Visit: Payer: Self-pay | Admitting: Radiology

## 2015-10-26 DIAGNOSIS — R599 Enlarged lymph nodes, unspecified: Secondary | ICD-10-CM

## 2015-10-26 DIAGNOSIS — R590 Localized enlarged lymph nodes: Secondary | ICD-10-CM

## 2015-10-29 ENCOUNTER — Ambulatory Visit (HOSPITAL_COMMUNITY)
Admission: RE | Admit: 2015-10-29 | Discharge: 2015-10-29 | Disposition: A | Discharge status: Home / Self Care | Payer: PPO | Source: Ambulatory Visit | Attending: Internal Medicine | Admitting: Internal Medicine

## 2015-10-29 ENCOUNTER — Encounter (HOSPITAL_COMMUNITY): Payer: Self-pay

## 2015-10-29 DIAGNOSIS — C772 Secondary and unspecified malignant neoplasm of intra-abdominal lymph nodes: Secondary | ICD-10-CM | POA: Diagnosis not present

## 2015-10-29 DIAGNOSIS — C801 Malignant (primary) neoplasm, unspecified: Secondary | ICD-10-CM | POA: Diagnosis not present

## 2015-10-29 DIAGNOSIS — R590 Localized enlarged lymph nodes: Secondary | ICD-10-CM | POA: Diagnosis present

## 2015-10-29 DIAGNOSIS — R599 Enlarged lymph nodes, unspecified: Secondary | ICD-10-CM

## 2015-10-29 LAB — PROTIME-INR
INR: 1.14 (ref 0.00–1.49)
PROTHROMBIN TIME: 14.8 s (ref 11.6–15.2)

## 2015-10-29 LAB — APTT: APTT: 33 s (ref 24–37)

## 2015-10-29 LAB — CBC
HEMATOCRIT: 36.9 % (ref 36.0–46.0)
HEMOGLOBIN: 11.9 g/dL — AB (ref 12.0–15.0)
MCH: 30.5 pg (ref 26.0–34.0)
MCHC: 32.2 g/dL (ref 30.0–36.0)
MCV: 94.6 fL (ref 78.0–100.0)
Platelets: 321 10*3/uL (ref 150–400)
RBC: 3.9 MIL/uL (ref 3.87–5.11)
RDW: 13 % (ref 11.5–15.5)
WBC: 3.7 10*3/uL — AB (ref 4.0–10.5)

## 2015-10-29 MED ORDER — MIDAZOLAM HCL 2 MG/2ML IJ SOLN
INTRAMUSCULAR | Status: AC
  Filled 2015-10-29: qty 2

## 2015-10-29 MED ORDER — HYDROCODONE-ACETAMINOPHEN 5-325 MG PO TABS: 1.0000 | ORAL_TABLET | ORAL | Status: DC | PRN

## 2015-10-29 MED ORDER — SODIUM CHLORIDE 0.9 % IV SOLN: Freq: Once | INTRAVENOUS | Status: DC

## 2015-10-29 MED ORDER — LIDOCAINE HCL (PF) 1 % IJ SOLN
INTRAMUSCULAR | Status: AC
  Filled 2015-10-29: qty 30

## 2015-10-29 MED ORDER — FENTANYL CITRATE (PF) 100 MCG/2ML IJ SOLN
INTRAMUSCULAR | Status: AC
  Filled 2015-10-29: qty 2

## 2015-10-29 MED ORDER — MIDAZOLAM HCL 2 MG/2ML IJ SOLN
INTRAMUSCULAR | Status: AC | PRN
  Administered 2015-10-29: 0.5 mg via INTRAVENOUS

## 2015-10-29 MED ORDER — FENTANYL CITRATE (PF) 100 MCG/2ML IJ SOLN
INTRAMUSCULAR | Status: AC | PRN
  Administered 2015-10-29: 25 ug via INTRAVENOUS

## 2015-10-29 MED ORDER — LIDOCAINE HCL (CARDIAC) 20 MG/ML IV SOLN
INTRAVENOUS | Status: AC
  Filled 2015-10-29: qty 5

## 2015-10-29 NOTE — Procedures (Signed)
CT core and FNA  R retroperit LAN No complication No blood loss. See complete dictation in [facility].

## 2015-10-29 NOTE — H&P (Signed)
Chief Complaint: Patient was seen in consultation today for retroperitoneal lymphadenopthy at the request of [place]  Referring Physician(s): Ramachandran,Ajith  History of Present Illness: Dana Klein is a 81 y.o. female   Pt with off and on Rt abd pain for weeks Work up included CT Abd  Has No Ca hx Nonsmoker  IMPRESSION: New abdominal retroperitoneal lymphadenopathy. Differential diagnosis includes lymphoma and metastatic disease. Consider further evaluation with chest CT with contrast, as well as CT-guided percutaneous needle biopsy for tissue diagnosis.  Now scheduled for Rt Retroperitoneal Lymphadenopthy biopsy   Past Medical History  Diagnosis Date  . Back pain   . Osteoarthritis   . Hearing loss   . Bladder incontinence   . Leg numbness   . Numbness of foot   . Thyroid disease     Past Surgical History  Procedure Laterality Date  . Left wrist surgery    . Carpal tunnel release    . Back surgery    . Abdominal hysterectomy      Allergies: Propranolol and Sulfa antibiotics  Medications: Prior to Admission medications   Medication Sig Start Date End Date Taking? Authorizing Provider  acetaminophen (TYLENOL) 500 MG tablet Take 500 mg by mouth 2 (two) times daily with a meal.   Yes Historical Provider, MD  aspirin EC 81 MG tablet Take 81 mg by mouth 2 (two) times a week.   Yes Historical Provider, MD  Calcium Carbonate-Vitamin D (CALCIUM-D PO) Take 1 tablet by mouth 2 (two) times daily with a meal. Triple Strength   Yes Historical Provider, MD  docusate sodium (COLACE) 100 MG capsule Take 100 mg by mouth 2 (two) times daily with a meal.   Yes Historical Provider, MD  DULoxetine (CYMBALTA) 30 MG capsule Take 30 mg by mouth daily. 12/20/14  Yes Historical Provider, MD  gabapentin (NEURONTIN) 300 MG capsule Take one capsule in the morning, one in the afternoon and 2 in the evening Patient taking differently: Take 300-600 mg by mouth 3 (three)  times daily. Take 1 capsule (300 mg) by mouth every morning and at 2 pm, take 2 capsules (600 mg) at bedtime 07/30/15  Yes Melvenia Beam, MD  levothyroxine (SYNTHROID, LEVOTHROID) 50 MCG tablet Take 50 mcg by mouth daily before breakfast.   Yes Historical Provider, MD  magnesium oxide (MAG-OX) 400 MG tablet Take 400 mg by mouth at bedtime.    Yes Historical Provider, MD  meloxicam (MOBIC) 15 MG tablet Take 7.5 mg by mouth 2 (two) times daily with a meal.    Yes Historical Provider, MD  Multiple Vitamin (MULTIVITAMIN WITH MINERALS) TABS tablet Take 1 tablet by mouth daily.   Yes Historical Provider, MD  Omega-3 Fatty Acids (FISH OIL) 1000 MG CAPS Take 1,000 mg by mouth 2 (two) times daily with a meal.   Yes Historical Provider, MD  Polyethyl Glycol-Propyl Glycol (SYSTANE ULTRA OP) Place 1 drop into both eyes daily.   Yes Historical Provider, MD  Polyethyl Glycol-Propyl Glycol (SYSTANE) 0.4-0.3 % GEL ophthalmic gel Place 1 application into both eyes at bedtime.   Yes Historical Provider, MD  primidone (MYSOLINE) 50 MG tablet TAKE 1/2 TABLET BY MOUTH AT BEDTIME. 10/12/15  Yes Melvenia Beam, MD  rOPINIRole (REQUIP) 1 MG tablet Take 0.5 mg by mouth every evening. 3 hours before bedtime 08/18/13  Yes Historical Provider, MD  solifenacin (VESICARE) 10 MG tablet Take 10 mg by mouth daily.    Yes Historical Provider, MD  gabapentin (NEURONTIN)  300 MG capsule Take one capsule in the morning, one in the afternoon and 2 in the evening Patient not taking: Reported on 10/26/2015 07/19/15   Melvenia Beam, MD  hydrOXYzine (ATARAX/VISTARIL) 10 MG tablet Take 1 tablet (10 mg total) by mouth every 4 (four) hours as needed for itching. Patient not taking: Reported on 10/26/2015 02/02/15   Liam Graham, PA-C     Family History  Problem Relation Age of Onset  . Lung cancer Mother   . Colon cancer Mother   . Pneumonia Father   . Heart defect Father   . Diabetes Sister   . Diabetes Brother   . Diabetes Sister     . Diabetes Brother     Social History   Social History  . Marital Status: Widowed    Spouse Name: N/A  . Number of Children: 1  . Years of Education: 12   Occupational History  .      Retired   Social History Main Topics  . Smoking status: Never Smoker   . Smokeless tobacco: Never Used  . Alcohol Use: No  . Drug Use: No  . Sexual Activity: Not Asked   Other Topics Concern  . None   Social History Narrative   Patient is a widow . Patient is retired and has a Dance movement psychotherapist.   Right handed.   Caffeine- One cup daily coffee.    Review of Systems: A 12 point ROS discussed and pertinent positives are indicated in the HPI above.  All other systems are negative.  Review of Systems  Constitutional: Positive for appetite change. Negative for fever, activity change and fatigue.  Respiratory: Negative for shortness of breath.   Gastrointestinal: Positive for abdominal pain.  Genitourinary: Negative for difficulty urinating.  Neurological: Negative for weakness.  Psychiatric/Behavioral: Negative for behavioral problems and confusion.    Vital Signs: BP 186/86 mmHg  Pulse 61  Temp(Src) 97.7 F (36.5 C) (Oral)  Resp 16  Ht 5' (1.524 m)  Wt 104 lb (47.174 kg)  BMI 20.31 kg/m2  SpO2 98%  Physical Exam  Cardiovascular: Normal rate.   Pulmonary/Chest: Effort normal. She has no wheezes.  Abdominal: Soft. She exhibits no distension. There is no tenderness.  Musculoskeletal: Normal range of motion.  Neurological: She is alert.  Skin: Skin is warm and dry.  Psychiatric: She has a normal mood and affect. Her behavior is normal. Judgment and thought content normal.  Nursing note and vitals reviewed.   Mallampati Score:  MD Evaluation Airway: WNL Heart: WNL Abdomen: WNL Chest/ Lungs: WNL ASA  Classification: 2 Mallampati/Airway Score: One  Imaging: Ct Chest W Contrast  10/24/2015  CLINICAL DATA:  Adenopathy demonstrated on recent CT abdomen and pelvis EXAM:  CT CHEST WITH CONTRAST TECHNIQUE: Multidetector CT imaging of the chest was performed during intravenous contrast administration. CONTRAST:  79mL OMNIPAQUE IOHEXOL 300 MG/ML  SOLN COMPARISON:  CT abdomen and pelvis October 22, 2015 FINDINGS: There is no parenchymal lung edema or consolidation. There is rather minimal posterior lower lung zone pleural thickening. There is a prominent curvilinear structure in the posterior segment of the right upper lobe which appears to represent a pulmonary varix. Visualized thyroid appears normal. There are a few subcentimeter mediastinal lymph nodes. By size criteria, there is no demonstrable adenopathy in the thoracic region. There there are scattered foci of atherosclerotic calcification in the aorta. There is slight atherosclerotic calcification at the origin of the left common carotid and subclavian arteries. No thoracic  aortic aneurysm or dissection. No major vessel pulmonary embolus. Pericardium is not thickened. There are foci of atherosclerotic calcification in the aorta. In the visualized upper abdomen, there is extensive retroperitoneal adenopathy as knee was noted on recent CT abdomen. This adenopathy is incompletely visualized on this CT examination. There is atherosclerotic calcification in the aorta. There is moderate anterior wedging of the T6 vertebral body. There is superior endplate concavity at L2. There are no blastic or lytic bone lesions. IMPRESSION: No demonstrable thoracic adenopathy. Adenopathy in the upper abdomen is noted, a finding that remains concerning for neoplasm. 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 No parenchymal lung mass seen. No lung edema or consolidation. Scattered foci of atherosclerotic calcification noted. Areas of coronary artery calcification noted. Moderate anterior wedging of the T6 vertebral body is noted. Electronically Signed   By: Lowella Grip III M.D.   On: 10/24/2015 14:30   Ct Abdomen Pelvis W  Contrast  10/22/2015  CLINICAL DATA:  Right lower quadrant pain. Clinical suspicion for appendicitis. EXAM: CT ABDOMEN AND PELVIS WITH CONTRAST TECHNIQUE: Multidetector CT imaging of the abdomen and pelvis was performed using the standard protocol following bolus administration of intravenous contrast. CONTRAST:  70mL OMNIPAQUE IOHEXOL 300 MG/ML  SOLN COMPARISON:  10/31/2010 FINDINGS: Lower chest:  No acute findings. Hepatobiliary: No masses or other significant abnormality. Gallbladder is unremarkable. Pancreas: No mass, inflammatory changes, or other significant abnormality. Spleen: Within normal limits in size and appearance. Adrenals/Urinary Tract: No masses identified. No evidence of hydronephrosis. Stomach/Bowel: No evidence of bowel obstruction. Moderate colonic stool burden noted. Appendix is not well visualized on this exam, however there is no definite evidence of inflammatory process in the area the cecum or elsewhere within the abdomen or pelvis. Vascular/Lymphatic: No evidence of abdominal aortic aneurysm. Right-sided abdominal retroperitoneal lymphadenopathy is seen in the retrocaval and aortocaval spaces. Largest area in the retrocaval space measures 2.8 cm in short axis on image 28/series 2. This is new since previous study. A new 1 cm left para-aortic retroperitoneal lymph node is also seen on image 33. No pelvic lymphadenopathy identified. Reproductive: Prior hysterectomy noted. Adnexal regions are unremarkable in appearance. Other: None. Musculoskeletal:  No suspicious bone lesions identified. IMPRESSION: New abdominal retroperitoneal lymphadenopathy. Differential diagnosis includes lymphoma and metastatic disease. Consider further evaluation with chest CT with contrast, as well as CT-guided percutaneous needle biopsy for tissue diagnosis. Electronically Signed   By: Earle Gell M.D.   On: 10/22/2015 21:31    Labs:  CBC:  Recent Labs  02/02/15 1741 02/02/15 1800 10/29/15 0759  WBC  --   7.1 3.7*  HGB 14.3 13.0 11.9*  HCT 42.0 38.6 36.9  PLT  --  241 321    COAGS: No results for input(s): INR, APTT in the last 8760 hours.  BMP:  Recent Labs  02/02/15 1741 10/22/15 1800  NA 131*  --   K 4.6  --   CL 99  --   GLUCOSE 107*  --   BUN 30* 15  CREATININE 0.80 0.47  GFRNONAA  --  >60  GFRAA  --  >60    LIVER FUNCTION TESTS: No results for input(s): BILITOT, AST, ALT, ALKPHOS, PROT, ALBUMIN in the last 8760 hours.  TUMOR MARKERS: No results for input(s): AFPTM, CEA, CA199, CHROMGRNA in the last 8760 hours.  Assessment and Plan:  abd pain off/on for few weeks Persistent x last several days (although none today) CT Abd reveals enlarged Rt retroperitoneal lymph nodes Now scheduled for  bx of same Risks and Benefits discussed with the patient including, but not limited to bleeding, infection, damage to adjacent structures or low yield requiring additional tests. All of the patient's questions were answered, patient is agreeable to proceed. Consent signed and in chart.   Thank you for this interesting consult.  I greatly enjoyed meeting Dana Klein and look forward to participating in their care.  A copy of this report was sent to the requesting provider on this date.  Signed: Jsaon Yoo A 10/29/2015, 8:23 AM   I spent a total of  30 Minutes   in face to face in clinical consultation, greater than 50% of which was counseling/coordinating care for Rt RP LAN bx

## 2015-10-29 NOTE — Discharge Instructions (Signed)
Needle Biopsy, Care After °These instructions give you information about caring for yourself after your procedure. Your doctor may also give you more specific instructions. Call your doctor if you have any problems or questions after your procedure. °HOME CARE °· Rest as told by your doctor. °· Take medicines only as told by your doctor. °· There are many different ways to close and cover the biopsy site, including stitches (sutures), skin glue, and adhesive strips. Follow instructions from your doctor about: °¨ How to take care of your biopsy site. °¨ When and how you should change your bandage (dressing). °¨ When you should remove your dressing. °¨ Removing whatever was used to close your biopsy site. °· Check your biopsy site every day for signs of infection. Watch for: °¨ Redness, swelling, or pain. °¨ Fluid, blood, or pus. °GET HELP IF: °· You have a fever. °· You have redness, swelling, or pain at the biopsy site, and it lasts longer than a few days. °· You have fluid, blood, or pus coming from the biopsy site. °· You feel sick to your stomach (nauseous). °· You throw up (vomit). °GET HELP RIGHT AWAY IF: °· You are short of breath. °· You have trouble breathing. °· Your chest hurts. °· You feel dizzy or you pass out (faint). °· You have bleeding that does not stop with pressure or a bandage. °· You cough up blood. °· Your belly (abdomen) hurts. °  °This information is not intended to replace advice given to you by your health care provider. Make sure you discuss any questions you have with your health care provider. °  °Document Released: 10/16/2008 Document Revised: 03/20/2015 Document Reviewed: 10/30/2014 °[employer] Interactive Patient Education ©2016 [employer] ° °

## 2015-11-02 LAB — TISSUE CULTURE
Culture: NO GROWTH
Gram Stain: NONE SEEN

## 2015-11-08 ENCOUNTER — Telehealth: Payer: Self-pay | Admitting: Oncology

## 2015-11-08 ENCOUNTER — Ambulatory Visit (HOSPITAL_BASED_OUTPATIENT_CLINIC_OR_DEPARTMENT_OTHER): Payer: PPO | Admitting: Oncology

## 2015-11-08 VITALS — BP 167/64 | HR 74 | Temp 98.1°F | Resp 17 | Ht 60.0 in | Wt 104.3 lb

## 2015-11-08 DIAGNOSIS — C7B09 Secondary carcinoid tumors of other sites: Secondary | ICD-10-CM

## 2015-11-08 DIAGNOSIS — C801 Malignant (primary) neoplasm, unspecified: Secondary | ICD-10-CM | POA: Diagnosis not present

## 2015-11-08 DIAGNOSIS — C772 Secondary and unspecified malignant neoplasm of intra-abdominal lymph nodes: Secondary | ICD-10-CM

## 2015-11-08 NOTE — Consult Note (Signed)
Reason for Referral: Metastatic carcinoma.   HPI: 81 year old woman currently of [place] where she lived the majority of her life. She does have history of osteoarthritis and peripheral neuropathy and follows with neurology regarding tremors that was thought to be related to Parkinson's but subsequently deemed to be essential tremors. She is up-to-date on her health maintenance including a colonoscopy as well as cervical cancer screening. She had a hysterectomy and oophorectomy in the past and was found to have stage I endometrial cancer in June 2010. Her tumor was rather superficial and no further therapy was needed. She started developing lower abdominal pain and subsequently underwent CT scan of the abdomen and pelvis on 10/22/2015. A CT scan showed right-sided abdominal retroperitoneal lymphadenopathy and the retrocaval and the ureter caval space. The largest of which measuring 2.8 cm. There is a 1 cm left periaortic retroperitoneal lymph node was also noted. CT scan of the chest did not show any evidence of malignancy at this time. She underwent a CT-guided biopsy on 10/29/2015 which showed poorly differentiated carcinoma from unknown primary. Differential diagnosis including lung, GI as well as pancreatic biliary etiology. Patient referred to me for evaluation regarding these findings. At this time, she is asymptomatic. Her right lower quadrant abdominal pain has resolved. She is not reporting any change in her bowel habits just constipation or diarrhea. Does not report any constitutional symptoms of weight loss. She continues to live independently although her daughter checks on her periodically. She does not drive but attends to some activities of daily living. She is limited by peripheral neuropathy in her mobility and does use a cane. She had multiple falls including injuring her wrists on few occasions.  She does not report any headaches, blurry vision, syncope or seizures. She does not report  any fevers, chills or sweats. Does not report any cough, wheezing, hemoptysis or dyspnea on exertion. Does not report any chest pain, palpitation, orthopnea or leg edema. Does not report any nausea, vomiting or abdominal pain. Does not report any constipation, diarrhea, hematochezia or melena. She does report occasional discoloration of of her stool with dark stool at times. She does not report any frequency urgency or hesitancy. She does not report any skeletal complaints. Remaining review of systems unremarkable.   Past Medical History  Diagnosis Date  . Back pain   . Osteoarthritis   . Hearing loss   . Bladder incontinence   . Leg numbness   . Numbness of foot   . Thyroid disease   :  Past Surgical History  Procedure Laterality Date  . Left wrist surgery    . Carpal tunnel release    . Back surgery    . Abdominal hysterectomy    :   Current outpatient prescriptions:  .  acetaminophen (TYLENOL) 500 MG tablet, Take 500 mg by mouth 2 (two) times daily with a meal., Disp: , Rfl:  .  aspirin EC 81 MG tablet, Take 81 mg by mouth 2 (two) times a week., Disp: , Rfl:  .  Calcium Carbonate-Vitamin D (CALCIUM-D PO), Take 1 tablet by mouth 2 (two) times daily with a meal. Triple Strength, Disp: , Rfl:  .  docusate sodium (COLACE) 100 MG capsule, Take 100 mg by mouth 2 (two) times daily with a meal., Disp: , Rfl:  .  DULoxetine (CYMBALTA) 30 MG capsule, Take 30 mg by mouth daily., Disp: , Rfl: 12 .  gabapentin (NEURONTIN) 300 MG capsule, Take one capsule in the morning, one in the afternoon  and 2 in the evening (Patient not taking: Reported on 10/26/2015), Disp: 120 capsule, Rfl: 11 .  gabapentin (NEURONTIN) 300 MG capsule, Take one capsule in the morning, one in the afternoon and 2 in the evening (Patient taking differently: Take 300-600 mg by mouth 3 (three) times daily. Take 1 capsule (300 mg) by mouth every morning and at 2 pm, take 2 capsules (600 mg) at bedtime), Disp: 120 capsule, Rfl: 11 .   hydrOXYzine (ATARAX/VISTARIL) 10 MG tablet, Take 1 tablet (10 mg total) by mouth every 4 (four) hours as needed for itching. (Patient not taking: Reported on 10/26/2015), Disp: 20 tablet, Rfl: 0 .  levothyroxine (SYNTHROID, LEVOTHROID) 50 MCG tablet, Take 50 mcg by mouth daily before breakfast., Disp: , Rfl:  .  magnesium oxide (MAG-OX) 400 MG tablet, Take 400 mg by mouth at bedtime. , Disp: , Rfl:  .  meloxicam (MOBIC) 15 MG tablet, Take 7.5 mg by mouth 2 (two) times daily with a meal. , Disp: , Rfl:  .  Multiple Vitamin (MULTIVITAMIN WITH MINERALS) TABS tablet, Take 1 tablet by mouth daily., Disp: , Rfl:  .  Omega-3 Fatty Acids (FISH OIL) 1000 MG CAPS, Take 1,000 mg by mouth 2 (two) times daily with a meal., Disp: , Rfl:  .  Polyethyl Glycol-Propyl Glycol (SYSTANE ULTRA OP), Place 1 drop into both eyes daily., Disp: , Rfl:  .  Polyethyl Glycol-Propyl Glycol (SYSTANE) 0.4-0.3 % GEL ophthalmic gel, Place 1 application into both eyes at bedtime., Disp: , Rfl:  .  primidone (MYSOLINE) 50 MG tablet, TAKE 1/2 TABLET BY MOUTH AT BEDTIME., Disp: 15 tablet, Rfl: 6 .  rOPINIRole (REQUIP) 1 MG tablet, Take 0.5 mg by mouth every evening. 3 hours before bedtime, Disp: , Rfl:  .  solifenacin (VESICARE) 10 MG tablet, Take 10 mg by mouth daily. , Disp: , Rfl: :  Allergies  Allergen Reactions  . Propranolol Itching and Rash    Rash All over   . Sulfa Antibiotics Rash  :  Family History  Problem Relation Age of Onset  . Lung cancer Mother   . Colon cancer Mother   . Pneumonia Father   . Heart defect Father   . Diabetes Sister   . Diabetes Brother   . Diabetes Sister   . Diabetes Brother   :  Social History   Social History  . Marital Status: Widowed    Spouse Name: N/A  . Number of Children: 1  . Years of Education: 12   Occupational History  .      Retired   Social History Main Topics  . Smoking status: Never Smoker   . Smokeless tobacco: Never Used  . Alcohol Use: No  . Drug Use: No   . Sexual Activity: Not on file   Other Topics Concern  . Not on file   Social History Narrative   Patient is a widow . Patient is retired and has a Dance movement psychotherapist.   Right handed.   Caffeine- One cup daily coffee.  :  Pertinent items are noted in HPI.  Exam: Blood pressure 167/64, pulse 74, temperature 98.1 F (36.7 C), temperature source Oral, resp. rate 17, height 5' (1.524 m), weight 104 lb 4.8 oz (47.31 kg), SpO2 100 %. General appearance: alert and cooperative Head: Normocephalic, without obvious abnormality Throat: lips, mucosa, and tongue normal; teeth and gums normal Neck: no adenopathy Back: negative Resp: clear to auscultation bilaterally Chest wall: no tenderness Cardio: regular rate and rhythm,  S1, S2 normal, no murmur, click, rub or gallop GI: soft, non-tender; bowel sounds normal; no masses,  no organomegaly Extremities: extremities normal, atraumatic, no cyanosis or edema Pulses: 2+ and symmetric  CBC    Component Value Date/Time   WBC 3.7* 10/29/2015 0759   RBC 3.90 10/29/2015 0759   HGB 11.9* 10/29/2015 0759   HCT 36.9 10/29/2015 0759   PLT 321 10/29/2015 0759   MCV 94.6 10/29/2015 0759   MCH 30.5 10/29/2015 0759   MCHC 32.2 10/29/2015 0759   RDW 13.0 10/29/2015 0759   LYMPHSABS 0.9 02/02/2015 1800   MONOABS 0.4 02/02/2015 1800   EOSABS 0.4 02/02/2015 1800   BASOSABS 0.0 02/02/2015 1800      Chemistry      Component Value Date/Time   NA 131* 02/02/2015 1741   K 4.6 02/02/2015 1741   CL 99 02/02/2015 1741   CO2 26 05/02/2009 0550   BUN 15 10/22/2015 1800   CREATININE 0.47 10/22/2015 1800      Component Value Date/Time   CALCIUM 8.2* 05/02/2009 0550   ALKPHOS 54 04/27/2009 1340   AST 18 04/27/2009 1340   ALT 15 04/27/2009 1340   BILITOT 0.5 04/27/2009 1340       Ct Chest W Contrast  10/24/2015  CLINICAL DATA:  Adenopathy demonstrated on recent CT abdomen and pelvis EXAM: CT CHEST WITH CONTRAST TECHNIQUE: Multidetector CT  imaging of the chest was performed during intravenous contrast administration. CONTRAST:  59mL OMNIPAQUE IOHEXOL 300 MG/ML  SOLN COMPARISON:  CT abdomen and pelvis October 22, 2015 FINDINGS: There is no parenchymal lung edema or consolidation. There is rather minimal posterior lower lung zone pleural thickening. There is a prominent curvilinear structure in the posterior segment of the right upper lobe which appears to represent a pulmonary varix. Visualized thyroid appears normal. There are a few subcentimeter mediastinal lymph nodes. By size criteria, there is no demonstrable adenopathy in the thoracic region. There there are scattered foci of atherosclerotic calcification in the aorta. There is slight atherosclerotic calcification at the origin of the left common carotid and subclavian arteries. No thoracic aortic aneurysm or dissection. No major vessel pulmonary embolus. Pericardium is not thickened. There are foci of atherosclerotic calcification in the aorta. In the visualized upper abdomen, there is extensive retroperitoneal adenopathy as knee was noted on recent CT abdomen. This adenopathy is incompletely visualized on this CT examination. There is atherosclerotic calcification in the aorta. There is moderate anterior wedging of the T6 vertebral body. There is superior endplate concavity at L2. There are no blastic or lytic bone lesions. IMPRESSION: No demonstrable thoracic adenopathy. Adenopathy in the upper abdomen is noted, a finding that remains concerning for neoplasm. 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 No parenchymal lung mass seen. No lung edema or consolidation. Scattered foci of atherosclerotic calcification noted. Areas of coronary artery calcification noted. Moderate anterior wedging of the T6 vertebral body is noted. Electronically Signed   By: Lowella Grip III M.D.   On: 10/24/2015 14:30   Ct Abdomen Pelvis W Contrast  10/22/2015  CLINICAL DATA:  Right lower quadrant pain.  Clinical suspicion for appendicitis. EXAM: CT ABDOMEN AND PELVIS WITH CONTRAST TECHNIQUE: Multidetector CT imaging of the abdomen and pelvis was performed using the standard protocol following bolus administration of intravenous contrast. CONTRAST:  77mL OMNIPAQUE IOHEXOL 300 MG/ML  SOLN COMPARISON:  10/31/2010 FINDINGS: Lower chest:  No acute findings. Hepatobiliary: No masses or other significant abnormality. Gallbladder is unremarkable. Pancreas: No mass, inflammatory changes,  or other significant abnormality. Spleen: Within normal limits in size and appearance. Adrenals/Urinary Tract: No masses identified. No evidence of hydronephrosis. Stomach/Bowel: No evidence of bowel obstruction. Moderate colonic stool burden noted. Appendix is not well visualized on this exam, however there is no definite evidence of inflammatory process in the area the cecum or elsewhere within the abdomen or pelvis. Vascular/Lymphatic: No evidence of abdominal aortic aneurysm. Right-sided abdominal retroperitoneal lymphadenopathy is seen in the retrocaval and aortocaval spaces. Largest area in the retrocaval space measures 2.8 cm in short axis on image 28/series 2. This is new since previous study. A new 1 cm left para-aortic retroperitoneal lymph node is also seen on image 33. No pelvic lymphadenopathy identified. Reproductive: Prior hysterectomy noted. Adnexal regions are unremarkable in appearance. Other: None. Musculoskeletal:  No suspicious bone lesions identified. IMPRESSION: New abdominal retroperitoneal lymphadenopathy. Differential diagnosis includes lymphoma and metastatic disease. Consider further evaluation with chest CT with contrast, as well as CT-guided percutaneous needle biopsy for tissue diagnosis. Electronically Signed   By: Earle Gell M.D.   On: 10/22/2015 21:31   Ct Biopsy  10/29/2015  CLINICAL DATA:  Right retroperitoneal adenopathy.  No known primary. EXAM: CT GUIDED CORE AND FNA BIOPSY OF RETROPERITONEAL  ADENOPATHY ANESTHESIA/SEDATION: Intravenous Fentanyl and Versed were administered as conscious sedation during continuous cardiorespiratory monitoring by the radiology RN, with a total moderate sedation time of 10 minutes. PROCEDURE: The procedure risks, benefits, and alternatives were explained to the patient. Questions regarding the procedure were encouraged and answered. The patient understands and consents to the procedure. Patient placed prone. Select axial scans through the mid abdomen were obtained. The adenopathy was localized and an appropriate skin site was determined and marked. The operative field was prepped with chlorhexidinein a sterile fashion, and a sterile drape was applied covering the operative field. A sterile gown and sterile gloves were used for the procedure. Local anesthesia was provided with 1% Lidocaine. Under CT fluoroscopic guidance, a 17 gauge trocar needle was advanced to the margin of the lesion. Once needle tip position was confirmed, coaxial 18-gauge core biopsy samples were obtained, submitted in saline to surgical pathology. The guide needle was advanced into the low-attenuation central aspect of the dominant node for aspiration, returning a scant amount of bloody fluid, sent for culture. The guide needle was removed. Postprocedure scans show no hemorrhage or other apparent complication. COMPLICATIONS: None immediate FINDINGS: Right retroperitoneal adenopathy again localized. Core and aspiration samples obtained as above. IMPRESSION: 1. Technically successful CT-guided core and aspiration biopsy of right retroperitoneal adenopathy, sent for surg path and culture. Electronically Signed   By: Lucrezia Europe M.D.   On: 10/29/2015 13:08    Assessment and Plan:   80 year old woman with the following issues:  1. Metastatic carcinoma of unknown primary. It is unclear if this is a metastatic from a GI, GU or pancreatic biliary tract. Her set of disease include retroperitoneal  adenopathy with the largest of which around 2.8 cm. The natural course of this disease was discussed and different treatment modalities were reviewed. I do not see any great value of exhaustive investigation to determine the primary tumor at this time. I do not think it wil alter our management. I see no need for colonoscopy or a cystoscopy at this time unless she develops any symptoms.  There is no role for surgery at this time and likely palliative chemotherapy can be introduced if she develops symptoms related to her malignancy. I recommended a period of observation and repeat PET  scan in 2 months to assess the extent of disease. PET scan will allow [place] to determine the etiology of the cancer also the extent of the disease. She develops symptomatic progression at that time, palliative chemotherapy could be utilized.  I have advised against chemotherapy at this time given the fact that she is completely asymptomatic and her disease bulk is very limited. All her questions were answered today and this information will be discussed with her daughter as well.  2. Follow-up: Will be in 2 months after repeat a PET scan.

## 2015-11-08 NOTE — Telephone Encounter (Signed)
Gave pt appts for 01/22/16 + 01/24/16. Advised pt c-sched will call to make PET scan appt. Pt verbalized understanding.

## 2015-11-08 NOTE — Progress Notes (Signed)
Please see consult note.  

## 2015-11-20 ENCOUNTER — Ambulatory Visit (INDEPENDENT_AMBULATORY_CARE_PROVIDER_SITE_OTHER): Payer: PPO | Admitting: Podiatry

## 2015-11-20 ENCOUNTER — Encounter: Payer: Self-pay | Admitting: Podiatry

## 2015-11-20 DIAGNOSIS — B351 Tinea unguium: Secondary | ICD-10-CM

## 2015-11-20 DIAGNOSIS — M79676 Pain in unspecified toe(s): Secondary | ICD-10-CM | POA: Diagnosis not present

## 2015-11-20 NOTE — Progress Notes (Signed)
Patient ID: Dana Klein, female   DOB: 05/09/1928, 82 y.o.   MRN: [id number] Complaint:  Visit Type: Patient returns to my office for continued preventative foot care services. Complaint: Patient states" my nails have grown long and thick and become painful to walk and wear shoes..She presents for preventative foot care services. No changes to ROS. She says she has been diagnosed with essentail trermors.  Vascular: dorsalis pedis and posterior tibial pulses are palpable bilateral. Capillary return is immediate. Temperature gradient is WNL. Skin turgor WNL  Sensorium: Normal Semmes Weinstein monofilament test. Normal tactile sensation bilaterally. Nail Exam: Pt has thick disfigured discolored nails with subungual debris noted bilateral entire nail hallux through fifth toenails Ulcer Exam: There is no evidence of ulcer or pre-ulcerative changes or infection. Orthopedic Exam: Muscle tone and strength are WNL. No limitations in general ROM. No crepitus or effusions noted. Foot type and digits show no abnormalities. Bony prominences are unremarkable. Skin: No Porokeratosis. No infection or ulcers  Diagnosis:  Tinea unguium, Pain in right toe, pain in left toes  Treatment & Plan Procedures and Treatment: Consent by patient was obtained for treatment procedures. The patient understood the discussion of treatment and procedures well. All questions were answered thoroughly reviewed. Debridement of mycotic and hypertrophic toenails, 1 through 5 bilateral and clearing of subungual debris. No ulceration, no infection noted.  Return Visit-Office Procedure: Patient instructed to return to the office for a follow up visit 3 months for continued evaluation and treatment. 

## 2015-11-23 DIAGNOSIS — J069 Acute upper respiratory infection, unspecified: Secondary | ICD-10-CM | POA: Diagnosis not present

## 2015-11-28 IMAGING — CR DG WRIST COMPLETE 3+V*R*
5 series · 5 of 5 positions shown · non-contrast
Comparison: None.

CLINICAL DATA: Acute wrist pain and swelling following a recent
fall earlier today down stairs.

EXAM:
RIGHT WRIST - COMPLETE 3+ VIEW

[wrist pa]
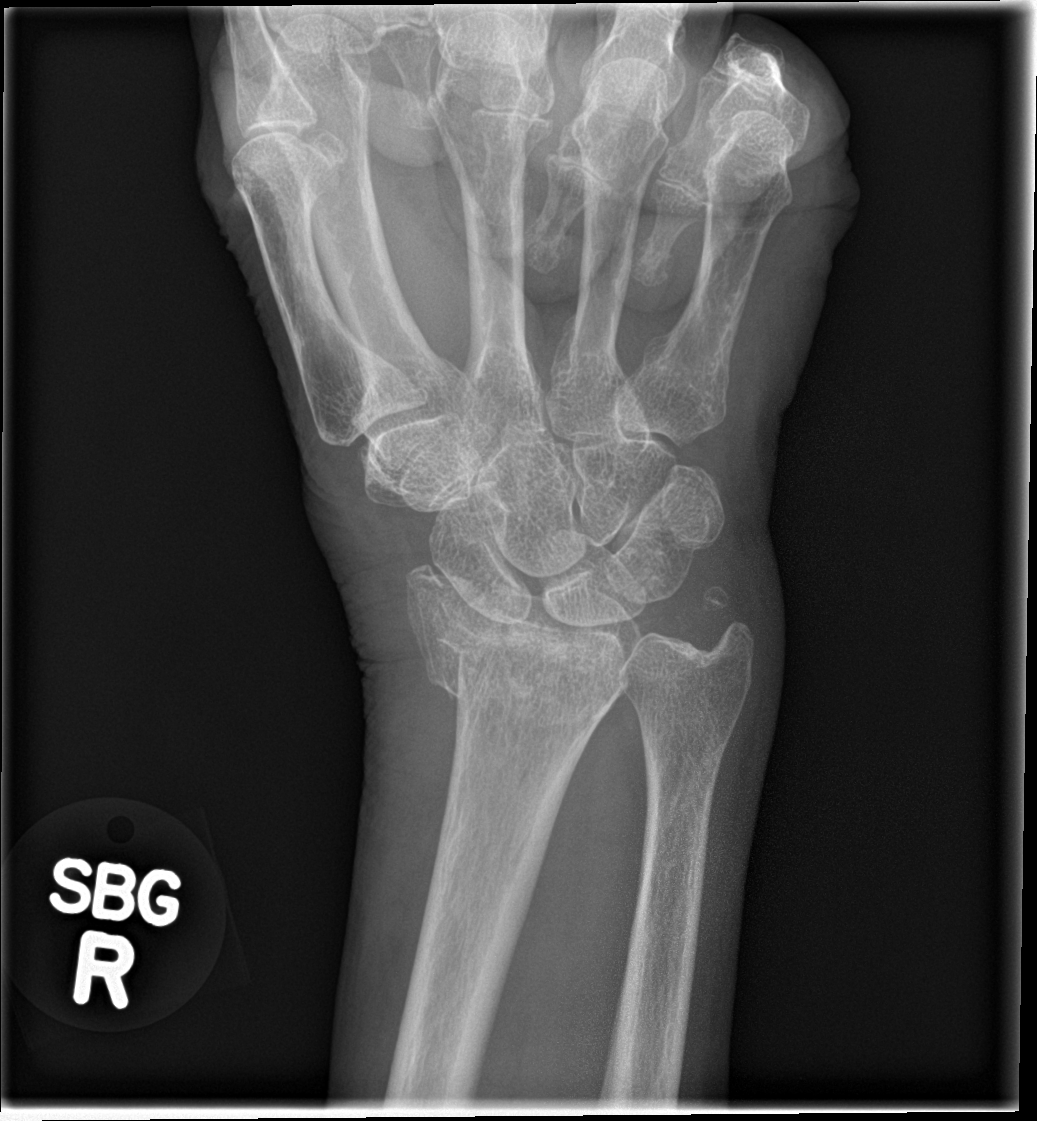

[wrist obl]
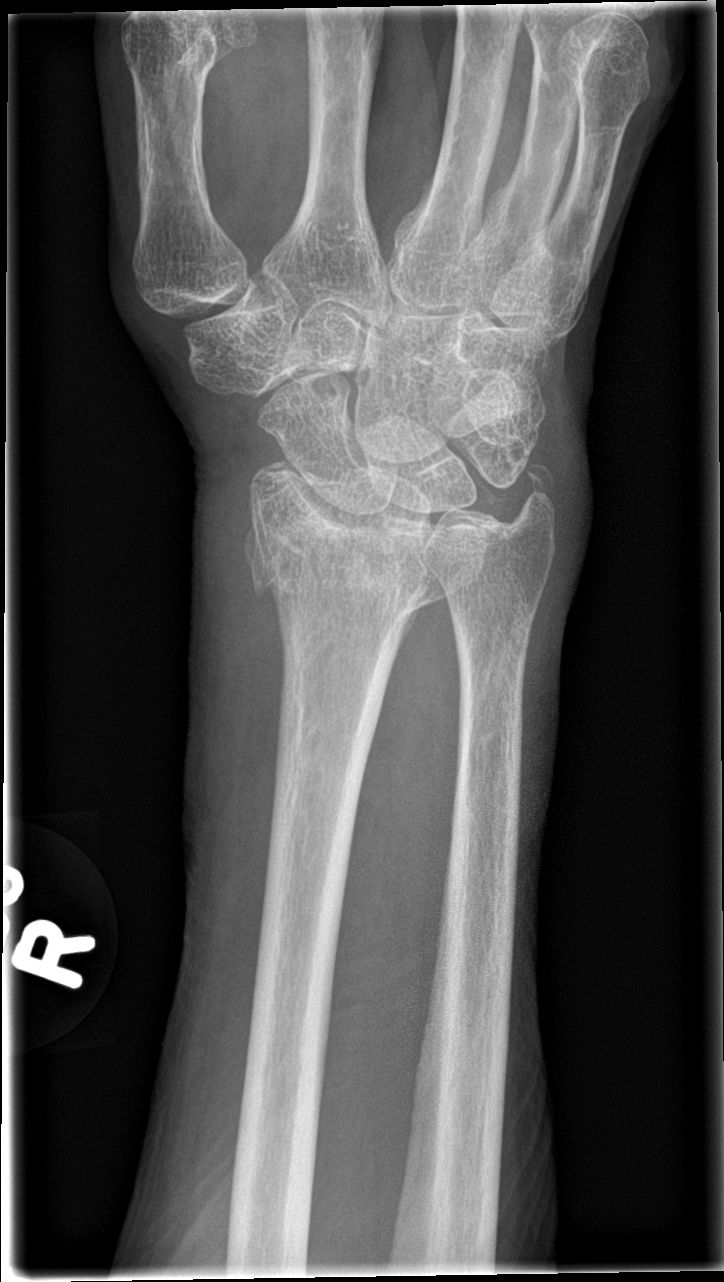

[wrist lat (1 of 2)]
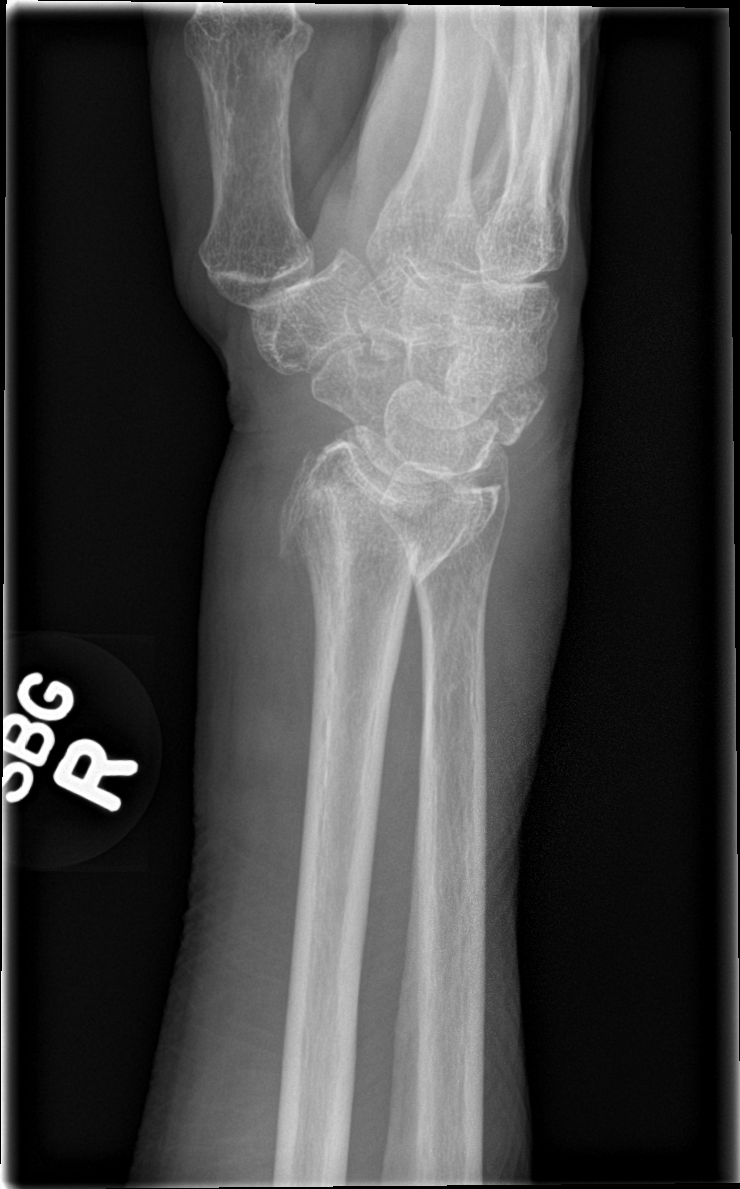

[wrist navicular]
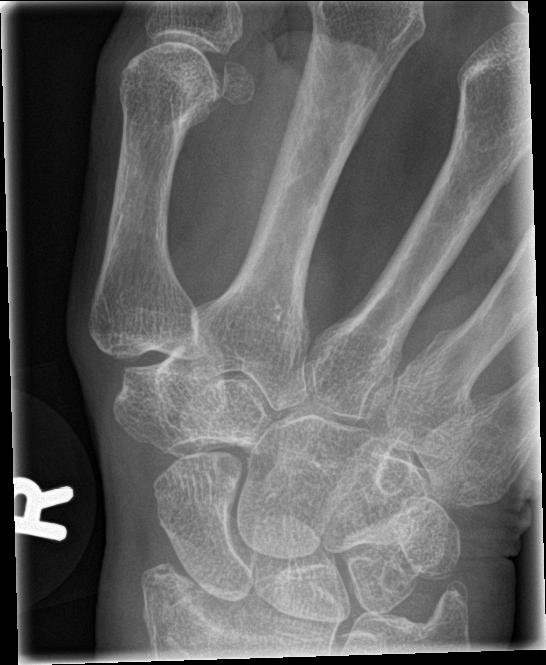

[wrist lat (2 of 2)]
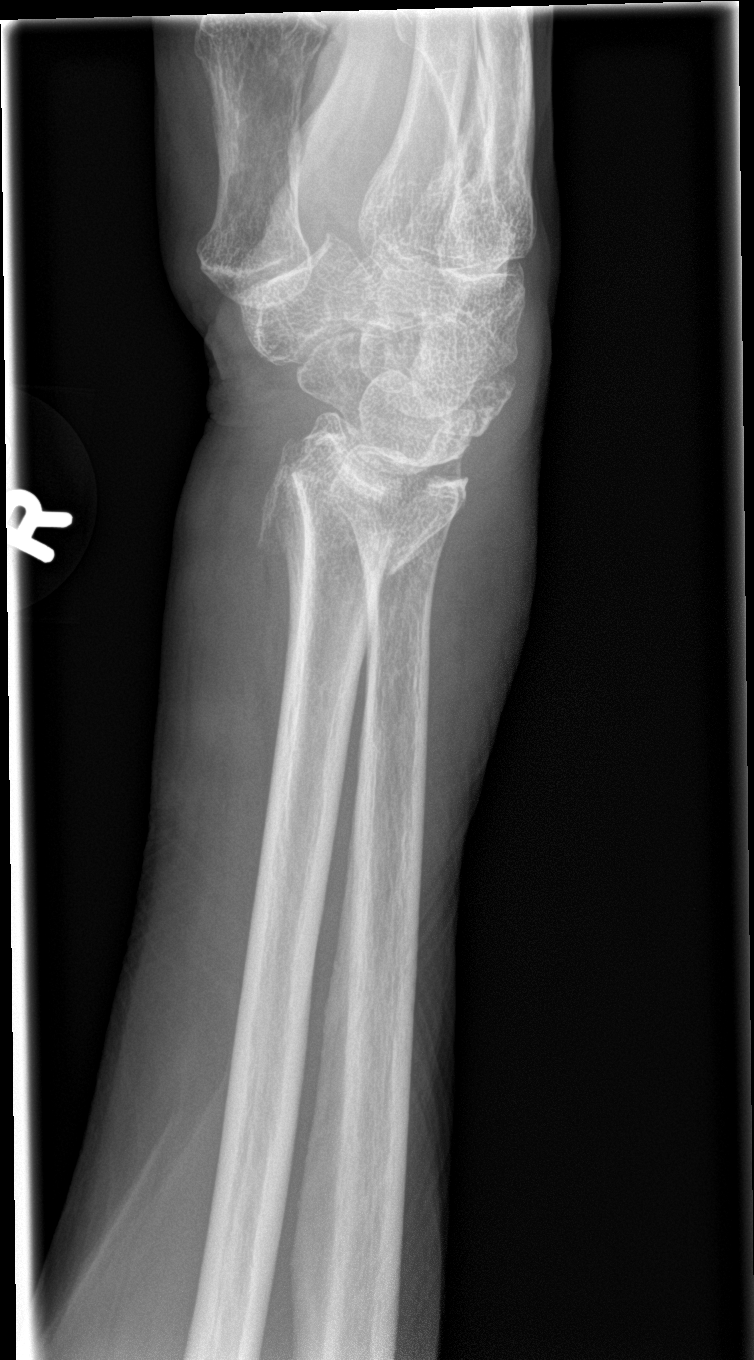

[5 of 5 positions shown; findings below may reference images not displayed]

FINDINGS: There is an acute impacted displaced fracture of the right distal
radius involving the articular surface. Slight dorsal displacement
and angulation.

Small ulnar styloid corticated ossicle evident, suspect remote
avulsion of the ulnar styloid. Soft tissue swelling about the wrist.
Carpal bones appear intact. Bones are osteopenic.
IMPRESSION: Acute dorsally displaced intra-articular fracture of the right
distal radius.

## 2016-01-16 ENCOUNTER — Ambulatory Visit (INDEPENDENT_AMBULATORY_CARE_PROVIDER_SITE_OTHER): Payer: PPO | Admitting: Neurology

## 2016-01-16 ENCOUNTER — Encounter: Payer: Self-pay | Admitting: Neurology

## 2016-01-16 VITALS — BP 149/72 | HR 74 | Ht 60.0 in | Wt 104.8 lb

## 2016-01-16 DIAGNOSIS — G250 Essential tremor: Secondary | ICD-10-CM

## 2016-01-16 MED ORDER — PRIMIDONE 50 MG PO TABS: 100.0000 mg | ORAL_TABLET | Freq: Every day | ORAL | Status: DC

## 2016-01-16 NOTE — Patient Instructions (Addendum)
Remember to drink plenty of fluid, eat healthy meals and do not skip any meals. Try to eat protein with a every meal and eat a healthy snack such as fruit or nuts in between meals. Try to keep a regular sleep-wake schedule and try to exercise daily, particularly in the form of walking, 20-30 minutes a day, if you can.   As far as your medications are concerned, I would like to suggest. Increase by 1/2 a pill at night every 2 weeks until taking up to 2 pills at night.   I would like to see you back in 3 months, sooner if we need to. Please call us with any interim questions, concerns, problems, updates or refill requests.   Our phone number is [phone]. We also have an after hours call service for urgent matters and there is a physician on-call for urgent questions. For any emergencies you know to call 911 or go to the nearest emergency room

## 2016-01-16 NOTE — Progress Notes (Signed)
[name]    Provider: Dr Jaynee Eagles Referring Provider: Tommy Medal, MD Primary Care Physician: Tommy Medal, MD  CC: tremor  Interval history 01/16/2016: Dana Klein here for follow up on Tremor is worsening and now involving her chin worse. She is only on 1/2 pill of 50mg  Primidone. Will slowly increase as tolerated. Patient can increase to a pill at night for 1-2 weeks and then go up by 1/2 a pill every few weeks as tolerated, just be careful for sedation ans side effects and risk of falls especially if she gets up in the middle of the night. She is going through testing for possible cancer, she is going to have a PET scan soon. No more falls, not getting on the roof or cleaning anymore. She is still cleaning up leaves and getting in the year and taking care of her house.She has some difficulty relaxing her legs. But her tone feels good without spasticity or anything that appears pathologic. She has some stiffness in the legs and ankles and joints due to osteoarthritis. Advised if she is in the kitchen make sure she has the spongy matts to help instead of standing on something hard, just ensure they are not slippery and well attached to the floor. She has some imbalance, advised her to use her cane and walker all the time. Also get up slowly. She has back pain and osteoarthritis as well contributing to her imbalance.  She wears her back brace all the time when doing activities.    Interval history 07/19/2015: She was evaluated by Dr. Rexene Alberts in May who confirmed essential tremor. The tremor is still there. She is still taking the primidone at night. Her legs hurt, they are stiff and her feet hurt. She denies any sensory changes, no burning or tingling. They are very stiff. She has some redness and discoloration on her feet. She feels stiffness and swelling in the lower extremity. At the end of the day they are swollen. No cramping in the feet. Movement of the toes hurts, she keeps saying  stiffness. Denies tingling, burning. It hurts all the time. They don't keep her awake. She is taking the neurontin twice daily. She is tolerating the neurontin really well. Will increase it to three times a day. One am, one afternoon and 2 pm. Discussed with patient that the symptoms in her feet are more likely due to osteoarthritis then neuropathy. I do recommend she follow up with her primary care. Can increase Neurontin to 3 times a day and see if that helps. We'll continue on current dose of primidone due to sedation risk, also Neurontin is good for essential tremor so increasing that may help as well.   Interval history 03/07/2015: ISMELDA CRUPI is a 82 y.o. female here as a follow up. She fell down the stairs. Broke her arm. She was hospitalized. Reviewed images of her brain that were done while she was in the hospital. She is taking 25mg  of the primidone. She will start taking the whole tablet.   MRI of the brain: personally reviewed images of the brain and agree with the following:  IMPRESSION: No acute brain infarction. The cerebellum does not show any focal insult.  Mild age related atrophy and chronic small vessel change, less than often seen in healthy individuals of this age.  Some inflammatory change of the maxillary sinuses. Small amount of fluid in the mastoid air cells left more than right.  HPI: [name] is a 82 y.o. female here as  a follow up.  She worked out in the yard and is having pain in the anterior legs likely from overuse. She is a little slow, she hasn't been walking outside as much. Now that the weather is warm she can go back. Daughter denies hypophonia, changes in handwriting. She has hearing difficulty and usually talks too loud. Daughter feels expression is normal but family members have noticed the mild mouth tremor. Sinemet does not help her symptoms.   Emg/ncs 10/20: There is electrophysiologic evidence of a symmetric length-dependent severe  axonal sensorimotor polyneuropathy. In addition, there is evidence of a moderately-severe right median nerve entrapment at the wrist. Decreased insertional activity of distal leg muscles could be due to atrophy. Clinical correlation suggested   Initial visit 08/29/2014: [name] patient who was previously seeing Dr. Janann Colonel and is now following up with me. She was referred for tremor and was diagnosed with PD and is on Sinemet 3 times a day. Took her pill at 8:15 am so it is wearing off now. Tremor significantly improves with the medication. Her handwriting improves with the medication as well. Doesn't pay attention if she has any significant wearing off unless she is sitting reading but usually she is working outside and doesn't notice a tremor in fact was outside last night and just forgot to take her medication. No dyskinesias. Sometimes will fall over if she doesn't catch herself and "goes over". Using the cane when she goes out. At home doesn't use the cane. In the house she is fine, she can hold on and doesn't need it. Lives alone. Has a life alert necklace. Always wears it. Swallowing is fine, no choking. Sleeping well, uses requip for RLS. No rem sleep disorder. Not tired during the day.Notes some difficulty with short term memory, forgetting peoples names. Feels it is mild and not bothersome at this time  Had back surgery in 2010 and experiencing some low back pain and tightness in her leg muscles below the knees. She had a fracture. Back hurts when she bends over. If she wears tennis shoes, feels like a vice around the feet. No bruning, no numbness, no tingling. Elevating the legs help.   Reviewed notes, labs and imaging from outside physicians, which showed: Initial visit 08/2013, tremor started a year previous both hands (unsure if started in one or other or both) and mostly at rest. Feels walking is slow, unsteady, trouble turning. Good sense of smell.   Has insomnia, is stable. Requip and  Neurontin for RLS helps her sleep. Recent labs all unremarkable: cbc, crp, cmp, b12  Review of Systems: Patient complains of symptoms per HPI as well as the following symptoms: Eye itching, hearing loss, constipation, incontinence of bladder, bruising, numbness, tremors, aching muscles. Pertinent negatives per HPI. All others negative.  Social History   Social History  . Marital Status: Widowed    Spouse Name: N/A  . Number of Children: 1  . Years of Education: 12   Occupational History  .      Retired   Social History Main Topics  . Smoking status: Never Smoker   . Smokeless tobacco: Never Used  . Alcohol Use: No  . Drug Use: No  . Sexual Activity: Not on file   Other Topics Concern  . Not on file   Social History Narrative   Patient is a widow . Patient is retired and has a Dance movement psychotherapist.   Right handed.   Caffeine- One cup daily coffee.    Family  History  Problem Relation Age of Onset  . Lung cancer Mother   . Colon cancer Mother   . Pneumonia Father   . Heart defect Father   . Diabetes Sister   . Diabetes Brother   . Diabetes Sister   . Diabetes Brother     Past Medical History  Diagnosis Date  . Back pain   . Osteoarthritis   . Hearing loss   . Bladder incontinence   . Leg numbness   . Numbness of foot   . Thyroid disease     Past Surgical History  Procedure Laterality Date  . Left wrist surgery    . Carpal tunnel release    . Back surgery    . Abdominal hysterectomy      Current Outpatient Prescriptions  Medication Sig Dispense Refill  . acetaminophen (TYLENOL) 500 MG tablet Take 500 mg by mouth 2 (two) times daily with a meal.    . aspirin EC 81 MG tablet Take 81 mg by mouth 2 (two) times a week.    . Calcium Carbonate-Vitamin D (CALCIUM-D PO) Take 1 tablet by mouth 2 (two) times daily with a meal. Triple Strength    . docusate sodium (COLACE) 100 MG capsule Take 100 mg by mouth 2 (two) times daily with a meal. As needed for  constipation    . DULoxetine (CYMBALTA) 30 MG capsule Take 30 mg by mouth daily.  12  . gabapentin (NEURONTIN) 300 MG capsule Take one capsule in the morning, one in the afternoon and 2 in the evening 120 capsule 11  . levothyroxine (SYNTHROID, LEVOTHROID) 50 MCG tablet Take 50 mcg by mouth daily before breakfast.    . magnesium oxide (MAG-OX) 400 MG tablet Take 400 mg by mouth at bedtime.     . meloxicam (MOBIC) 15 MG tablet Take 7.5 mg by mouth 2 (two) times daily with a meal. 1/2 TAB IN AM    . Misc Natural Products (OSTEO BI-FLEX ADV JOINT SHIELD PO) Take 1 capsule by mouth daily.    . Multiple Vitamin (MULTIVITAMIN WITH MINERALS) TABS tablet Take 1 tablet by mouth daily.    . Omega-3 Fatty Acids (FISH OIL) 1000 MG CAPS Take 1,000 mg by mouth 2 (two) times daily with a meal.    . Polyethyl Glycol-Propyl Glycol (SYSTANE ULTRA OP) Place 1 drop into both eyes daily.    Vladimir Faster Glycol-Propyl Glycol (SYSTANE) 0.4-0.3 % GEL ophthalmic gel Place 1 application into both eyes at bedtime.    . primidone (MYSOLINE) 50 MG tablet TAKE 1/2 TABLET BY MOUTH AT BEDTIME. 15 tablet 6  . rOPINIRole (REQUIP) 1 MG tablet Take 0.5 mg by mouth every evening. 3 hours before bedtime    . solifenacin (VESICARE) 10 MG tablet Take 10 mg by mouth daily.      No current facility-administered medications for this visit.    Allergies as of 01/16/2016 - Review Complete 01/16/2016  Allergen Reaction Noted  . Propranolol Itching and Rash 02/18/2015  . Sulfa antibiotics Rash 06/10/2011    Vitals: BP 149/72 mmHg  Pulse 74  Ht 5' (1.524 m)  Wt 104 lb 12.8 oz (47.537 kg)  BMI 20.47 kg/m2 Last Weight:  Wt Readings from Last 1 Encounters:  01/16/16 104 lb 12.8 oz (47.537 kg)   Last Height:   Ht Readings from Last 1 Encounters:  01/16/16 5' (1.524 m)    Exam: Gen: NAD  CV: RRR, no MRG.  Eyes: Conjunctivae clear without exudates or hemorrhage  Neuro: Detailed Neurologic  Exam  Speech:  Speech is normal; fluent and spontaneous with normal comprehension.  Cognition:  The patient is oriented to person, place, and time;   Cranial Nerves:  The pupils are equal, round, and reactive to light. Visual fields are full to finger confrontation. Extraocular movements are intact. Trigeminal sensation is intact and the muscles of mastication are normal. The face is symmetric. The palate elevates in the midline. Hearing intact. Voice is normal. Shoulder shrug is normal. The tongue has normal motion without fasciculations.   Coordination:  Normal finger to nose and heel to shin. Normal rapid alternating movements.   Gait:  Mildly stooped, turns slowly, narrow but not shuffling.  Motor Observation:  No resting tremor. Bilateral upper extremity postural and action tremor in the hands. High-frequency low amplitude. Chin tremor.  Tone:  Normal muscle tone.   Strength:  Strength is V/V in the upper and lower limbs.    Sensation: Decreased pinprick and temperature distally    Assessment/Plan: [age] year old woman presenting for follow up evaluation of history of bilateral hand tremor and muscle cramping. Based on clinical history and physical exam her findings are most consistent with a diagnosis of essential tremor. Today she is also complaining of stiffness in the feet.  - Stiffness in the feet likely arthritic as opposed to worsening of neuropathy. - Essential tremor: propranolol with rash. We'll slowly increase primidone - continue Requip 1mg  qhs for RLS -check B12 level : normal - always use walking aid even in the house, fall precautions - Memory: monitor clinically - insomnia: good sleep hygiene - Neuropathy: ContinueNeurontin dose which may also be helpful for essential tremor.  She describes a lot of stiffness in the legs and feet which seems more arthritic to me as opposed to worsening of neuropathy. She is seeing Dr.  Alphonsus Sias on the 6th. There was no changes in her sensory exam today.    Sarina Ill, MD  [facility] [address] [place], [place] [address]  Phone [phone] Fax [phone]  A total of 30 minutes was spent face-to-face with this patient. Over half this time was spent on counseling patient on the trmor diagnosis and different diagnostic and therapeutic options available.

## 2016-01-21 ENCOUNTER — Other Ambulatory Visit (HOSPITAL_BASED_OUTPATIENT_CLINIC_OR_DEPARTMENT_OTHER): Payer: PPO

## 2016-01-21 ENCOUNTER — Encounter (HOSPITAL_COMMUNITY): Payer: Self-pay

## 2016-01-21 ENCOUNTER — Ambulatory Visit (HOSPITAL_COMMUNITY)
Admission: RE | Admit: 2016-01-21 | Discharge: 2016-01-21 | Disposition: A | Discharge status: Home / Self Care | Payer: PPO | Source: Ambulatory Visit | Attending: Oncology | Admitting: Oncology

## 2016-01-21 DIAGNOSIS — I2510 Atherosclerotic heart disease of native coronary artery without angina pectoris: Secondary | ICD-10-CM | POA: Diagnosis not present

## 2016-01-21 DIAGNOSIS — C772 Secondary and unspecified malignant neoplasm of intra-abdominal lymph nodes: Secondary | ICD-10-CM

## 2016-01-21 DIAGNOSIS — C7B09 Secondary carcinoid tumors of other sites: Secondary | ICD-10-CM

## 2016-01-21 DIAGNOSIS — J329 Chronic sinusitis, unspecified: Secondary | ICD-10-CM | POA: Insufficient documentation

## 2016-01-21 DIAGNOSIS — C801 Malignant (primary) neoplasm, unspecified: Secondary | ICD-10-CM | POA: Diagnosis not present

## 2016-01-21 DIAGNOSIS — R918 Other nonspecific abnormal finding of lung field: Secondary | ICD-10-CM | POA: Diagnosis not present

## 2016-01-21 LAB — CBC WITH DIFFERENTIAL/PLATELET
BASO%: 0.9 % (ref 0.0–2.0)
BASOS ABS: 0 10*3/uL (ref 0.0–0.1)
EOS%: 2.9 % (ref 0.0–7.0)
Eosinophils Absolute: 0.1 10*3/uL (ref 0.0–0.5)
HEMATOCRIT: 40.3 % (ref 34.8–46.6)
HEMOGLOBIN: 13.1 g/dL (ref 11.6–15.9)
LYMPH#: 1.1 10*3/uL (ref 0.9–3.3)
LYMPH%: 27.6 % (ref 14.0–49.7)
MCH: 30.8 pg (ref 25.1–34.0)
MCHC: 32.5 g/dL (ref 31.5–36.0)
MCV: 94.9 fL (ref 79.5–101.0)
MONO#: 0.3 10*3/uL (ref 0.1–0.9)
MONO%: 7.7 % (ref 0.0–14.0)
NEUT%: 60.9 % (ref 38.4–76.8)
NEUTROS ABS: 2.4 10*3/uL (ref 1.5–6.5)
Platelets: 209 10*3/uL (ref 145–400)
RBC: 4.24 10*6/uL (ref 3.70–5.45)
RDW: 15.2 % — AB (ref 11.2–14.5)
WBC: 4 10*3/uL (ref 3.9–10.3)

## 2016-01-21 LAB — COMPREHENSIVE METABOLIC PANEL
ALBUMIN: 3.8 g/dL (ref 3.5–5.0)
ALK PHOS: 95 U/L (ref 40–150)
ALT: 15 U/L (ref 0–55)
AST: 23 U/L (ref 5–34)
Anion Gap: 8 mEq/L (ref 3–11)
BILIRUBIN TOTAL: 0.42 mg/dL (ref 0.20–1.20)
BUN: 15.4 mg/dL (ref 7.0–26.0)
CALCIUM: 9.8 mg/dL (ref 8.4–10.4)
CO2: 28 mEq/L (ref 22–29)
CREATININE: 0.7 mg/dL (ref 0.6–1.1)
Chloride: 99 mEq/L (ref 98–109)
EGFR: 80 mL/min/{1.73_m2} — ABNORMAL LOW (ref >90)
GLUCOSE: 86 mg/dL (ref 70–140)
POTASSIUM: 4.2 meq/L (ref 3.5–5.1)
Sodium: 135 mEq/L — ABNORMAL LOW (ref 136–145)
TOTAL PROTEIN: 7.1 g/dL (ref 6.4–8.3)

## 2016-01-21 LAB — GLUCOSE, CAPILLARY: GLUCOSE-CAPILLARY: 75 mg/dL (ref 65–99)

## 2016-01-21 MED ORDER — FLUDEOXYGLUCOSE F - 18 (FDG) INJECTION
5.4000 | Freq: Once | INTRAVENOUS | Status: AC | PRN
  Administered 2016-01-21: 5.4 via INTRAVENOUS

## 2016-01-22 ENCOUNTER — Other Ambulatory Visit: Payer: Self-pay

## 2016-01-24 ENCOUNTER — Ambulatory Visit: Payer: Self-pay | Admitting: Oncology

## 2016-01-25 ENCOUNTER — Telehealth: Payer: Self-pay | Admitting: Oncology

## 2016-01-25 ENCOUNTER — Ambulatory Visit (HOSPITAL_BASED_OUTPATIENT_CLINIC_OR_DEPARTMENT_OTHER): Payer: PPO | Admitting: Oncology

## 2016-01-25 VITALS — BP 141/56 | HR 73 | Temp 98.0°F | Resp 17 | Ht 60.0 in | Wt 105.3 lb

## 2016-01-25 DIAGNOSIS — C772 Secondary and unspecified malignant neoplasm of intra-abdominal lymph nodes: Secondary | ICD-10-CM | POA: Diagnosis not present

## 2016-01-25 DIAGNOSIS — C801 Malignant (primary) neoplasm, unspecified: Secondary | ICD-10-CM

## 2016-01-25 DIAGNOSIS — R590 Localized enlarged lymph nodes: Secondary | ICD-10-CM

## 2016-01-25 NOTE — Progress Notes (Signed)
Hematology and Oncology Follow Up Visit  Dana Klein [id number] [birth year] 82 y.o. 01/25/2016 2:24 PM Klein,Dana, MDRamachandran, Dana Kaufmann, MD   Principle Diagnosis: 82 year old woman with metastatic carcinoma of unknown primary. She presented with retroperitoneal adenopathy largest measuring 2.8 cm diagnosed in December 2016 after a CT-guided biopsy showed a poorly differentiated carcinoma. The primary malignancy has not been identified with a pathology indicating possibly pancreatic biliary etiology.   Current therapy: Observation and surveillance.  Interim History: Dana Klein presents today for a follow-up visit. Since the last visit, she reports no major changes in her health. She is still ambulatory with the help of a cane without any falls or syncope. She does have tremors and have been seen by neurology regarding this issue. She also had stiffness in her lower extremities but not interfering with function. Despite her mobility, she is quite frail but have not reported any major changes in her health since the last visit. She denied any abdominal pain, distention or weight loss. Her appetite remains reasonable. She denied any GI or GU bleeding.  She does not report any headaches, blurry vision, or seizures. She does not report any fevers, chills or sweats. Does not report any cough, wheezing, hemoptysis or dyspnea on exertion. Does not report any chest pain, palpitation, orthopnea or leg edema. Does not report any nausea, vomiting or abdominal pain. Does not report any constipation, diarrhea, hematochezia or melena. She does not report any frequency urgency or hesitancy. She does not report any skeletal complaints. Remaining review of systems unremarkable.   Medications: I have reviewed the patient's current medications.  Current Outpatient Prescriptions  Medication Sig Dispense Refill  . acetaminophen (TYLENOL) 500 MG tablet Take 500 mg by mouth 2 (two) times daily with a meal.    .  aspirin EC 81 MG tablet Take 81 mg by mouth 2 (two) times a week.    . Calcium Carbonate-Vitamin D (CALCIUM-D PO) Take 1 tablet by mouth 2 (two) times daily with a meal. Triple Strength    . docusate sodium (COLACE) 100 MG capsule Take 100 mg by mouth 2 (two) times daily with a meal. As needed for constipation    . DULoxetine (CYMBALTA) 30 MG capsule Take 30 mg by mouth daily.  12  . gabapentin (NEURONTIN) 300 MG capsule Take one capsule in the morning, one in the afternoon and 2 in the evening 120 capsule 11  . levothyroxine (SYNTHROID, LEVOTHROID) 50 MCG tablet Take 50 mcg by mouth daily before breakfast.    . magnesium oxide (MAG-OX) 400 MG tablet Take 400 mg by mouth at bedtime.     . meloxicam (MOBIC) 15 MG tablet Take 7.5 mg by mouth 2 (two) times daily with a meal. 1/2 TAB IN AM    . Misc Natural Products (OSTEO BI-FLEX ADV JOINT SHIELD PO) Take 1 capsule by mouth daily.    . Multiple Vitamin (MULTIVITAMIN WITH MINERALS) TABS tablet Take 1 tablet by mouth daily.    . Omega-3 Fatty Acids (FISH OIL) 1000 MG CAPS Take 1,000 mg by mouth 2 (two) times daily with a meal.    . Polyethyl Glycol-Propyl Glycol (SYSTANE ULTRA OP) Place 1 drop into both eyes daily.    Dana Klein Glycol-Propyl Glycol (SYSTANE) 0.4-0.3 % GEL ophthalmic gel Place 1 application into both eyes at bedtime.    . primidone (MYSOLINE) 50 MG tablet Take 2 tablets (100 mg total) by mouth at bedtime. 60 tablet 12  . rOPINIRole (REQUIP) 1 MG tablet Take 0.5  mg by mouth every evening. 3 hours before bedtime    . solifenacin (VESICARE) 10 MG tablet Take 10 mg by mouth daily.      No current facility-administered medications for this visit.     Allergies:  Allergies  Allergen Reactions  . Propranolol Itching and Rash    Rash All over   . Sulfa Antibiotics Rash    Past Medical History, Surgical history, Social history, and Family History were reviewed and updated.   Physical Exam: Blood pressure 141/56, pulse 73,  temperature 98 F (36.7 C), temperature source Oral, resp. rate 17, height 5' (1.524 m), weight 105 lb 4.8 oz (47.764 kg), SpO2 98 %. ECOG: 2 General appearance: alert and cooperative Head: Normocephalic, without obvious abnormality Neck: no adenopathy Lymph nodes: Cervical, supraclavicular, and axillary nodes normal. Heart:regular rate and rhythm, S1, S2 normal, no murmur, click, rub or gallop Lung:chest clear, no wheezing, rales, normal symmetric air entry Abdomin: soft, non-tender, without masses or organomegaly EXT:no erythema, induration, or nodules   Lab Results: Lab Results  Component Value Date   WBC 4.0 01/21/2016   HGB 13.1 01/21/2016   HCT 40.3 01/21/2016   MCV 94.9 01/21/2016   PLT 209 01/21/2016     Chemistry      Component Value Date/Time   NA 135* 01/21/2016 1049   NA 131* 02/02/2015 1741   K 4.2 01/21/2016 1049   K 4.6 02/02/2015 1741   CL 99 02/02/2015 1741   CO2 28 01/21/2016 1049   CO2 26 05/02/2009 0550   BUN 15.4 01/21/2016 1049   BUN 15 10/22/2015 1800   CREATININE 0.7 01/21/2016 1049   CREATININE 0.47 10/22/2015 1800      Component Value Date/Time   CALCIUM 9.8 01/21/2016 1049   CALCIUM 8.2* 05/02/2009 0550   ALKPHOS 95 01/21/2016 1049   ALKPHOS 54 04/27/2009 1340   AST 23 01/21/2016 1049   AST 18 04/27/2009 1340   ALT 15 01/21/2016 1049   ALT 15 04/27/2009 1340   BILITOT 0.42 01/21/2016 1049   BILITOT 0.5 04/27/2009 1340       Radiological Studies:  EXAM: NUCLEAR MEDICINE PET SKULL BASE TO THIGH  TECHNIQUE: 4.6 mCi F-18 FDG was injected intravenously. Full-ring PET imaging was performed from the skull base to thigh after the radiotracer. CT data was obtained and used for attenuation correction and anatomic localization.  FASTING BLOOD GLUCOSE: Value: 75 mg/dl  COMPARISON: 10/24/2015 chest CT. 10/22/2015 CT abdomen/pelvis.  FINDINGS: NECK  There is a hypermetabolic 0.9 cm left level 4 neck lymph node (series 4/  image 46) with max SUV 6.0. No additional hypermetabolic lymph nodes in the neck.  There is near complete opacification of the bilateral maxillary sinuses with partial opacification of the anterior right ethmoidal air cells, increased bilaterally from 02/18/2015 brain MRI.  CHEST  No hypermetabolic axillary, mediastinal or hilar nodes. Mild pericardial fluid/thickening. Coronary atherosclerosis. There is a tubular branching opacity in the medial right upper lobe (series 6/ images 9-13) without appreciable metabolism, not appreciably changed in size or appearance since 10/24/2015, most suggestive of mucoid impaction within dilated peripheral airways. Stable thin-walled air cyst in the medial right lower lobe, probably a pneumatocele. No acute consolidative airspace disease, significant pulmonary nodules or lung masses.  ABDOMEN/PELVIS  There is a hypermetabolic 1.6 cm retrocaval lymph nodes below the level of the renal veins (series 4/image 112) with max SUV 7.8. There is a hypermetabolic 1.1 cm aortocaval node (series 4/image 125) with max SUV 7.0. There  is a hypermetabolic 1.1 cm left para-aortic node (series 4/image 118) with max SUV 5.9. No additional hypermetabolic abdominopelvic lymph nodes.  No abnormal hypermetabolic activity within the liver, pancreas, adrenal glands, or spleen. Atherosclerotic nonaneurysmal abdominal aorta. No abnormal focal hypermetabolism within the bowel. Moderate to large volume stool throughout the colon, suggesting constipation. Hysterectomy.  SKELETON  No focal hypermetabolic activity to suggest skeletal metastasis. Bilateral posterior lower lumbar spine fusion.  IMPRESSION: 1. Hypermetabolic retroperitoneal and left supraclavicular (Virchow node) nodal metastases. 2. No hypermetabolic primary site of malignancy is evident. 3. Stable non-metabolic branching tubular opacity in the medial right upper lobe, most suggestive of mucoid  impaction within bronchiectatic peripheral airways. 4. Progressive chronic bilateral paranasal sinusitis. 5. Coronary atherosclerosis. Mild pericardial fluid/thickening.     Impression and Plan:  82 year old woman with the following issues:  1. Metastatic carcinoma of unknown primary. It is unclear if this is a metastatic from a GI, GU or pancreatic biliary tract. Her set of disease include retroperitoneal adenopathy with the largest of which around 2.8 cm.   PET CT scan obtained on 01/21/2016 was reviewed today which showed hypermetabolic lymphadenopathy with the largest lymph node measuring about 1.6 cm. There are questionable left supraclavicular lymph nodes as well. No solid tumor malignancy have been identified.  These findings were discussed with the patient and her family extensively. It still remains unclear the primary etiology of her carcinoma. These findings indicate no curative options and only palliative. Given her old age, frail status and being completely asymptomatic I have deferred the option of chemotherapy at this time. I do not think she would be a great candidate for it and likely will worsen her quality of life at this time.  I have elected to continue with observation and surveillance with a clinical visit in 3 months and likely repeat imaging studies in 6 months.  She understands if she develops progression of disease and rapidly which includes weight loss, fatigue, pain  these would be sign of an incurable progressing cancer.  2. Follow-up: Will be in 3 months for clinical visit.  Zola Button, MD 3/10/20172:24 PM

## 2016-01-25 NOTE — Telephone Encounter (Signed)
Gave and printed appt shced and avs for pt for June °

## 2016-01-29 ENCOUNTER — Telehealth: Payer: Self-pay | Admitting: Neurology

## 2016-01-29 NOTE — Telephone Encounter (Signed)
Patient called to give Dr Jaynee Eagles update on her condition. She said on 01/21/16 she had a PET scan done. Last Friday 01/25/16 she was told she does not have cancer in the abdominal organs, still has cancer in the lymph nodes. She said the oncologist wants to wait 3 months to re-evaluate her. She is so happy!!

## 2016-01-30 NOTE — Telephone Encounter (Signed)
Spoke with patient, that is wonderful news.

## 2016-02-06 DIAGNOSIS — Z0000 Encounter for general adult medical examination without abnormal findings: Secondary | ICD-10-CM | POA: Diagnosis not present

## 2016-02-06 DIAGNOSIS — E039 Hypothyroidism, unspecified: Secondary | ICD-10-CM | POA: Diagnosis not present

## 2016-02-06 DIAGNOSIS — E7800 Pure hypercholesterolemia, unspecified: Secondary | ICD-10-CM | POA: Diagnosis not present

## 2016-02-06 DIAGNOSIS — C799 Secondary malignant neoplasm of unspecified site: Secondary | ICD-10-CM | POA: Diagnosis not present

## 2016-02-06 DIAGNOSIS — I10 Essential (primary) hypertension: Secondary | ICD-10-CM | POA: Diagnosis not present

## 2016-02-06 DIAGNOSIS — E559 Vitamin D deficiency, unspecified: Secondary | ICD-10-CM | POA: Diagnosis not present

## 2016-02-06 DIAGNOSIS — G629 Polyneuropathy, unspecified: Secondary | ICD-10-CM | POA: Diagnosis not present

## 2016-02-13 DIAGNOSIS — E039 Hypothyroidism, unspecified: Secondary | ICD-10-CM | POA: Diagnosis not present

## 2016-02-13 DIAGNOSIS — C799 Secondary malignant neoplasm of unspecified site: Secondary | ICD-10-CM | POA: Diagnosis not present

## 2016-02-13 DIAGNOSIS — E7800 Pure hypercholesterolemia, unspecified: Secondary | ICD-10-CM | POA: Diagnosis not present

## 2016-02-13 DIAGNOSIS — I10 Essential (primary) hypertension: Secondary | ICD-10-CM | POA: Diagnosis not present

## 2016-02-19 ENCOUNTER — Encounter: Payer: Self-pay | Admitting: Podiatry

## 2016-02-19 ENCOUNTER — Ambulatory Visit (INDEPENDENT_AMBULATORY_CARE_PROVIDER_SITE_OTHER): Payer: PPO | Admitting: Podiatry

## 2016-02-19 DIAGNOSIS — M79676 Pain in unspecified toe(s): Secondary | ICD-10-CM | POA: Diagnosis not present

## 2016-02-19 DIAGNOSIS — B351 Tinea unguium: Secondary | ICD-10-CM | POA: Diagnosis not present

## 2016-02-19 DIAGNOSIS — G629 Polyneuropathy, unspecified: Secondary | ICD-10-CM

## 2016-02-19 NOTE — Progress Notes (Signed)
Patient ID: SAMYA ANGELL, female   DOB: 05/26/1928, 82 y.o.   MRN: [id number] Complaint:  Visit Type: Patient returns to my office for continued preventative foot care services. Complaint: Patient states" my nails have grown long and thick and become painful to walk and wear shoes..She presents for preventative foot care services. No changes to ROS. She says she has been diagnosed with essentail trermors and neuropathy.  Vascular: dorsalis pedis and posterior tibial pulses are palpable bilateral. Capillary return is immediate. Temperature gradient is WNL. Skin turgor WNL  Sensorium: Normal Semmes Weinstein monofilament test. Normal tactile sensation bilaterally. Nail Exam: Pt has thick disfigured discolored nails with subungual debris noted bilateral entire nail hallux through fifth toenails Ulcer Exam: There is no evidence of ulcer or pre-ulcerative changes or infection. Orthopedic Exam: Muscle tone and strength are WNL. No limitations in general ROM. No crepitus or effusions noted. Foot type and digits show no abnormalities. Bony prominences are unremarkable. Skin: No Porokeratosis. No infection or ulcers  Diagnosis:  Tinea unguium, Pain in right toe, pain in left toes  Treatment & Plan Procedures and Treatment: Consent by patient was obtained for treatment procedures. The patient understood the discussion of treatment and procedures well. All questions were answered thoroughly reviewed. Debridement of mycotic and hypertrophic toenails, 1 through 5 bilateral and clearing of subungual debris. No ulceration, no infection noted.  Return Visit-Office Procedure: Patient instructed to return to the office for a follow up visit 3 months for continued evaluation and treatment.   Gardiner Barefoot DPM

## 2016-04-04 ENCOUNTER — Telehealth: Payer: Self-pay | Admitting: Oncology

## 2016-04-04 DIAGNOSIS — Z01419 Encounter for gynecological examination (general) (routine) without abnormal findings: Secondary | ICD-10-CM | POA: Diagnosis not present

## 2016-04-04 DIAGNOSIS — Z1231 Encounter for screening mammogram for malignant neoplasm of breast: Secondary | ICD-10-CM | POA: Diagnosis not present

## 2016-04-04 DIAGNOSIS — Z6821 Body mass index (BMI) 21.0-21.9, adult: Secondary | ICD-10-CM | POA: Diagnosis not present

## 2016-04-04 NOTE — Telephone Encounter (Signed)
S/w pt, advised aptp chg from 6/2 to 6/22 @ 3pm due to md pal. Pt verbalized understanding.

## 2016-04-18 ENCOUNTER — Other Ambulatory Visit: Payer: PPO

## 2016-04-18 ENCOUNTER — Ambulatory Visit: Payer: PPO | Admitting: Oncology

## 2016-05-08 ENCOUNTER — Other Ambulatory Visit (HOSPITAL_BASED_OUTPATIENT_CLINIC_OR_DEPARTMENT_OTHER): Payer: PPO

## 2016-05-08 ENCOUNTER — Ambulatory Visit (HOSPITAL_BASED_OUTPATIENT_CLINIC_OR_DEPARTMENT_OTHER): Payer: PPO | Admitting: Oncology

## 2016-05-08 ENCOUNTER — Telehealth: Payer: Self-pay | Admitting: Oncology

## 2016-05-08 VITALS — BP 162/59 | HR 59 | Temp 98.1°F | Resp 17 | Ht 60.0 in | Wt 104.9 lb

## 2016-05-08 DIAGNOSIS — C801 Malignant (primary) neoplasm, unspecified: Secondary | ICD-10-CM

## 2016-05-08 DIAGNOSIS — R590 Localized enlarged lymph nodes: Secondary | ICD-10-CM | POA: Diagnosis not present

## 2016-05-08 LAB — COMPREHENSIVE METABOLIC PANEL
ALBUMIN: 3.6 g/dL (ref 3.5–5.0)
ALK PHOS: 71 U/L (ref 40–150)
ALT: 20 U/L (ref 0–55)
AST: 24 U/L (ref 5–34)
Anion Gap: 5 mEq/L (ref 3–11)
BILIRUBIN TOTAL: 0.3 mg/dL (ref 0.20–1.20)
BUN: 17.9 mg/dL (ref 7.0–26.0)
CALCIUM: 9.9 mg/dL (ref 8.4–10.4)
CO2: 28 mEq/L (ref 22–29)
CREATININE: 0.7 mg/dL (ref 0.6–1.1)
Chloride: 103 mEq/L (ref 98–109)
EGFR: 77 mL/min/{1.73_m2} — ABNORMAL LOW (ref >90)
GLUCOSE: 93 mg/dL (ref 70–140)
POTASSIUM: 4.5 meq/L (ref 3.5–5.1)
Sodium: 136 mEq/L (ref 136–145)
TOTAL PROTEIN: 6.5 g/dL (ref 6.4–8.3)

## 2016-05-08 LAB — CBC WITH DIFFERENTIAL/PLATELET
BASO%: 0.8 % (ref 0.0–2.0)
BASOS ABS: 0 10*3/uL (ref 0.0–0.1)
EOS ABS: 0.1 10*3/uL (ref 0.0–0.5)
EOS%: 2.7 % (ref 0.0–7.0)
HEMATOCRIT: 36.5 % (ref 34.8–46.6)
HEMOGLOBIN: 12.1 g/dL (ref 11.6–15.9)
LYMPH#: 1.3 10*3/uL (ref 0.9–3.3)
LYMPH%: 33.5 % (ref 14.0–49.7)
MCH: 32.5 pg (ref 25.1–34.0)
MCHC: 33.2 g/dL (ref 31.5–36.0)
MCV: 97.9 fL (ref 79.5–101.0)
MONO#: 0.2 10*3/uL (ref 0.1–0.9)
MONO%: 6.1 % (ref 0.0–14.0)
NEUT%: 56.9 % (ref 38.4–76.8)
NEUTROS ABS: 2.3 10*3/uL (ref 1.5–6.5)
Platelets: 191 10*3/uL (ref 145–400)
RBC: 3.73 10*6/uL (ref 3.70–5.45)
RDW: 14.1 % (ref 11.2–14.5)
WBC: 4 10*3/uL (ref 3.9–10.3)

## 2016-05-08 NOTE — Progress Notes (Signed)
Hematology and Oncology Follow Up Visit  Dana Klein [id number] 11-27-1927 82 y.o. 05/08/2016 3:22 PM Dana Klein, MDRamachandran, Dana Kaufmann, MD   Principle Diagnosis: 82 year old woman with metastatic carcinoma of unknown primary. She presented with retroperitoneal adenopathy largest measuring 2.8 cm diagnosed in December 2016 after a CT-guided biopsy showed a poorly differentiated carcinoma. The primary malignancy has not been identified with a pathology indicating possibly pancreatic biliary etiology.   Current therapy: Observation and surveillance.  Interim History: Dana Klein presents today for a follow-up visit. Since the last visit, she reports no complaints. She denied any specific symptoms except for occasional bloating. She still able to eat and maintain her weight at this time. She is still ambulatory with the help of a cane without any falls or syncope. SShe denied any abdominal pain, distention or weight loss. Her appetite remains reasonable. She denied any GI or GU bleeding.  She does not report any headaches, blurry vision, or seizures. She does not report any fevers, chills or sweats. Does not report any cough, wheezing, hemoptysis or dyspnea on exertion. Does not report any chest pain, palpitation, orthopnea or leg edema. Does not report any nausea, vomiting or abdominal pain. Does not report any constipation, diarrhea, hematochezia or melena. She does not report any frequency urgency or hesitancy. She does not report any skeletal complaints. Remaining review of systems unremarkable.   Medications: I have reviewed the patient's current medications.  Current Outpatient Prescriptions  Medication Sig Dispense Refill  . acetaminophen (TYLENOL) 500 MG tablet Take 500 mg by mouth 2 (two) times daily with a meal.    . aspirin EC 81 MG tablet Take 81 mg by mouth 2 (two) times a week.    . Calcium Carbonate-Vitamin D (CALCIUM-D PO) Take 1 tablet by mouth 2 (two) times daily with a  meal. Triple Strength    . docusate sodium (COLACE) 100 MG capsule Take 100 mg by mouth 2 (two) times daily with a meal. As needed for constipation    . DULoxetine (CYMBALTA) 30 MG capsule Take 30 mg by mouth daily.  12  . gabapentin (NEURONTIN) 300 MG capsule Take one capsule in the morning, one in the afternoon and 2 in the evening 120 capsule 11  . levothyroxine (SYNTHROID, LEVOTHROID) 50 MCG tablet Take 50 mcg by mouth daily before breakfast.    . magnesium oxide (MAG-OX) 400 MG tablet Take 400 mg by mouth at bedtime.     . meloxicam (MOBIC) 15 MG tablet Take 7.5 mg by mouth 2 (two) times daily with a meal. 1/2 TAB IN AM    . Misc Natural Products (OSTEO BI-FLEX ADV JOINT SHIELD PO) Take 1 capsule by mouth daily.    . Multiple Vitamin (MULTIVITAMIN WITH MINERALS) TABS tablet Take 1 tablet by mouth daily.    . Omega-3 Fatty Acids (FISH OIL) 1000 MG CAPS Take 1,000 mg by mouth 2 (two) times daily with a meal.    . Polyethyl Klein-Propyl Klein (SYSTANE ULTRA OP) Place 1 drop into both eyes daily.    Dana Klein (SYSTANE) 0.4-0.3 % GEL ophthalmic gel Place 1 application into both eyes at bedtime.    . primidone (MYSOLINE) 50 MG tablet Take 2 tablets (100 mg total) by mouth at bedtime. 60 tablet 12  . rOPINIRole (REQUIP) 1 MG tablet Take 0.5 mg by mouth every evening. 3 hours before bedtime    . solifenacin (VESICARE) 10 MG tablet Take 10 mg by mouth daily.      No  current facility-administered medications for this visit.     Allergies:  Allergies  Allergen Reactions  . Propranolol Itching and Rash    Rash All over   . Sulfa Antibiotics Rash    Past Medical History, Surgical history, Social history, and Family History were reviewed and updated.   Physical Exam: Blood pressure 162/59, pulse 59, temperature 98.1 F (36.7 C), temperature source Oral, resp. rate 17, height 5' (1.524 m), weight 104 lb 14.4 oz (47.582 kg), SpO2 100 %. ECOG: 2 General appearance:  alert and cooperative appeared without distress. Head: Normocephalic, without obvious abnormality no oral ulcers or lesions. Neck: no adenopathy Lymph nodes: Cervical, supraclavicular, and axillary nodes normal. Heart:regular rate and rhythm, S1, S2 normal, no murmur, click, rub or gallop Lung:chest clear, no wheezing, rales, normal symmetric air entry Abdomin: soft, non-tender, without masses or organomegaly no rebound or guarding. EXT:no erythema, induration, or nodules   Lab Results: Lab Results  Component Value Date   WBC 4.0 05/08/2016   HGB 12.1 05/08/2016   HCT 36.5 05/08/2016   MCV 97.9 05/08/2016   PLT 191 05/08/2016     Chemistry      Component Value Date/Time   NA 135* 01/21/2016 1049   NA 131* 02/02/2015 1741   K 4.2 01/21/2016 1049   K 4.6 02/02/2015 1741   CL 99 02/02/2015 1741   CO2 28 01/21/2016 1049   CO2 26 05/02/2009 0550   BUN 15.4 01/21/2016 1049   BUN 15 10/22/2015 1800   CREATININE 0.7 01/21/2016 1049   CREATININE 0.47 10/22/2015 1800      Component Value Date/Time   CALCIUM 9.8 01/21/2016 1049   CALCIUM 8.2* 05/02/2009 0550   ALKPHOS 95 01/21/2016 1049   ALKPHOS 54 04/27/2009 1340   AST 23 01/21/2016 1049   AST 18 04/27/2009 1340   ALT 15 01/21/2016 1049   ALT 15 04/27/2009 1340   BILITOT 0.42 01/21/2016 1049   BILITOT 0.5 04/27/2009 1340       Impression and Plan:  82 year old woman with the following issues:  1. Metastatic carcinoma of unknown primary. It is unclear if this is a metastatic from a GI, GU or pancreatic biliary tract. Her set of disease include retroperitoneal adenopathy with the largest of which around 2.8 cm.   PET CT scan obtained on 01/21/2016 showed hypermetabolic lymphadenopathy with the largest lymph node measuring about 1.6 cm. There are questionable left supraclavicular lymph nodes as well. No solid tumor malignancy have been identified.  These findings were discussed with the patient and her family again today.  It still remains unclear the primary etiology of her carcinoma. These findings indicate no curative options and only palliative. Systemic chemotherapy options were reviewed as well and for the time being she continue to defer. She is asymptomatic at this time and does not have any clinical deterioration. The plan is to continue with clinical surveillance and supportive care only. We will repeat imaging studies as needed.  2. Follow-up: Will be in 3 months for clinical visit.  [facility], MD 6/22/20173:22 PM

## 2016-05-08 NOTE — Telephone Encounter (Signed)
Gave and printed appt sched and avs for pt for Sept °

## 2016-05-14 ENCOUNTER — Ambulatory Visit (INDEPENDENT_AMBULATORY_CARE_PROVIDER_SITE_OTHER): Payer: PPO | Admitting: Neurology

## 2016-05-14 ENCOUNTER — Encounter: Payer: Self-pay | Admitting: Neurology

## 2016-05-14 VITALS — BP 142/62 | HR 63 | Ht 60.0 in | Wt 107.2 lb

## 2016-05-14 DIAGNOSIS — G250 Essential tremor: Secondary | ICD-10-CM | POA: Diagnosis not present

## 2016-05-14 DIAGNOSIS — W19XXXD Unspecified fall, subsequent encounter: Secondary | ICD-10-CM

## 2016-05-14 DIAGNOSIS — R2689 Other abnormalities of gait and mobility: Secondary | ICD-10-CM

## 2016-05-14 MED ORDER — GABAPENTIN 300 MG PO CAPS: 600.0000 mg | ORAL_CAPSULE | Freq: Three times a day (TID) | ORAL | Status: DC

## 2016-05-14 MED ORDER — PRIMIDONE 50 MG PO TABS: 200.0000 mg | ORAL_TABLET | Freq: Every day | ORAL | Status: DC

## 2016-05-14 NOTE — Progress Notes (Signed)
[id number]    Provider:  Dr Dana Klein Referring Provider: [name], MD Primary Care Physician:  Dana Seashore, MD  CC: tremor an dimbalance  Interval history 05/15/2016: 82 year old patient here for tremor. She is on 2 tabs of primidone at night. She is beginning to drool. Would increase to 3 or 4 tablets at night.She is having balance problems, I recommend physical therapy. She is moving to [employer]. When she stands up she often has to step back or side step. She is moving into a house out there close to where she was born, close to sister and daughter. Would go up on the primidone by one pill at night. Can also increase her gabapentin as this is also a medication that may help with essential tremor.   Interval history 01/16/2016: Sheis here for follow up on Tremor is worsening and now involving her chin worse. She is only on 1/2 pill of 50mg  Primidone. Will slowly increase as tolerated. Patient can increase to a pill at night for 1-2 weeks and then go up by 1/2 a pill every few weeks as tolerated, just be careful for sedation ans side effects and risk of falls especially if she gets up in the middle of the night. She is going through testing for possible cancer, she is going to have a PET scan soon. No more falls, not getting on the roof or cleaning anymore. She is still cleaning up leaves and getting in the year and taking care of her house.She has some difficulty relaxing her legs. But her tone feels good without spasticity or anything that appears pathologic. She has some stiffness in the legs and ankles and joints due to osteoarthritis. Advised if she is in the kitchen make sure she has the spongy matts to help instead of standing on something hard, just ensure they are not slippery and well attached to the floor. She has some imbalance, advised her to use her cane and walker all the time. Also get up slowly. She has back pain and osteoarthritis as well contributing  to her imbalance. She wears her back brace all the time when doing activities.    Interval history 07/19/2015: She was evaluated by Dr. Rexene Klein in May who confirmed essential tremor. The tremor is still there. She is still taking the primidone at night. Her legs hurt, they are stiff and her feet hurt. She denies any sensory changes, no burning or tingling. They are very stiff. She has some redness and discoloration on her feet. She feels stiffness and swelling in the lower extremity. At the end of the day they are swollen. No cramping in the feet. Movement of the toes hurts, she keeps saying stiffness. Denies tingling, burning. It hurts all the time. They don't keep her awake. She is taking the neurontin twice daily. She is tolerating the neurontin really well. Will increase it to three times a day. One am, one afternoon and 2 pm. Discussed with patient that the symptoms in her feet are more likely due to osteoarthritis then neuropathy. I do recommend she follow up with her primary care. Can increase Neurontin to 3 times a day and see if that helps. We'll continue on current dose of primidone due to sedation risk, also Neurontin is good for essential tremor so increasing that may help as well.   Interval history 03/07/2015: Dana Klein is a 82 y.o. female here as a follow up. She fell down the stairs. Broke her arm. She was hospitalized. Reviewed  images of her brain that were done while she was in the hospital. She is taking 25mg  of the primidone. She will start taking the whole tablet.   MRI of the brain: personally reviewed images of the brain and agree with the following:  IMPRESSION: No acute brain infarction. The cerebellum does not show any focal insult.  Mild age related atrophy and chronic small vessel change, less than often seen in healthy individuals of this age.  Some inflammatory change of the maxillary sinuses. Small amount of fluid in the mastoid air cells left more than  right.  HPI: Dana Klein is a 82 y.o. female here as a follow up.  She worked out in the yard and is having pain in the anterior legs likely from overuse. She is a little slow, she hasn't been walking outside as much. Now that the weather is warm she can go back. Daughter denies hypophonia, changes in handwriting. She has hearing difficulty and usually talks too loud. Daughter feels expression is normal but family members have noticed the mild mouth tremor. Sinemet does not help her symptoms.   Emg/ncs 10/20: There is electrophysiologic evidence of a symmetric length-dependent severe axonal sensorimotor polyneuropathy. In addition, there is evidence of a moderately-severe right median nerve entrapment at the wrist. Decreased insertional activity of distal leg muscles could be due to atrophy. Clinical correlation suggested   Initial visit 08/29/2014: [name] patient who was previously seeing Dana Klein and is now following up with me. She was referred for tremor and was diagnosed with PD and is on Sinemet 3 times a day. Took her pill at 8:15 am so it is wearing off now. Tremor significantly improves with the medication. Her handwriting improves with the medication as well. Doesn't pay attention if she has any significant wearing off unless she is sitting reading but usually she is working outside and doesn't notice a tremor in fact was outside last night and just forgot to take her medication. No dyskinesias. Sometimes will fall over if she doesn't catch herself and "goes over". Using the cane when she goes out. At home doesn't use the cane. In the house she is fine, she can hold on and doesn't need it. Lives alone. Has a life alert necklace. Always wears it. Swallowing is fine, no choking. Sleeping well, uses requip for RLS. No rem sleep disorder. Not tired during the day.Notes some difficulty with short term memory, forgetting peoples names. Feels it is mild and not bothersome at this time  Had  back surgery in 2010 and experiencing some low back pain and tightness in her leg muscles below the knees. She had a fracture. Back hurts when she bends over. If she wears tennis shoes, feels like a vice around the feet. No bruning, no numbness, no tingling. Elevating the legs help.   Reviewed notes, labs and imaging from outside physicians, which showed: Initial visit 08/2013, tremor started a year previous both hands (unsure if started in one or other or both) and mostly at rest. Feels walking is slow, unsteady, trouble turning. Good sense of smell.   Has insomnia, is stable. Requip and Neurontin for RLS helps her sleep. Recent labs all unremarkable: cbc, crp, cmp, b12  Review of Systems: Patient complains of symptoms per HPI as well as the following symptoms: Eye itching, hearing loss, constipation, incontinence of bladder, bruising, numbness, tremors, aching muscles. Pertinent negatives per HPI. All others negative.   Social History   Social History  . Marital Status: Widowed  Spouse Name: N/A  . Number of Children: 1  . Years of Education: 12   Occupational History  .      Retired   Social History Main Topics  . Smoking status: Never Smoker   . Smokeless tobacco: Never Used  . Alcohol Use: No  . Drug Use: No  . Sexual Activity: Not on file   Other Topics Concern  . Not on file   Social History Narrative   Patient is a widow . Patient is retired and has a Dance movement psychotherapist.   Right handed.   Caffeine- One cup daily coffee.    Family History  Problem Relation Age of Onset  . Lung cancer Mother   . Colon cancer Mother   . Pneumonia Father   . Heart defect Father   . Diabetes Sister   . Diabetes Brother   . Diabetes Sister   . Diabetes Brother     Past Medical History  Diagnosis Date  . Back pain   . Osteoarthritis   . Hearing loss   . Bladder incontinence   . Leg numbness   . Numbness of foot   . Thyroid disease     Past Surgical History   Procedure Laterality Date  . Left wrist surgery    . Carpal tunnel release    . Back surgery    . Abdominal hysterectomy      Current Outpatient Prescriptions  Medication Sig Dispense Refill  . acetaminophen (TYLENOL) 500 MG tablet Take 500 mg by mouth 2 (two) times daily with a meal.    . aspirin EC 81 MG tablet Take 81 mg by mouth 2 (two) times a week.    . Calcium Carbonate-Vitamin D (CALCIUM-D PO) Take 1 tablet by mouth 2 (two) times daily with a meal. Triple Strength    . docusate sodium (COLACE) 100 MG capsule Take 100 mg by mouth 2 (two) times daily with a meal. As needed for constipation    . DULoxetine (CYMBALTA) 30 MG capsule Take 30 mg by mouth daily.  12  . gabapentin (NEURONTIN) 300 MG capsule Take one capsule in the morning, one in the afternoon and 2 in the evening 120 capsule 11  . levothyroxine (SYNTHROID, LEVOTHROID) 50 MCG tablet Take 50 mcg by mouth daily before breakfast.    . magnesium oxide (MAG-OX) 400 MG tablet Take 400 mg by mouth at bedtime.     . meloxicam (MOBIC) 15 MG tablet Take 7.5 mg by mouth 2 (two) times daily with a meal. 1/2 TAB IN AM    . Misc Natural Products (OSTEO BI-FLEX ADV JOINT SHIELD PO) Take 1 capsule by mouth daily.    . Multiple Vitamin (MULTIVITAMIN WITH MINERALS) TABS tablet Take 1 tablet by mouth daily.    . Omega-3 Fatty Acids (FISH OIL) 1000 MG CAPS Take 1,000 mg by mouth 2 (two) times daily with a meal.    . Polyethyl Glycol-Propyl Glycol (SYSTANE ULTRA OP) Place 1 drop into both eyes daily.    Vladimir Faster Glycol-Propyl Glycol (SYSTANE) 0.4-0.3 % GEL ophthalmic gel Place 1 application into both eyes at bedtime.    . primidone (MYSOLINE) 50 MG tablet Take 2 tablets (100 mg total) by mouth at bedtime. 60 tablet 12  . rOPINIRole (REQUIP) 1 MG tablet Take 0.5 mg by mouth every evening. 3 hours before bedtime    . solifenacin (VESICARE) 10 MG tablet Take 10 mg by mouth daily.      No current facility-administered  medications for this  visit.    Allergies as of 05/14/2016 - Review Complete 05/14/2016  Allergen Reaction Noted  . Propranolol Itching and Rash 02/18/2015  . Sulfa antibiotics Rash 06/10/2011    Vitals: BP 142/62 mmHg  Pulse 63  Ht 5' (1.524 m)  Wt 107 lb 3.2 oz (48.626 kg)  BMI 20.94 kg/m2 Last Weight:  Wt Readings from Last 1 Encounters:  05/14/16 107 lb 3.2 oz (48.626 kg)   Last Height:   Ht Readings from Last 1 Encounters:  05/14/16 5' (1.524 m)    Exam: Gen: NAD  CV: RRR, no MRG.  Eyes: Conjunctivae clear without exudates or hemorrhage  Neuro: Detailed Neurologic Exam  Speech:  Speech is normal; fluent and spontaneous with normal comprehension.  Cognition:  The patient is oriented to person, place, and time;   Cranial Nerves:  The pupils are equal, round, and reactive to light. Visual fields are full to finger confrontation. Extraocular movements are intact. Trigeminal sensation is intact and the muscles of mastication are normal. The face is symmetric. The palate elevates in the midline. Hearing intact. Voice is normal. Shoulder shrug is normal. The tongue has normal motion without fasciculations.   Coordination:  Normal finger to nose and heel to shin. Normal rapid alternating movements.   Gait:  Mildly stooped, turns slowly, narrow but not shuffling.  Motor Observation:  No resting tremor. Bilateral upper extremity postural and action tremor in the hands. High-frequency low amplitude. Chin tremor.  Tone:  Normal muscle tone.   Strength:  Strength is V/V in the upper and lower limbs.    Sensation: Decreased pinprick and temperature distally    Assessment/Plan: 12 82 year old woman presenting for follow up evaluation of history of bilateral hand tremor and muscle cramping. Based on clinical history and physical exam her findings are most consistent with a diagnosis of essential tremor. Today she is also complaining of  stiffness in the feet.  - Stiffness in the feet likely arthritic as opposed to worsening of neuropathy. - Essential tremor: propranolol with rash. We'll slowly increase primidone - continue Requip 1mg  qhs for RLS -check B12 level : normal - always use walking aid even in the house, fall precautions. Physical therapy for balance and safety - Memory: monitor clinically - insomnia: good sleep hygiene - Neuropathy: Continue Neurontin dose which may also be helpful for essential tremor.  As far as your medications are concerned, I would like to suggest: Increase Primidone to 150mg  at night (3 pills). Stay on this for 2 weeks to evaluate if it works. If needed can increase to either 4 pills of Primidone before bed or can try to increase Neurontin/Gabapentin to 600mg  three times a day. Please watch for sedation, risk of falls, somnolence.    Sarina Ill, MD  [facility] [address] [place], [place] [address]  Phone [phone] Fax [phone]  A total of 30 minutes was spent face-to-face with this patient. Over half this time was spent on counseling patient on the tremor and essential diagnosis and different diagnostic and therapeutic options available

## 2016-05-14 NOTE — Patient Instructions (Signed)
Remember to drink plenty of fluid, eat healthy meals and do not skip any meals. Try to eat protein with a every meal and eat a healthy snack such as fruit or nuts in between meals. Try to keep a regular sleep-wake schedule and try to exercise daily, particularly in the form of walking, 20-30 minutes a day, if you can.   As far as your medications are concerned, I would like to suggest: Increase Primidone to 150mg  at night (3 pills). Stay on this for 2 weeks to evaluate if it works. If needed can increase to either 4 pills of Primidone before bed or can try to increase Neurontin/Gabapentin to 600mg  three times a day. Please watch for sedation, risk of falls, somnolence.   As far as diagnostic testing:   I would like to see you back in 4 months, sooner if we need to. Please call us with any interim questions, concerns, problems, updates or refill requests.   Our phone number is [phone]. We also have an after hours call service for urgent matters and there is a physician on-call for urgent questions. For any emergencies you know to call 911 or go to the nearest emergency room

## 2016-05-26 DIAGNOSIS — L258 Unspecified contact dermatitis due to other agents: Secondary | ICD-10-CM | POA: Diagnosis not present

## 2016-05-27 ENCOUNTER — Ambulatory Visit: Payer: PPO | Admitting: Podiatry

## 2016-06-02 DIAGNOSIS — H5203 Hypermetropia, bilateral: Secondary | ICD-10-CM | POA: Diagnosis not present

## 2016-06-02 DIAGNOSIS — Z961 Presence of intraocular lens: Secondary | ICD-10-CM | POA: Diagnosis not present

## 2016-06-24 ENCOUNTER — Ambulatory Visit: Payer: PPO | Admitting: Podiatry

## 2016-06-30 ENCOUNTER — Telehealth: Payer: Self-pay | Admitting: Neurology

## 2016-06-30 NOTE — Telephone Encounter (Signed)
Updated pharmacy per pt request

## 2016-06-30 NOTE — Telephone Encounter (Signed)
Pt moved and wanted to change her pharmacy information [facility] [place] [facility] [address] [phone]

## 2016-07-01 ENCOUNTER — Encounter: Payer: Self-pay | Admitting: *Deleted

## 2016-07-22 ENCOUNTER — Telehealth: Payer: Self-pay | Admitting: Neurology

## 2016-07-22 NOTE — Telephone Encounter (Signed)
Connie/ [facility] [place] called needing clarification on gabapentin (NEURONTIN) 300 MG capsule. Please call [phone]

## 2016-07-22 NOTE — Telephone Encounter (Signed)
Pt moved and transferred pharmacies. [facility] [place]. They need new rx of gabapentin (NEURONTIN) 300 MG capsule. [id number] [place] [place]

## 2016-07-22 NOTE — Telephone Encounter (Signed)
Called Dana Klein back and clarified per Dr Jaynee Eagles that patient should take one capsule in the morning, one in the afternoon and 2 in the evening. She should be taking medication this way to start off and if needed,  she can increase to 2 capsules 3 times daily if she tolerates other instructions ok. She verbalized understanding.

## 2016-07-22 NOTE — Telephone Encounter (Signed)
Called and spoke to pt. Called pt old pharmacy and spoke to [place]. She stated since [facility] is [facility] another [facility], they can transfer rx for her and she can get refill. Advised Dr Jaynee Eagles sent rx on 6/28 with 11 refills. This can be transferred to her new pharmacy.  Called pt and relayed message. She verbalized understanding.

## 2016-07-23 NOTE — Telephone Encounter (Signed)
Received fax request from [facility] [place] [place] for refill on her gabapentin and primidone. I called pharmacy and spoke to [place]. She stated this has already been addressed yesterday. The patient has 11 refills on both medications per Lattie Haw.

## 2016-07-30 ENCOUNTER — Ambulatory Visit (HOSPITAL_BASED_OUTPATIENT_CLINIC_OR_DEPARTMENT_OTHER): Payer: PPO | Admitting: Oncology

## 2016-07-30 ENCOUNTER — Other Ambulatory Visit (HOSPITAL_BASED_OUTPATIENT_CLINIC_OR_DEPARTMENT_OTHER): Payer: PPO

## 2016-07-30 ENCOUNTER — Telehealth: Payer: Self-pay | Admitting: Oncology

## 2016-07-30 VITALS — BP 156/65 | HR 65 | Temp 98.6°F | Resp 20 | Ht 60.0 in | Wt 104.5 lb

## 2016-07-30 DIAGNOSIS — R590 Localized enlarged lymph nodes: Secondary | ICD-10-CM | POA: Diagnosis not present

## 2016-07-30 DIAGNOSIS — C801 Malignant (primary) neoplasm, unspecified: Secondary | ICD-10-CM | POA: Diagnosis not present

## 2016-07-30 LAB — COMPREHENSIVE METABOLIC PANEL
ALK PHOS: 83 U/L (ref 40–150)
ALT: 21 U/L (ref 0–55)
AST: 26 U/L (ref 5–34)
Albumin: 3.4 g/dL — ABNORMAL LOW (ref 3.5–5.0)
Anion Gap: 7 mEq/L (ref 3–11)
BUN: 18.4 mg/dL (ref 7.0–26.0)
CHLORIDE: 103 meq/L (ref 98–109)
CO2: 27 meq/L (ref 22–29)
Calcium: 9.8 mg/dL (ref 8.4–10.4)
Creatinine: 0.7 mg/dL (ref 0.6–1.1)
EGFR: 76 mL/min/{1.73_m2} — AB (ref >90)
GLUCOSE: 99 mg/dL (ref 70–140)
POTASSIUM: 4.6 meq/L (ref 3.5–5.1)
SODIUM: 137 meq/L (ref 136–145)
Total Bilirubin: <0.3 mg/dL (ref 0.20–1.20)
Total Protein: 6.3 g/dL — ABNORMAL LOW (ref 6.4–8.3)

## 2016-07-30 LAB — CBC WITH DIFFERENTIAL/PLATELET
BASO%: 0.6 % (ref 0.0–2.0)
BASOS ABS: 0 10*3/uL (ref 0.0–0.1)
EOS ABS: 0.1 10*3/uL (ref 0.0–0.5)
EOS%: 1.6 % (ref 0.0–7.0)
HCT: 36.4 % (ref 34.8–46.6)
HEMOGLOBIN: 11.8 g/dL (ref 11.6–15.9)
LYMPH%: 28.4 % (ref 14.0–49.7)
MCH: 31.8 pg (ref 25.1–34.0)
MCHC: 32.3 g/dL (ref 31.5–36.0)
MCV: 98.5 fL (ref 79.5–101.0)
MONO#: 0.3 10*3/uL (ref 0.1–0.9)
MONO%: 6.3 % (ref 0.0–14.0)
NEUT#: 2.7 10*3/uL (ref 1.5–6.5)
NEUT%: 63.1 % (ref 38.4–76.8)
Platelets: 207 10*3/uL (ref 145–400)
RBC: 3.7 10*6/uL (ref 3.70–5.45)
RDW: 13.8 % (ref 11.2–14.5)
WBC: 4.3 10*3/uL (ref 3.9–10.3)
lymph#: 1.2 10*3/uL (ref 0.9–3.3)

## 2016-07-30 NOTE — Progress Notes (Signed)
Hematology and Oncology Follow Up Visit  Dana Klein [id number] 1928-06-08 82 y.o. 07/30/2016 3:02 PM Klein,Dana, MDRamachandran, Dana Kaufmann, MD   Principle Diagnosis: 82 year old woman with metastatic carcinoma of unknown primary. She presented with retroperitoneal adenopathy largest measuring 2.8 cm diagnosed in December 2016 after a CT-guided biopsy showed a poorly differentiated carcinoma. The primary malignancy has not been identified with a pathology indicating possibly pancreatic biliary etiology.   Current therapy: Observation and surveillance.  Interim History: Dana Klein presents today for a follow-up visit. Since the last visit, she reports doing well without any recent complaints. She does report chronic back pain which is no change since last visit. She remains very active despite her age and diagnosis. She denied any abdominal pain, distention or weight loss. Her appetite remains reasonable. She denied any GI or GU bleeding.  She does not report any headaches, blurry vision, or seizures. She does not report any fevers, chills or sweats. Does not report any cough, wheezing, hemoptysis or dyspnea on exertion. Does not report any chest pain, palpitation, orthopnea or leg edema. Does not report any nausea, vomiting or abdominal pain. Does not report any constipation, diarrhea, hematochezia or melena. She does not report any frequency urgency or hesitancy. She does not report any skeletal complaints. Remaining review of systems unremarkable.   Medications: I have reviewed the patient's current medications.  Current Outpatient Prescriptions  Medication Sig Dispense Refill  . acetaminophen (TYLENOL) 500 MG tablet Take 500 mg by mouth 2 (two) times daily with a meal.    . aspirin EC 81 MG tablet Take 81 mg by mouth 2 (two) times a week.    . Calcium Carbonate-Vitamin D (CALCIUM-D PO) Take 1 tablet by mouth 2 (two) times daily with a meal. Triple Strength    . docusate sodium (COLACE)  100 MG capsule Take 100 mg by mouth 2 (two) times daily with a meal. As needed for constipation    . DULoxetine (CYMBALTA) 30 MG capsule Take 30 mg by mouth daily.  12  . gabapentin (NEURONTIN) 300 MG capsule Take 2 capsules (600 mg total) by mouth 3 (three) times daily. Take one capsule in the morning, one in the afternoon and 2 in the evening 180 capsule 11  . levothyroxine (SYNTHROID, LEVOTHROID) 50 MCG tablet Take 50 mcg by mouth daily before breakfast.    . magnesium oxide (MAG-OX) 400 MG tablet Take 400 mg by mouth at bedtime.     . meloxicam (MOBIC) 15 MG tablet Take 7.5 mg by mouth 2 (two) times daily with a meal. 1/2 TAB IN AM    . Misc Natural Products (OSTEO BI-FLEX ADV JOINT SHIELD PO) Take 1 capsule by mouth daily.    . Multiple Vitamin (MULTIVITAMIN WITH MINERALS) TABS tablet Take 1 tablet by mouth daily.    . Omega-3 Fatty Acids (FISH OIL) 1000 MG CAPS Take 1,000 mg by mouth 2 (two) times daily with a meal.    . Polyethyl Glycol-Propyl Glycol (SYSTANE ULTRA OP) Place 1 drop into both eyes daily.    Vladimir Faster Glycol-Propyl Glycol (SYSTANE) 0.4-0.3 % GEL ophthalmic gel Place 1 application into both eyes at bedtime.    . primidone (MYSOLINE) 50 MG tablet Take 4 tablets (200 mg total) by mouth at bedtime. 120 tablet 12  . rOPINIRole (REQUIP) 1 MG tablet Take 0.5 mg by mouth every evening. 3 hours before bedtime    . solifenacin (VESICARE) 10 MG tablet Take 10 mg by mouth daily.  No current facility-administered medications for this visit.      Allergies:  Allergies  Allergen Reactions  . Propranolol Itching and Rash    Rash All over   . Sulfa Antibiotics Rash    Past Medical History, Surgical history, Social history, and Family History were reviewed and updated.   Physical Exam: Blood pressure (!) 156/65, pulse 65, temperature 98.6 F (37 C), temperature source Oral, resp. rate 20, height 5' (1.524 m), weight 104 lb 8 oz (47.4 kg), SpO2 100 %. ECOG: 1 General  appearance: Well-appearing woman without distress. Head: Normocephalic, without obvious abnormality no oral thrush noted. Neck: no adenopathy Lymph nodes: Cervical, supraclavicular, and axillary nodes normal. Heart:regular rate and rhythm, S1, S2 normal, no murmur, click, rub or gallop Lung:chest clear, no wheezing, rales, normal symmetric air entry Abdomin: soft, non-tender, without masses or organomegaly no shifting dullness or ascites. EXT:no erythema, induration, or nodules Neurological examination: No deficits.  Lab Results: Lab Results  Component Value Date   WBC 4.3 07/30/2016   HGB 11.8 07/30/2016   HCT 36.4 07/30/2016   MCV 98.5 07/30/2016   PLT 207 07/30/2016     Chemistry      Component Value Date/Time   NA 136 05/08/2016 1447   K 4.5 05/08/2016 1447   CL 99 02/02/2015 1741   CO2 28 05/08/2016 1447   BUN 17.9 05/08/2016 1447   CREATININE 0.7 05/08/2016 1447      Component Value Date/Time   CALCIUM 9.9 05/08/2016 1447   ALKPHOS 71 05/08/2016 1447   AST 24 05/08/2016 1447   ALT 20 05/08/2016 1447   BILITOT 0.30 05/08/2016 1447       Impression and Plan:  82 year old woman with the following issues:  1. Metastatic carcinoma of unknown primary. It is unclear if this is a metastatic from a GI, GU or pancreatic biliary tract. Her set of disease include retroperitoneal adenopathy with the largest of which around 2.8 cm.   PET CT scan obtained on 01/21/2016 showed hypermetabolic lymphadenopathy with the largest lymph node measuring about 1.6 cm. There are questionable left supraclavicular lymph nodes as well. No solid tumor malignancy have been identified.  She is currently on active surveillance at this time and supportive management. She has declined systemic chemotherapy and would like to defer that option unless absolutely needed to. She feels that she has no symptoms and chemotherapy might interfere with her quality of life.  The plan at at this time is to  continue with observation and surveillance and consider palliative systemic therapy she develops any symptoms in the future. We'll repeat imaging studies she develops any symptoms.  2. Follow-up: Will be in 4 months for clinical visit.  [facility], MD 9/13/20173:02 PM

## 2016-07-30 NOTE — Telephone Encounter (Signed)
Gave patient avs report and appointments schedule for January. Per FS lab/fu 4 months.

## 2016-09-03 DIAGNOSIS — E7800 Pure hypercholesterolemia, unspecified: Secondary | ICD-10-CM | POA: Diagnosis not present

## 2016-09-03 DIAGNOSIS — E039 Hypothyroidism, unspecified: Secondary | ICD-10-CM | POA: Diagnosis not present

## 2016-09-03 DIAGNOSIS — N390 Urinary tract infection, site not specified: Secondary | ICD-10-CM | POA: Diagnosis not present

## 2016-09-03 DIAGNOSIS — C799 Secondary malignant neoplasm of unspecified site: Secondary | ICD-10-CM | POA: Diagnosis not present

## 2016-09-10 DIAGNOSIS — E039 Hypothyroidism, unspecified: Secondary | ICD-10-CM | POA: Diagnosis not present

## 2016-09-10 DIAGNOSIS — C799 Secondary malignant neoplasm of unspecified site: Secondary | ICD-10-CM | POA: Diagnosis not present

## 2016-09-10 DIAGNOSIS — E7800 Pure hypercholesterolemia, unspecified: Secondary | ICD-10-CM | POA: Diagnosis not present

## 2016-09-10 DIAGNOSIS — I10 Essential (primary) hypertension: Secondary | ICD-10-CM | POA: Diagnosis not present

## 2016-09-24 ENCOUNTER — Encounter: Payer: Self-pay | Admitting: Neurology

## 2016-09-24 ENCOUNTER — Ambulatory Visit (INDEPENDENT_AMBULATORY_CARE_PROVIDER_SITE_OTHER): Payer: PPO | Admitting: Neurology

## 2016-09-24 VITALS — BP 123/61 | HR 65 | Ht 60.0 in | Wt 100.8 lb

## 2016-09-24 DIAGNOSIS — G250 Essential tremor: Secondary | ICD-10-CM

## 2016-09-24 MED ORDER — CLONAZEPAM 0.5 MG PO TABS: ORAL_TABLET | ORAL | 4 refills | Status: DC

## 2016-09-24 NOTE — Progress Notes (Signed)
[id number]   Provider:  Dr Jaynee Klein Referring Provider: [name], MD Primary Care Physician:  Dana Seashore, MD  CC: Essential tremor  Interval history: [name] here for follow-up of essential tremor. We have tried multiple medications.Currently on 150mg  primidone, neurontin 300mg  tid. She is overworked and tired, doing a lot of yard work. No falls. Primidone makes her tired. She lives near her daughter now in [place] who is here with her today. We discussed multiple other options, she has had side effects to medications and a fall after taking propranolol. One option is not to take any more medications however patient feels that her mouth tremor is significantly disturbing and she would like to try something else. Mouth tremors can be the most refractory in this condition. At this point we could try Klonopin and decrease her primidone. I did discuss the risks of Clonopin especially in the elderly, risk for falls, sedation, as well as dependency and withdrawal. Patient feels that the benefits outweigh the risks    Interval history 05/15/2016: 82 year old patient here for tremor. She is on 2 tabs of primidone at night. She is beginning to drool. Would increase to 3 or 4 tablets at night.She is having balance problems, I recommend physical therapy. She is moving to [employer]. When she stands up she often has to step back or side step. She is moving into a house out there close to where she was born, close to sister and daughter. Would go up on the primidone by one pill at night. Can also increase her gabapentin as this is also a medication that may help with essential tremor.   Interval history 01/16/2016: Sheis here for follow up on Tremor is worsening and now involving her chin worse. She is only on 1/2 pill of 50mg  Primidone. Will slowly increase as tolerated. Patient can increase to a pill at night for 1-2 weeks and then go up by 1/2 a  pill every few weeks as tolerated, just be careful for sedation ans side effects and risk of falls especially if she gets up in the middle of the night. She is going through testing for possible cancer, she is going to have a PET scan soon. No more falls, not getting on the roof or cleaning anymore. She is still cleaning up leaves and getting in the year and taking care of her house.She has some difficulty relaxing her legs. But her tone feels good without spasticity or anything that appears pathologic. She has some stiffness in the legs and ankles and joints due to osteoarthritis. Advised if she is in the kitchen make sure she has the spongy matts to help instead of standing on something hard, just ensure they are not slippery and well attached to the floor. She has some imbalance, advised her to use her cane and walker all the time. Also get up slowly. She has back pain and osteoarthritis as well contributing to her imbalance. She wears her back brace all the time when doing activities.    Interval history 07/19/2015: She was evaluated by Dana Klein in May who confirmed essential tremor. The tremor is still there. She is still taking the primidone at night. Her legs hurt, they are stiff and her feet hurt. She denies any sensory changes, no burning or tingling. They are very stiff. She has some redness and discoloration on her feet. She feels stiffness and swelling in the lower extremity. At the end of the day they are  swollen. No cramping in the feet. Movement of the toes hurts, she keeps saying stiffness. Denies tingling, burning. It hurts all the time. They don't keep her awake. She is taking the neurontin twice daily. She is tolerating the neurontin really well. Will increase it to three times a day. One am, one afternoon and 2 pm. Discussed with patient that the symptoms in her feet are more likely due to osteoarthritis then neuropathy. I do recommend she follow up with her primary care. Can increase  Neurontin to 3 times a day and see if that helps. We'll continue on current dose of primidone due to sedation risk, also Neurontin is good for essential tremor so increasing that may help as well.   Interval history 03/07/2015: Dana Klein is a 82 y.o. female here as a follow up. She fell down the stairs. Broke her arm. She was hospitalized. Reviewed images of her brain that were done while she was in the hospital. She is taking 25mg  of the primidone. She will start taking the whole tablet.   MRI of the brain: personally reviewed images of the brain and agree with the following:  IMPRESSION: No acute brain infarction. The cerebellum does not show any focal insult.  Mild age related atrophy and chronic small vessel change, less than often seen in healthy individuals of this age.  Some inflammatory change of the maxillary sinuses. Small amount of fluid in the mastoid air cells left more than right.  HPI: Dana Klein is a 82 y.o. female here as a follow up.  She worked out in the yard and is having pain in the anterior legs likely from overuse. She is a little slow, she hasn't been walking outside as much. Now that the weather is warm she can go back. Daughter denies hypophonia, changes in handwriting. She has hearing difficulty and usually talks too loud. Daughter feels expression is normal but family members have noticed the mild mouth tremor. Sinemet does not help her symptoms.   Emg/ncs 10/20: There is electrophysiologic evidence of a symmetric length-dependent severe axonal sensorimotor polyneuropathy. In addition, there is evidence of a moderately-severe right median nerve entrapment at the wrist. Decreased insertional activity of distal leg muscles could be due to atrophy. Clinical correlation suggested   Initial visit 08/29/2014: [name] patient who was previously seeing Dana Klein and is now following up with me. She was referred for tremor and was diagnosed with PD  and is on Sinemet 3 times a day. Took her pill at 8:15 am so it is wearing off now. Tremor significantly improves with the medication. Her handwriting improves with the medication as well. Doesn't pay attention if she has any significant wearing off unless she is sitting reading but usually she is working outside and doesn't notice a tremor in fact was outside last night and just forgot to take her medication. No dyskinesias. Sometimes will fall over if she doesn't catch herself and "goes over". Using the cane when she goes out. At home doesn't use the cane. In the house she is fine, she can hold on and doesn't need it. Lives alone. Has a life alert necklace. Always wears it. Swallowing is fine, no choking. Sleeping well, uses requip for RLS. No rem sleep disorder. Not tired during the day.Notes some difficulty with short term memory, forgetting peoples names. Feels it is mild and not bothersome at this time  Had back surgery in 2010 and experiencing some low back pain and tightness in her leg muscles  below the knees. She had a fracture. Back hurts when she bends over. If she wears tennis shoes, feels like a vice around the feet. No bruning, no numbness, no tingling. Elevating the legs help.   Reviewed notes, labs and imaging from outside physicians, which showed: Initial visit 08/2013, tremor started a year previous both hands (unsure if started in one or other or both) and mostly at rest. Feels walking is slow, unsteady, trouble turning. Good sense of smell.   Has insomnia, is stable. Requip and Neurontin for RLS helps her sleep. Recent labs all unremarkable: cbc, crp, cmp, b12  Review of Systems: Patient complains of symptoms per HPI as well as the following symptoms: Eye itching, hearing loss, constipation, incontinence of bladder, bruising, numbness, tremors, aching muscles. Pertinent negatives per HPI. All others negative.  Social History   Social History  . Marital status: Widowed     Spouse name: N/A  . Number of children: 1  . Years of education: 71   Occupational History  .  Retired    Retired   Social History Main Topics  . Smoking status: Never Smoker  . Smokeless tobacco: Never Used  . Alcohol use No  . Drug use: No  . Sexual activity: Not on file   Other Topics Concern  . Not on file   Social History Narrative   Patient is a widow . Patient is retired and has a Dance movement psychotherapist.   Right handed.   Caffeine- One cup daily coffee.    Family History  Problem Relation Age of Onset  . Lung cancer Mother   . Colon cancer Mother   . Pneumonia Father   . Heart defect Father   . Diabetes Sister   . Diabetes Brother   . Diabetes Sister   . Diabetes Brother     Past Medical History:  Diagnosis Date  . Back pain   . Bladder incontinence   . Hearing loss   . Leg numbness   . Numbness of foot   . Osteoarthritis   . Thyroid disease     Past Surgical History:  Procedure Laterality Date  . ABDOMINAL HYSTERECTOMY    . BACK SURGERY    . CARPAL TUNNEL RELEASE    . Left Wrist Surgery      Current Outpatient Prescriptions  Medication Sig Dispense Refill  . acetaminophen (TYLENOL) 500 MG tablet Take 500 mg by mouth 2 (two) times daily with a meal.    . aspirin EC 81 MG tablet Take 81 mg by mouth 2 (two) times a week.    . Calcium Carbonate-Vitamin D (CALCIUM-D PO) Take 1 tablet by mouth 2 (two) times daily with a meal. Triple Strength    . docusate sodium (COLACE) 100 MG capsule Take 100 mg by mouth 2 (two) times daily with a meal. As needed for constipation    . DULoxetine (CYMBALTA) 30 MG capsule Take 30 mg by mouth daily.  12  . gabapentin (NEURONTIN) 300 MG capsule Take 2 capsules (600 mg total) by mouth 3 (three) times daily. Take one capsule in the morning, one in the afternoon and 2 in the evening 180 capsule 11  . levothyroxine (SYNTHROID, LEVOTHROID) 50 MCG tablet Take 75 mcg by mouth daily before breakfast.     . magnesium oxide  (MAG-OX) 400 MG tablet Take 400 mg by mouth at bedtime.     . meloxicam (MOBIC) 15 MG tablet Take 7.5 mg by mouth 2 (two) times daily with  a meal. 1/2 TAB IN AM    . Misc Natural Products (OSTEO BI-FLEX ADV JOINT SHIELD PO) Take 1 capsule by mouth daily.    . Multiple Vitamin (MULTIVITAMIN WITH MINERALS) TABS tablet Take 1 tablet by mouth daily.    . Omega-3 Fatty Acids (FISH OIL) 1000 MG CAPS Take 1,000 mg by mouth 2 (two) times daily with a meal.    . Polyethyl Glycol-Propyl Glycol (SYSTANE ULTRA OP) Place 1 drop into both eyes daily.    Vladimir Faster Glycol-Propyl Glycol (SYSTANE) 0.4-0.3 % GEL ophthalmic gel Place 1 application into both eyes at bedtime.    . primidone (MYSOLINE) 50 MG tablet Take 2 tablets (100 mg total) by mouth at bedtime. 120 tablet 12  . rOPINIRole (REQUIP) 1 MG tablet Take 0.5 mg by mouth every evening. 3 hours before bedtime    . solifenacin (VESICARE) 10 MG tablet Take 10 mg by mouth daily.     . clonazePAM (KLONOPIN) 0.5 MG tablet Take 1 tablet (0.5 mg total) by mouth 2 (two) times daily for essential tremor. 60 tablet 4   No current facility-administered medications for this visit.     Allergies as of 09/24/2016 - Review Complete 09/24/2016  Allergen Reaction Noted  . Propranolol Itching and Rash 02/18/2015  . Sulfa antibiotics Rash 06/10/2011    Vitals: BP 123/61 (BP Location: Right Arm, Patient Position: Sitting, Cuff Size: Normal)   Pulse 65   Ht 5' (1.524 m)   Wt 100 lb 12.8 oz (45.7 kg)   BMI 19.69 kg/m  Last Weight:  Wt Readings from Last 1 Encounters:  09/24/16 100 lb 12.8 oz (45.7 kg)   Last Height:   Ht Readings from Last 1 Encounters:  09/24/16 5' (1.524 m)     Speech:  Speech is normal; fluent and spontaneous with normal comprehension.  Cognition:  The patient is oriented to person, place, and time;   Cranial Nerves:  The pupils are equal, round, and reactive to light. Visual fields are full to finger confrontation.  Extraocular movements are intact. Trigeminal sensation is intact and the muscles of mastication are normal. The face is symmetric. The palate elevates in the midline. Hearing intact. Voice is normal. Shoulder shrug is normal. The tongue has normal motion without fasciculations.   Coordination:  Normal finger to nose and heel to shin. Normal rapid alternating movements.   Gait:  Mildly stooped, turns slowly, narrow but not shuffling.  Motor Observation:  No resting tremor. Bilateral upper extremity postural and action tremor in the hands. High-frequency low amplitude. Chin tremor.  Tone:  Normal muscle tone.   Strength:  Strength is V/V in the upper and lower limbs.    Sensation: Decreased pinprick and temperature distally    Assessment/Plan: 37 82 year old woman presenting for follow up evaluation of history of bilateral hand tremor and muscle cramping. Based on clinical history and physical exam her findings are most consistent with a diagnosis of essential tremor. Today she is also complaining of stiffness in the feet.  - Stiffness in the feet likely arthritic as opposed to worsening of neuropathy. - Essential tremor: propranolol with rash and fall. On neurontin and primidone. Primidone makes her very tired. patient feels that her mouth tremor is significantly disturbing and she would like to try something else. Mouth tremors can be the most refractory in this condition. At this point we could try Klonopin and decrease her primidone. I did discuss the risks of Klonopin especially in the elderly, risk for falls,  sedation, as well as dependency and withdrawal. Patient feels that the benefits outweigh the risks - continue Requip 1mg  qhs for RLS -check B12 level : normal - always use walking aid even in the house, fall precautions. Physical therapy for balance and safety - Memory: monitor clinically - insomnia: good sleep hygiene - Neuropathy: Continue  Neurontin dose which may also be helpful for essential tremor.  As far as your medications are concerned, I would like to suggest: Start Klonopin for essential tremor.  did discuss the risks of Klonopin especially in the elderly, risk for falls, sedation, as well as dependency and withdrawal. Patient feels that the benefits outweigh the risks.    Sarina Ill, MD  [facility] [address] [place], [place] [address]  Phone [phone] Fax [phone]  A total of 30 minutes was spent face-to-face with this patient. Over half this time was spent on counseling patient on the tremor and essential diagnosis and different diagnostic and therapeutic options available

## 2016-09-24 NOTE — Patient Instructions (Signed)
Remember to drink plenty of fluid, eat healthy meals and do not skip any meals. Try to eat protein with a every meal and eat a healthy snack such as fruit or nuts in between meals. Try to keep a regular sleep-wake schedule and try to exercise daily, particularly in the form of walking, 20-30 minutes a day, if you can.   As far as your medications are concerned, I would like to suggest: Clonazepam 0.5mg  twice daily for tremor  I would like to see you back in 6 months, sooner if we need to. Please call us with any interim questions, concerns, problems, updates or refill requests.   Our phone number is [phone]. We also have an after hours call service for urgent matters and there is a physician on-call for urgent questions. For any emergencies you know to call 911 or go to the nearest emergency room  Clonazepam tablets What is this medicine? CLONAZEPAM (kloe NA ze pam) is a benzodiazepine. It is used to treat certain types of seizures. It is also used to treat panic disorder. This medicine may be used for other purposes; ask your health care provider or pharmacist if you have questions. What should I tell my health care provider before I take this medicine? They need to know if you have any of these conditions: -an alcohol or drug abuse problem -bipolar disorder, depression, psychosis or other mental health condition -glaucoma -kidney or liver disease -lung or breathing disease -myasthenia gravis -Parkinson's disease -porphyria -seizures or a history of seizures -suicidal thoughts -an unusual or allergic reaction to clonazepam, other benzodiazepines, foods, dyes, or preservatives -pregnant or trying to get pregnant -breast-feeding How should I use this medicine? Take this medicine by mouth with a glass of water. Follow the directions on the prescription label. If it upsets your stomach, take it with food or milk. Take your medicine at regular intervals. Do not take it more often than  directed. Do not stop taking or change the dose except on the advice of your doctor or health care professional. A special MedGuide will be given to you by the pharmacist with each prescription and refill. Be sure to read this information carefully each time. Talk to your pediatrician regarding the use of this medicine in children. Special care may be needed. Overdosage: If you think you have taken too much of this medicine contact a poison control center or emergency room at once. NOTE: This medicine is only for you. Do not share this medicine with others. What if I miss a dose? If you miss a dose, take it as soon as you can. If it is almost time for your next dose, take only that dose. Do not take double or extra doses. What may interact with this medicine? -herbal or dietary supplements -medicines for depression, anxiety, or psychotic disturbances -medicines for fungal infections like fluconazole, itraconazole, ketoconazole, voriconazole -medicines for HIV infection or AIDS -medicines for sleep -prescription pain medicines -propantheline -rifampin -sevelamer -some medicines for seizures like carbamazepine, phenobarbital, phenytoin, primidone This list may not describe all possible interactions. Give your health care provider a list of all the medicines, herbs, non-prescription drugs, or dietary supplements you use. Also tell them if you smoke, drink alcohol, or use illegal drugs. Some items may interact with your medicine. What should I watch for while using this medicine? Visit your doctor or health care professional for regular checks on your progress. Your body may become dependent on this medicine. If you have been taking this  medicine regularly for some time, do not suddenly stop taking it. You must gradually reduce the dose or you may get severe side effects. Ask your doctor or health care professional for advice before increasing or decreasing the dose. Even after you stop taking this  medicine it can still affect your body for several days. If you suffer from several types of seizures, this medicine may increase the chance of grand mal seizures (epilepsy). Let your doctor or health care professional know, he or she may want to prescribe an additional medicine. You may get drowsy or dizzy. Do not drive, use machinery, or do anything that needs mental alertness until you know how this medicine affects you. To reduce the risk of dizzy and fainting spells, do not stand or sit up quickly, especially if you are an older patient. Alcohol may increase dizziness and drowsiness. Avoid alcoholic drinks. Do not treat yourself for coughs, colds or allergies without asking your doctor or health care professional for advice. Some ingredients can increase possible side effects. The use of this medicine may increase the chance of suicidal thoughts or actions. Pay special attention to how you are responding while on this medicine. Any worsening of mood, or thoughts of suicide or dying should be reported to your health care professional right away. Women who become pregnant while using this medicine may enroll in the [place] Pregnancy Registry by calling [phone]. This registry collects information about the safety of antiepileptic drug use during pregnancy. What side effects may I notice from receiving this medicine? Side effects that you should report to your doctor or health care professional as soon as possible: -allergic reactions like skin rash, itching or hives, swelling of the face, lips, or tongue -changes in vision -confusion -depression -hallucinations -mood changes, excitability or aggressive behavior -movement difficulty, staggering or jerky movements -muscle cramps, weakness -tremors -unusual eye movements Side effects that usually do not require medical attention (report to your doctor or health care professional if they continue or are  bothersome): -constipation or diarrhea -difficulty sleeping, nightmares -dizziness, drowsiness -headache -increased saliva from your mouth -nausea, vomiting This list may not describe all possible side effects. Call your doctor for medical advice about side effects. You may report side effects to FDA at 1-800-FDA-1088. Where should I keep my medicine? Keep out of the reach of children. This medicine can be abused. Keep your medicine in a safe place to protect it from theft. Do not share this medicine with anyone. Selling or giving away this medicine is dangerous and against the law. This medicine may cause accidental overdose and death if taken by other adults, children, or pets. Mix any unused medicine with a substance like cat litter or coffee grounds. Then throw the medicine away in a sealed container like a sealed bag or a coffee can with a lid. Do not use the medicine after the expiration date. Store at room temperature between 15 and 30 degrees C (59 and 86 degrees F). Protect from light. Keep container tightly closed. NOTE: This sheet is a summary. It may not cover all possible information. If you have questions about this medicine, talk to your doctor, pharmacist, or health care provider.    2016, [employer]. (2015-02-13 14:01:43)

## 2016-09-25 DIAGNOSIS — C772 Secondary and unspecified malignant neoplasm of intra-abdominal lymph nodes: Secondary | ICD-10-CM | POA: Diagnosis not present

## 2016-09-25 MED ORDER — PRIMIDONE 50 MG PO TABS: 100.0000 mg | ORAL_TABLET | Freq: Every day | ORAL | 12 refills | Status: DC

## 2016-10-02 DIAGNOSIS — Z79899 Other long term (current) drug therapy: Secondary | ICD-10-CM | POA: Diagnosis not present

## 2016-10-02 DIAGNOSIS — C801 Malignant (primary) neoplasm, unspecified: Secondary | ICD-10-CM | POA: Diagnosis not present

## 2016-10-02 DIAGNOSIS — E039 Hypothyroidism, unspecified: Secondary | ICD-10-CM | POA: Diagnosis not present

## 2016-10-02 DIAGNOSIS — R978 Other abnormal tumor markers: Secondary | ICD-10-CM | POA: Diagnosis not present

## 2016-10-02 DIAGNOSIS — Z882 Allergy status to sulfonamides status: Secondary | ICD-10-CM | POA: Diagnosis not present

## 2016-10-02 DIAGNOSIS — R970 Elevated carcinoembryonic antigen [CEA]: Secondary | ICD-10-CM | POA: Diagnosis not present

## 2016-10-02 DIAGNOSIS — Z888 Allergy status to other drugs, medicaments and biological substances status: Secondary | ICD-10-CM | POA: Diagnosis not present

## 2016-10-13 DIAGNOSIS — R87615 Unsatisfactory cytologic smear of cervix: Secondary | ICD-10-CM | POA: Diagnosis not present

## 2016-10-13 DIAGNOSIS — Z779 Other contact with and (suspected) exposures hazardous to health: Secondary | ICD-10-CM | POA: Diagnosis not present

## 2016-10-15 DIAGNOSIS — C801 Malignant (primary) neoplasm, unspecified: Secondary | ICD-10-CM | POA: Diagnosis not present

## 2016-10-15 DIAGNOSIS — C772 Secondary and unspecified malignant neoplasm of intra-abdominal lymph nodes: Secondary | ICD-10-CM | POA: Diagnosis not present

## 2016-10-16 DIAGNOSIS — Z7982 Long term (current) use of aspirin: Secondary | ICD-10-CM | POA: Diagnosis not present

## 2016-10-16 DIAGNOSIS — E039 Hypothyroidism, unspecified: Secondary | ICD-10-CM | POA: Diagnosis not present

## 2016-10-16 DIAGNOSIS — R590 Localized enlarged lymph nodes: Secondary | ICD-10-CM | POA: Diagnosis not present

## 2016-10-16 DIAGNOSIS — C801 Malignant (primary) neoplasm, unspecified: Secondary | ICD-10-CM | POA: Diagnosis not present

## 2016-11-19 DIAGNOSIS — E7800 Pure hypercholesterolemia, unspecified: Secondary | ICD-10-CM | POA: Diagnosis not present

## 2016-11-19 DIAGNOSIS — E039 Hypothyroidism, unspecified: Secondary | ICD-10-CM | POA: Diagnosis not present

## 2016-11-24 ENCOUNTER — Telehealth: Payer: Self-pay | Admitting: Oncology

## 2016-11-24 NOTE — Telephone Encounter (Signed)
advised pt that appointment has been changed from 11/26/16 to 12/24/16 at 1:15 PM

## 2016-11-26 ENCOUNTER — Other Ambulatory Visit: Payer: PPO

## 2016-11-26 ENCOUNTER — Ambulatory Visit: Payer: PPO | Admitting: Oncology

## 2016-11-26 DIAGNOSIS — R600 Localized edema: Secondary | ICD-10-CM | POA: Diagnosis not present

## 2016-11-26 DIAGNOSIS — E039 Hypothyroidism, unspecified: Secondary | ICD-10-CM | POA: Diagnosis not present

## 2016-11-26 DIAGNOSIS — C799 Secondary malignant neoplasm of unspecified site: Secondary | ICD-10-CM | POA: Diagnosis not present

## 2016-11-26 DIAGNOSIS — J309 Allergic rhinitis, unspecified: Secondary | ICD-10-CM | POA: Diagnosis not present

## 2016-12-09 DIAGNOSIS — C801 Malignant (primary) neoplasm, unspecified: Secondary | ICD-10-CM | POA: Diagnosis not present

## 2016-12-10 DIAGNOSIS — E7800 Pure hypercholesterolemia, unspecified: Secondary | ICD-10-CM | POA: Diagnosis not present

## 2016-12-10 DIAGNOSIS — M797 Fibromyalgia: Secondary | ICD-10-CM | POA: Diagnosis not present

## 2016-12-10 DIAGNOSIS — R600 Localized edema: Secondary | ICD-10-CM | POA: Diagnosis not present

## 2016-12-10 DIAGNOSIS — E039 Hypothyroidism, unspecified: Secondary | ICD-10-CM | POA: Diagnosis not present

## 2016-12-18 DIAGNOSIS — D72810 Lymphocytopenia: Secondary | ICD-10-CM | POA: Diagnosis not present

## 2016-12-18 DIAGNOSIS — E871 Hypo-osmolality and hyponatremia: Secondary | ICD-10-CM | POA: Diagnosis not present

## 2016-12-18 DIAGNOSIS — E878 Other disorders of electrolyte and fluid balance, not elsewhere classified: Secondary | ICD-10-CM | POA: Diagnosis not present

## 2016-12-18 DIAGNOSIS — I10 Essential (primary) hypertension: Secondary | ICD-10-CM | POA: Diagnosis not present

## 2016-12-18 DIAGNOSIS — C801 Malignant (primary) neoplasm, unspecified: Secondary | ICD-10-CM | POA: Diagnosis not present

## 2016-12-18 DIAGNOSIS — R978 Other abnormal tumor markers: Secondary | ICD-10-CM | POA: Diagnosis not present

## 2016-12-18 DIAGNOSIS — D709 Neutropenia, unspecified: Secondary | ICD-10-CM | POA: Diagnosis not present

## 2016-12-18 DIAGNOSIS — E039 Hypothyroidism, unspecified: Secondary | ICD-10-CM | POA: Diagnosis not present

## 2016-12-24 ENCOUNTER — Other Ambulatory Visit (HOSPITAL_BASED_OUTPATIENT_CLINIC_OR_DEPARTMENT_OTHER): Payer: PPO

## 2016-12-24 ENCOUNTER — Ambulatory Visit (HOSPITAL_BASED_OUTPATIENT_CLINIC_OR_DEPARTMENT_OTHER): Payer: PPO | Admitting: Oncology

## 2016-12-24 ENCOUNTER — Telehealth: Payer: Self-pay | Admitting: Oncology

## 2016-12-24 VITALS — BP 170/66 | HR 63 | Temp 97.9°F | Resp 17 | Wt 99.9 lb

## 2016-12-24 DIAGNOSIS — C801 Malignant (primary) neoplasm, unspecified: Secondary | ICD-10-CM

## 2016-12-24 DIAGNOSIS — R590 Localized enlarged lymph nodes: Secondary | ICD-10-CM

## 2016-12-24 LAB — COMPREHENSIVE METABOLIC PANEL
ALT: 17 U/L (ref 0–55)
AST: 21 U/L (ref 5–34)
Albumin: 3.8 g/dL (ref 3.5–5.0)
Alkaline Phosphatase: 90 U/L (ref 40–150)
Anion Gap: 5 mEq/L (ref 3–11)
BUN: 16.8 mg/dL (ref 7.0–26.0)
CO2: 31 meq/L — AB (ref 22–29)
Calcium: 10.4 mg/dL (ref 8.4–10.4)
Chloride: 99 mEq/L (ref 98–109)
Creatinine: 0.6 mg/dL (ref 0.6–1.1)
EGFR: 82 mL/min/{1.73_m2} — AB (ref >90)
GLUCOSE: 69 mg/dL — AB (ref 70–140)
POTASSIUM: 4.7 meq/L (ref 3.5–5.1)
SODIUM: 135 meq/L — AB (ref 136–145)
Total Bilirubin: 0.38 mg/dL (ref 0.20–1.20)
Total Protein: 6.6 g/dL (ref 6.4–8.3)

## 2016-12-24 LAB — CBC WITH DIFFERENTIAL/PLATELET
BASO%: 0.9 % (ref 0.0–2.0)
BASOS ABS: 0 10*3/uL (ref 0.0–0.1)
EOS ABS: 0.1 10*3/uL (ref 0.0–0.5)
EOS%: 1.5 % (ref 0.0–7.0)
HCT: 37.9 % (ref 34.8–46.6)
HGB: 12.4 g/dL (ref 11.6–15.9)
LYMPH%: 25.7 % (ref 14.0–49.7)
MCH: 32.2 pg (ref 25.1–34.0)
MCHC: 32.7 g/dL (ref 31.5–36.0)
MCV: 98.4 fL (ref 79.5–101.0)
MONO#: 0.3 10*3/uL (ref 0.1–0.9)
MONO%: 6.3 % (ref 0.0–14.0)
NEUT#: 3 10*3/uL (ref 1.5–6.5)
NEUT%: 65.6 % (ref 38.4–76.8)
Platelets: 195 10*3/uL (ref 145–400)
RBC: 3.85 10*6/uL (ref 3.70–5.45)
RDW: 13.8 % (ref 11.2–14.5)
WBC: 4.6 10*3/uL (ref 3.9–10.3)
lymph#: 1.2 10*3/uL (ref 0.9–3.3)

## 2016-12-24 NOTE — Progress Notes (Signed)
Hematology and Oncology Follow Up Visit  Dana Klein [id number] December 17, 1927 83 y.o. 12/24/2016 1:48 PM RAMACHANDRAN,Klein, MDRamachandran, Dana Kaufmann, MD   Principle Diagnosis: 83 year old woman with metastatic carcinoma of unknown primary. She presented with retroperitoneal adenopathy largest measuring 2.8 cm diagnosed in December 2016 after a CT-guided biopsy showed a poorly differentiated carcinoma. The primary malignancy has not been identified with a pathology indicating possibly pancreatic biliary etiology.   Current therapy: Observation and surveillance she declined systemic therapy.  Interim History: [name] presents today for a follow-up visit. Since the last visit, she reports no changes in her health. She continues to live independently and attends to activities of daily living. She does not drive but able to take care of of all other affairs. She does report chronic back pain which is no change since last visit. She remains very active despite her age and diagnosis. She denied any abdominal pain, distention or weight loss. Her appetite remains reasonable with weight relatively stable. She denied any GI or GU bleeding.  She does not report any headaches, blurry vision, or seizures. She does not report any fevers, chills or sweats. Does not report any cough, wheezing, hemoptysis or dyspnea on exertion. Does not report any chest pain, palpitation, orthopnea or leg edema. Does not report any nausea, vomiting or abdominal pain. Does not report any constipation, diarrhea, hematochezia or melena. She does not report any frequency urgency or hesitancy. She does not report any skeletal complaints. Remaining review of systems unremarkable.   Medications: I have reviewed the patient's current medications.  Current Outpatient Prescriptions  Medication Sig Dispense Refill  . acetaminophen (TYLENOL) 500 MG tablet Take 500 mg by mouth 2 (two) times daily with a meal.    . aspirin EC 81 MG tablet Take  81 mg by mouth 2 (two) times a week.    . Calcium Carbonate-Vitamin D (CALCIUM-D PO) Take 1 tablet by mouth 2 (two) times daily with a meal. Triple Strength    . clonazePAM (KLONOPIN) 0.5 MG tablet Take 1 tablet (0.5 mg total) by mouth 2 (two) times daily for essential tremor. 60 tablet 4  . docusate sodium (COLACE) 100 MG capsule Take 100 mg by mouth 2 (two) times daily with a meal. As needed for constipation    . DULoxetine (CYMBALTA) 30 MG capsule Take 30 mg by mouth daily.  12  . gabapentin (NEURONTIN) 300 MG capsule Take 2 capsules (600 mg total) by mouth 3 (three) times daily. Take one capsule in the morning, one in the afternoon and 2 in the evening 180 capsule 11  . levothyroxine (SYNTHROID, LEVOTHROID) 75 MCG tablet     . magnesium oxide (MAG-OX) 400 MG tablet Take 400 mg by mouth at bedtime.     . meloxicam (MOBIC) 15 MG tablet Take 7.5 mg by mouth 2 (two) times daily with a meal. 1/2 TAB IN AM    . Misc Natural Products (OSTEO BI-FLEX ADV JOINT SHIELD PO) Take 1 capsule by mouth daily.    . Multiple Vitamin (MULTIVITAMIN WITH MINERALS) TABS tablet Take 1 tablet by mouth daily.    . Omega-3 Fatty Acids (FISH OIL) 1000 MG CAPS Take 1,000 mg by mouth 2 (two) times daily with a meal.    . Polyethyl Glycol-Propyl Glycol (SYSTANE ULTRA OP) Place 1 drop into both eyes daily.    Vladimir Faster Glycol-Propyl Glycol (SYSTANE) 0.4-0.3 % GEL ophthalmic gel Place 1 application into both eyes at bedtime.    . primidone (MYSOLINE) 50 MG  tablet Take 2 tablets (100 mg total) by mouth at bedtime. 120 tablet 12  . rOPINIRole (REQUIP) 1 MG tablet Take 0.5 mg by mouth every evening. 3 hours before bedtime    . solifenacin (VESICARE) 10 MG tablet Take 10 mg by mouth daily.      No current facility-administered medications for this visit.      Allergies:  Allergies  Allergen Reactions  . Propranolol Itching and Rash    Rash All over   . Sulfa Antibiotics Rash    Past Medical History, Surgical history,  Social history, and Family History were reviewed and updated.   Physical Exam: Blood pressure (!) 170/66, pulse 63, temperature 97.9 F (36.6 C), temperature source Oral, resp. rate 17, weight 99 lb 14.4 oz (45.3 kg), SpO2 100 %. ECOG: 1 General appearance: Alert, awake woman without distress. Head: Normocephalic, without obvious abnormality no oral thrush noted. Neck: no adenopathy Lymph nodes: Cervical, supraclavicular, and axillary nodes normal. Heart:regular rate and rhythm, S1, S2 normal, no murmur, click, rub or gallop Lung:chest clear, no wheezing, rales, normal symmetric air entry Abdomin: soft, non-tender, without masses or organomegaly no rebound or guarding. EXT:no erythema, induration, or nodules Neurological examination: No deficits.  Lab Results: Lab Results  Component Value Date   WBC 4.6 12/24/2016   HGB 12.4 12/24/2016   HCT 37.9 12/24/2016   MCV 98.4 12/24/2016   PLT 195 12/24/2016     Chemistry      Component Value Date/Time   NA 137 07/30/2016 1420   K 4.6 07/30/2016 1420   CL 99 02/02/2015 1741   CO2 27 07/30/2016 1420   BUN 18.4 07/30/2016 1420   CREATININE 0.7 07/30/2016 1420      Component Value Date/Time   CALCIUM 9.8 07/30/2016 1420   ALKPHOS 83 07/30/2016 1420   AST 26 07/30/2016 1420   ALT 21 07/30/2016 1420   BILITOT <0.30 07/30/2016 1420       Impression and Plan:  83 year old woman with the following issues:  1. Metastatic carcinoma of unknown primary. It is unclear if this is a metastatic from a GI, GU or pancreatic biliary tract. Her set of disease include retroperitoneal adenopathy with the largest of which around 2.8 cm.   PET CT scan obtained on 01/21/2016 showed hypermetabolic lymphadenopathy with the largest lymph node measuring about 1.6 cm. There are questionable left supraclavicular lymph nodes as well. No solid tumor malignancy have been identified.  She does not report any clinical decline or major complaints since the  last visit. The plan is to continue with active surveillance at this time and repeat imaging studies as needed. I will ask her to come back for a follow-up in 4 months for clinical surveillance purposes.  2. Follow-up: Will be in 4 months for clinical visit.  Zola Button, MD 2/7/20181:48 PM

## 2016-12-24 NOTE — Telephone Encounter (Signed)
Appointments scheduled per, 12/25/16 los. Patient was given a copy of the AVS report and appointment schedule, per 12/25/16 los.

## 2017-03-04 DIAGNOSIS — R51 Headache: Secondary | ICD-10-CM | POA: Diagnosis not present

## 2017-03-04 DIAGNOSIS — W19XXXA Unspecified fall, initial encounter: Secondary | ICD-10-CM | POA: Diagnosis not present

## 2017-03-04 DIAGNOSIS — M545 Low back pain: Secondary | ICD-10-CM | POA: Diagnosis not present

## 2017-03-18 ENCOUNTER — Encounter: Payer: Self-pay | Admitting: Neurology

## 2017-03-18 ENCOUNTER — Ambulatory Visit (INDEPENDENT_AMBULATORY_CARE_PROVIDER_SITE_OTHER): Payer: PPO | Admitting: Neurology

## 2017-03-18 ENCOUNTER — Encounter (INDEPENDENT_AMBULATORY_CARE_PROVIDER_SITE_OTHER): Payer: Self-pay

## 2017-03-18 VITALS — BP 163/75 | HR 55 | Ht 60.0 in | Wt 97.8 lb

## 2017-03-18 DIAGNOSIS — M6281 Muscle weakness (generalized): Secondary | ICD-10-CM | POA: Diagnosis not present

## 2017-03-18 DIAGNOSIS — R269 Unspecified abnormalities of gait and mobility: Secondary | ICD-10-CM | POA: Diagnosis not present

## 2017-03-18 DIAGNOSIS — R2689 Other abnormalities of gait and mobility: Secondary | ICD-10-CM

## 2017-03-18 DIAGNOSIS — M545 Low back pain, unspecified: Secondary | ICD-10-CM

## 2017-03-18 DIAGNOSIS — W19XXXA Unspecified fall, initial encounter: Secondary | ICD-10-CM

## 2017-03-18 MED ORDER — CLONAZEPAM 0.5 MG PO TABS: ORAL_TABLET | ORAL | 4 refills | Status: DC

## 2017-03-18 MED ORDER — PRIMIDONE 50 MG PO TABS: 50.0000 mg | ORAL_TABLET | Freq: Every day | ORAL | 12 refills | Status: DC

## 2017-03-18 NOTE — Progress Notes (Signed)
[name]    Provider:  Dr Jaynee Eagles Referring Provider: [name], MD Primary Care Physician:  [name], MD  CC: Essential tremor  Interval History 03/18/2017: She is still on the primidone and neurontin. Last time discussed trying Klonopin at last appointment but she lost the prescription. She can't remember people's names. Brother had Alzheimer's and her sister had a cognitive disorder more with hallucinations unknown type. Family has had to help her with transactions and bills. She is not asking the same questions over and over, she is not getting lost, she is highly functional, she takes her own medications successfully. Suspect normal cognitive aging. She fell into the tub, unknown why she fell. She uses a cane. I did discuss the risks of Clonopin especially in the elderly, risk for falls, sedation, as well as dependency and withdrawal. Patient feels that the benefits outweigh the .She has not taken physical therapy as she can;t leave her home unattended. Will order physical therapy to her home.   Interval history: This is a lovely 83 year old female here for follow-up of essential tremor. We have tried multiple medications.Currently on 150mg  primidone, neurontin 300mg  tid. She is overworked and tired, doing a lot of yard work. No falls. Primidone makes her tired. She lives near her daughter now in [place] who is here with her today. We discussed multiple other options, she has had side effects to medications and a fall after taking propranolol. One option is not to take any more medications however patient feels that her mouth tremor is significantly disturbing and she would like to try something else. Mouth tremors can be the most refractory in this condition. At this point we could try Klonopin and decrease her primidone. I did discuss the risks of Clonopin especially in the elderly, risk for falls, sedation, as well as dependency and withdrawal. Patient  feels that the benefits outweigh the risks    Interval history 05/15/2016: 83 year old patient here for tremor. She is on 2 tabs of primidone at night. She is beginning to drool. Would increase to 3 or 4 tablets at night.She is having balance problems, I recommend physical therapy. She is moving to [employer]. When she stands up she often has to step back or side step. She is moving into a house out there close to where she was born, close to sister and daughter. Would go up on the primidone by one pill at night. Can also increase her gabapentin as this is also a medication that may help with essential tremor.   Interval history 01/16/2016: Sheis here for follow up on Tremor is worsening and now involving her chin worse. She is only on 1/2 pill of 50mg  Primidone. Will slowly increase as tolerated. Patient can increase to a pill at night for 1-2 weeks and then go up by 1/2 a pill every few weeks as tolerated, just be careful for sedation ans side effects and risk of falls especially if she gets up in the middle of the night. She is going through testing for possible cancer, she is going to have a PET scan soon. No more falls, not getting on the roof or cleaning anymore. She is still cleaning up leaves and getting in the year and taking care of her house.She has some difficulty relaxing her legs. But her tone feels good without spasticity or anything that appears pathologic. She has some stiffness in the legs and ankles and joints due to osteoarthritis. Advised if she is in the kitchen make sure  she has the spongy matts to help instead of standing on something hard, just ensure they are not slippery and well attached to the floor. She has some imbalance, advised her to use her cane and walker all the time. Also get up slowly. She has back pain and osteoarthritis as well contributing to her imbalance. She wears her back brace all the time when doing activities.    Interval history 07/19/2015: She was evaluated  by Dr. Rexene Alberts in May who confirmed essential tremor. The tremor is still there. She is still taking the primidone at night. Her legs hurt, they are stiff and her feet hurt. She denies any sensory changes, no burning or tingling. They are very stiff. She has some redness and discoloration on her feet. She feels stiffness and swelling in the lower extremity. At the end of the day they are swollen. No cramping in the feet. Movement of the toes hurts, she keeps saying stiffness. Denies tingling, burning. It hurts all the time. They don't keep her awake. She is taking the neurontin twice daily. She is tolerating the neurontin really well. Will increase it to three times a day. One am, one afternoon and 2 pm. Discussed with patient that the symptoms in her feet are more likely due to osteoarthritis then neuropathy. I do recommend she follow up with her primary care. Can increase Neurontin to 3 times a day and see if that helps. We'll continue on current dose of primidone due to sedation risk, also Neurontin is good for essential tremor so increasing that may help as well.   Interval history 03/07/2015: Dana Klein is a 83 y.o. female here as a follow up. She fell down the stairs. Broke her arm. She was hospitalized. Reviewed images of her brain that were done while she was in the hospital. She is taking 25mg  of the primidone. She will start taking the whole tablet.   MRI of the brain: personally reviewed images of the brain and agree with the following:  IMPRESSION: No acute brain infarction. The cerebellum does not show any focal insult.  Mild age related atrophy and chronic small vessel change, less than often seen in healthy individuals of this age.  Some inflammatory change of the maxillary sinuses. Small amount of fluid in the mastoid air cells left more than right.  HPI: Dana Klein is a 83 y.o. female here as a follow up.  She worked out in the yard and is having pain in the  anterior legs likely from overuse. She is a little slow, she hasn't been walking outside as much. Now that the weather is warm she can go back. Daughter denies hypophonia, changes in handwriting. She has hearing difficulty and usually talks too loud. Daughter feels expression is normal but family members have noticed the mild mouth tremor. Sinemet does not help her symptoms.   Emg/ncs 10/20: There is electrophysiologic evidence of a symmetric length-dependent severe axonal sensorimotor polyneuropathy. In addition, there is evidence of a moderately-severe right median nerve entrapment at the wrist. Decreased insertional activity of distal leg muscles could be due to atrophy. Clinical correlation suggested   Initial visit 08/29/2014:[name] patient who was previously seeing Dr. Janann Colonel and is now following up with me. She was referred for tremor and was diagnosed with PD and is on Sinemet 3 times a day. Took her pill at 8:15 am so it is wearing off now. Tremor significantly improves with the medication. Her handwriting improves with the medication as well. Doesn't  pay attention if she has any significant wearing off unless she is sitting reading but usually she is working outside and doesn't notice a tremor in fact was outside last night and just forgot to take her medication. No dyskinesias. Sometimes will fall over if she doesn't catch herself and "goes over". Using the cane when she goes out. At home doesn't use the cane. In the house she is fine, she can hold on and doesn't need it. Lives alone. Has a life alert necklace. Always wears it. Swallowing is fine, no choking. Sleeping well, uses requip for RLS. No rem sleep disorder. Not tired during the day.Notes some difficulty with short term memory, forgetting peoples names. Feels it is mild and not bothersome at this time  Had back surgery in 2010 and experiencing some low back pain and tightness in her leg muscles below the knees. She had a fracture.  Back hurts when she bends over. If she wears tennis shoes, feels like a vice around the feet. No bruning, no numbness, no tingling. Elevating the legs help.   Reviewed notes, labs and imaging from outside physicians, which showed: Initial visit 08/2013, tremor started a year previous both hands (unsure if started in one or other or both) and mostly at rest. Feels walking is slow, unsteady, trouble turning. Good sense of smell.   Has insomnia, is stable. Requip and Neurontin for RLS helps her sleep. Recent labs all unremarkable: cbc, crp, cmp, b12  Review of Systems: Patient complains of symptoms per HPI as well as the following symptoms: Eye itching, hearing loss, constipation, incontinence of bladder, bruising, numbness, tremors, aching muscles. Pertinent negatives per HPI. All others negative.  Social History   Social History  . Marital status: Widowed    Spouse name: N/A  . Number of children: 1  . Years of education: 62   Occupational History  .  Retired    Retired   Social History Main Topics  . Smoking status: Never Smoker  . Smokeless tobacco: Never Used  . Alcohol use No  . Drug use: No  . Sexual activity: Not on file   Other Topics Concern  . Not on file   Social History Narrative   Patient is a widow . Patient is retired and has a Dance movement psychotherapist.   Right handed.   Caffeine- One cup daily coffee.    Family History  Problem Relation Age of Onset  . Lung cancer Mother   . Colon cancer Mother   . Pneumonia Father   . Heart defect Father   . Diabetes Sister   . Diabetes Brother   . Diabetes Sister   . Diabetes Brother     Past Medical History:  Diagnosis Date  . Back pain   . Bladder incontinence   . Hearing loss   . Leg numbness   . Numbness of foot   . Osteoarthritis   . Thyroid disease     Past Surgical History:  Procedure Laterality Date  . ABDOMINAL HYSTERECTOMY    . BACK SURGERY    . CARPAL TUNNEL RELEASE    . Left Wrist Surgery       Current Outpatient Prescriptions  Medication Sig Dispense Refill  . acetaminophen (TYLENOL) 500 MG tablet Take 500 mg by mouth 2 (two) times daily with a meal.    . aspirin EC 81 MG tablet Take 81 mg by mouth 2 (two) times a week.    . Calcium Carbonate-Vitamin D (CALCIUM-D PO) Take 1  tablet by mouth 2 (two) times daily with a meal. Triple Strength    . docusate sodium (COLACE) 100 MG capsule Take 100 mg by mouth 2 (two) times daily with a meal. As needed for constipation    . DULoxetine (CYMBALTA) 30 MG capsule Take 30 mg by mouth daily.  12  . gabapentin (NEURONTIN) 300 MG capsule Take 2 capsules (600 mg total) by mouth 3 (three) times daily. Take one capsule in the morning, one in the afternoon and 2 in the evening 180 capsule 11  . levothyroxine (SYNTHROID, LEVOTHROID) 75 MCG tablet     . magnesium oxide (MAG-OX) 400 MG tablet Take 400 mg by mouth at bedtime.     . meloxicam (MOBIC) 15 MG tablet Take 7.5 mg by mouth 2 (two) times daily with a meal. 1/2 TAB IN AM    . Misc Natural Products (OSTEO BI-FLEX ADV JOINT SHIELD PO) Take 1 capsule by mouth daily.    . Multiple Vitamin (MULTIVITAMIN WITH MINERALS) TABS tablet Take 1 tablet by mouth daily.    . Omega-3 Fatty Acids (FISH OIL) 1000 MG CAPS Take 1,000 mg by mouth 2 (two) times daily with a meal.    . Polyethyl Glycol-Propyl Glycol (SYSTANE ULTRA OP) Place 1 drop into both eyes daily.    Vladimir Faster Glycol-Propyl Glycol (SYSTANE) 0.4-0.3 % GEL ophthalmic gel Place 1 application into both eyes at bedtime.    . primidone (MYSOLINE) 50 MG tablet Take 2 tablets (100 mg total) by mouth at bedtime. 120 tablet 12  . rOPINIRole (REQUIP) 1 MG tablet Take 0.5 mg by mouth every evening. 3 hours before bedtime    . solifenacin (VESICARE) 10 MG tablet Take 10 mg by mouth daily.     . clonazePAM (KLONOPIN) 0.5 MG tablet Take 1 tablet (0.5 mg total) by mouth 2 (two) times daily for essential tremor. 60 tablet 4   No current facility-administered  medications for this visit.     Allergies as of 03/18/2017 - Review Complete 03/18/2017  Allergen Reaction Noted  . Propranolol Itching and Rash 02/18/2015  . Sulfa antibiotics Rash 06/10/2011    Vitals: BP (!) 163/75   Pulse (!) 55   Ht 5' (1.524 m)   Wt 97 lb 12.8 oz (44.4 kg)   BMI 19.10 kg/m  Last Weight:  Wt Readings from Last 1 Encounters:  03/18/17 97 lb 12.8 oz (44.4 kg)   Last Height:   Ht Readings from Last 1 Encounters:  03/18/17 5' (1.524 m)   Speech:  Speech is normal; fluent and spontaneous with normal comprehension.  Cognition:  The patient is oriented to person, place, and time;   Cranial Nerves:  The pupils are equal, round, and reactive to light. Visual fields are full to finger confrontation. Extraocular movements are intact. Trigeminal sensation is intact and the muscles of mastication are normal. The face is symmetric. The palate elevates in the midline. Hearing intact. Voice is normal. Shoulder shrug is normal. The tongue has normal motion without fasciculations.   Coordination:  bradykinetic  Gait:  stooped, imbalance, bradykinetic, turns slowly, narrow but not shuffling.  Motor Observation:  No resting tremor. Bilateral upper extremity postural and action tremor in the hands. High-frequency low amplitude. Chin tremor.  Tone:  Normal muscle tone.   Strength:  Strength is 4/V in the upper and lower limbs.    Sensation: Decreased pinprick and temperature distally    Assessment/Plan: [age] year old woman presenting for follow up evaluation of history of  bilateral hand tremor and muscle cramping, neuropathy, imbalance, falls. Based on clinical history and physical exam her findings are most consistent with a diagnosis of essential tremor. Today she is also complaining of worsening gait, low back pain, weakness and falls in the home.  -  As far as your medications are concerned, I would like to suggest: Start  Klonopin for essential tremor.  did discuss the risks of Klonopin especially in the elderly, risk for falls, sedation, as well as dependency and withdrawal. Patient feels that the benefits outweigh the risks. Decrease primidone to 1 at night. - Stiffness in the feet likely arthritic as opposed to worsening of neuropathy. - Essential tremor: propranolol with rash and fall. On neurontin and primidone. Primidone makes her very tired. patient feels that her mouth tremor is significantly disturbing and she would like to try something else. Mouth tremors can be the most refractory in this condition. At this point we could try Klonopin and decrease her primidone. I did discuss the risks of Klonopin especially in the elderly, risk for falls, sedation, as well as dependency and withdrawal. Patient feels that the benefits outweigh the risks - continue Requip 1mg  qhs for RLS -check B12 level : normal - always use walking aid even in the house, fall precautions.  - Memory: monitor clinically - insomnia: good sleep hygiene - Neuropathy: Continue Neurontin dose which may also be helpful for essential tremor.  Physical Therapy: Patient needs home PT for worsening gait, low back pain, weakness and falls in the home.Also needs a home safety study to eval for fall risks.  Sarina Ill, MD  [facility] [address] [place], [place] [address]  Phone [phone] Fax [phone]  A total of 25 minutes was spent face-to-face with this patient. Over half this time was spent on counseling patient on the essential tremor, falls, gait abnormality, weakness, back pain diagnosis and different diagnostic and therapeutic options available.

## 2017-03-18 NOTE — Patient Instructions (Addendum)
Remember to drink plenty of fluid, eat healthy meals and do not skip any meals. Try to eat protein with a every meal and eat a healthy snack such as fruit or nuts in between meals. Try to keep a regular sleep-wake schedule and try to exercise daily, particularly in the form of walking, 20-30 minutes a day, if you can.   As far as your medications are concerned, I would like to suggest: Clonazepam 0.5mg  twice daily   I would like to see you back in 6-9 months, sooner if we need to. Please call us with any interim questions, concerns, problems, updates or refill requests.   Our phone number is [phone]. We also have an after hours call service for urgent matters and there is a physician on-call for urgent questions. For any emergencies you know to call 911 or go to the nearest emergency room  Clonazepam tablets What is this medicine? CLONAZEPAM (kloe NA ze pam) is a benzodiazepine. It is used to treat certain types of seizures. It is also used to treat panic disorder. This medicine may be used for other purposes; ask your health care provider or pharmacist if you have questions. COMMON BRAND NAME(S): Ceberclon, Klonopin What should I tell my health care provider before I take this medicine? They need to know if you have any of these conditions: -an alcohol or drug abuse problem -bipolar disorder, depression, psychosis or other mental health condition -glaucoma -kidney or liver disease -lung or breathing disease -myasthenia gravis -Parkinson's disease -porphyria -seizures or a history of seizures -suicidal thoughts -an unusual or allergic reaction to clonazepam, other benzodiazepines, foods, dyes, or preservatives -pregnant or trying to get pregnant -breast-feeding How should I use this medicine? Take this medicine by mouth with a glass of water. Follow the directions on the prescription label. If it upsets your stomach, take it with food or milk. Take your medicine at regular  intervals. Do not take it more often than directed. Do not stop taking or change the dose except on the advice of your doctor or health care professional. A special MedGuide will be given to you by the pharmacist with each prescription and refill. Be sure to read this information carefully each time. Talk to your pediatrician regarding the use of this medicine in children. Special care may be needed. Overdosage: If you think you have taken too much of this medicine contact a poison control center or emergency room at once. NOTE: This medicine is only for you. Do not share this medicine with others. What if I miss a dose? If you miss a dose, take it as soon as you can. If it is almost time for your next dose, take only that dose. Do not take double or extra doses. What may interact with this medicine? Do not take this medication with any of the following medicines: -narcotic medicines for cough -sodium oxybate This medicine may also interact with the following medications: -alcohol -antihistamines for allergy, cough and cold -antiviral medicines for HIV or AIDS -certain medicines for anxiety or sleep -certain medicines for depression, like amitriptyline, fluoxetine, sertraline -certain medicines for fungal infections like ketoconazole and itraconazole -certain medicines for seizures like carbamazepine, phenobarbital, phenytoin, primidone -general anesthetics like halothane, isoflurane, methoxyflurane, propofol -local anesthetics like lidocaine, pramoxine, tetracaine -medicines that relax muscles for surgery -narcotic medicines for pain -phenothiazines like chlorpromazine, mesoridazine, prochlorperazine, thioridazine This list may not describe all possible interactions. Give your health care provider a list of all the medicines, herbs, non-prescription drugs,  or dietary supplements you use. Also tell them if you smoke, drink alcohol, or use illegal drugs. Some items may interact with your  medicine. What should I watch for while using this medicine? Tell your doctor or health care professional if your symptoms do not start to get better or if they get worse. Do not stop taking except on your doctor's advice. You may develop a severe reaction. Your doctor will tell you how much medicine to take. You may get drowsy or dizzy. Do not drive, use machinery, or do anything that needs mental alertness until you know how this medicine affects you. To reduce the risk of dizzy and fainting spells, do not stand or sit up quickly, especially if you are an older patient. Alcohol may increase dizziness and drowsiness. Avoid alcoholic drinks. If you are taking another medicine that also causes drowsiness, you may have more side effects. Give your health care provider a list of all medicines you use. Your doctor will tell you how much medicine to take. Do not take more medicine than directed. Call emergency for help if you have problems breathing or unusual sleepiness. The use of this medicine may increase the chance of suicidal thoughts or actions. Pay special attention to how you are responding while on this medicine. Any worsening of mood, or thoughts of suicide or dying should be reported to your health care professional right away. What side effects may I notice from receiving this medicine? Side effects that you should report to your doctor or health care professional as soon as possible: -allergic reactions like skin rash, itching or hives, swelling of the face, lips, or tongue -breathing problems -confusion -loss of balance or coordination -signs and symptoms of low blood pressure like dizziness; feeling faint or lightheaded, falls; unusually weak or tired -suicidal thoughts or mood changes Side effects that usually do not require medical attention (report to your doctor or health care professional if they continue or are bothersome): -dizziness -headache -tiredness -upset stomach This list  may not describe all possible side effects. Call your doctor for medical advice about side effects. You may report side effects to FDA at 1-800-FDA-1088. Where should I keep my medicine? Keep out of the reach of children. This medicine can be abused. Keep your medicine in a safe place to protect it from theft. Do not share this medicine with anyone. Selling or giving away this medicine is dangerous and against the law. This medicine may cause accidental overdose and death if taken by other adults, children, or pets. Mix any unused medicine with a substance like cat litter or coffee grounds. Then throw the medicine away in a sealed container like a sealed bag or a coffee can with a lid. Do not use the medicine after the expiration date. Store at room temperature between 15 and 30 degrees C (59 and 86 degrees F). Protect from light. Keep container tightly closed. NOTE: This sheet is a summary. It may not cover all possible information. If you have questions about this medicine, talk to your doctor, pharmacist, or health care provider.  2018 Elsevier/Gold Standard (2016-04-11 18:46:32)

## 2017-03-21 DIAGNOSIS — M174 Other bilateral secondary osteoarthritis of knee: Secondary | ICD-10-CM | POA: Diagnosis not present

## 2017-03-21 DIAGNOSIS — R262 Difficulty in walking, not elsewhere classified: Secondary | ICD-10-CM | POA: Diagnosis not present

## 2017-03-21 DIAGNOSIS — Z5181 Encounter for therapeutic drug level monitoring: Secondary | ICD-10-CM | POA: Diagnosis not present

## 2017-03-21 DIAGNOSIS — R531 Weakness: Secondary | ICD-10-CM | POA: Diagnosis not present

## 2017-03-31 ENCOUNTER — Telehealth: Payer: Self-pay | Admitting: Neurology

## 2017-03-31 NOTE — Telephone Encounter (Signed)
Physical therapist from [facility] called to inform pt fell on this past Saturday because of missing a step. Right hip hurting. Pt taking tylenol for pain. Skin break on right elbow. Pt taking clonazePAM (KLONOPIN) 0.5 MG tablet with primidone (MYSOLINE) 50 MG tablet and this should not be done (per Vrurivi) she said pt is worried and is sleeping a lot.  Please call Vrurvi back @ [phone] if there are questions

## 2017-04-01 NOTE — Telephone Encounter (Signed)
Patient needs to stop the Clonazepam. She is just going to have to manage the tremor as it is. Fall risks and sedation were highly discussed with patient when we started this medication. Please call patient and ask her to stop the clonazepam. Unfortunately she will have to manage the tremor because the fall risk is too great with the medication. Thanks jen

## 2017-04-01 NOTE — Telephone Encounter (Signed)
Called pt/dtr and left VM mssg w/ instructions to stop clonezepam. Also called PT back to discuss further. Let Corlis Leak know that pt should d/c clonazepam as med side effects are more severe for pt than tremor.  She previously instructed pt to use ice to elbow for comfort and to go to ED if hip soreness developed into pain or difficulty walking. Advised that pt continue Tylenol ES [id number] mg up to 4x/day as needed. She's already on aspirin and meloxicam so discouraged use of additional NSAIDs. Vrurvi plans on seeing pt for visit again tomorrow.

## 2017-04-01 NOTE — Addendum Note (Signed)
Addended by: Monte Fantasia on: 04/01/2017 12:08 PM   Modules accepted: Orders

## 2017-04-08 DIAGNOSIS — Z1231 Encounter for screening mammogram for malignant neoplasm of breast: Secondary | ICD-10-CM | POA: Diagnosis not present

## 2017-04-08 DIAGNOSIS — Z6820 Body mass index (BMI) 20.0-20.9, adult: Secondary | ICD-10-CM | POA: Diagnosis not present

## 2017-04-08 DIAGNOSIS — Z01419 Encounter for gynecological examination (general) (routine) without abnormal findings: Secondary | ICD-10-CM | POA: Diagnosis not present

## 2017-04-22 ENCOUNTER — Other Ambulatory Visit: Payer: PPO

## 2017-04-22 ENCOUNTER — Ambulatory Visit: Payer: PPO | Admitting: Oncology

## 2017-04-23 DIAGNOSIS — D709 Neutropenia, unspecified: Secondary | ICD-10-CM | POA: Diagnosis not present

## 2017-04-23 DIAGNOSIS — Z0000 Encounter for general adult medical examination without abnormal findings: Secondary | ICD-10-CM | POA: Diagnosis not present

## 2017-04-23 DIAGNOSIS — Z888 Allergy status to other drugs, medicaments and biological substances status: Secondary | ICD-10-CM | POA: Diagnosis not present

## 2017-04-23 DIAGNOSIS — X58XXXA Exposure to other specified factors, initial encounter: Secondary | ICD-10-CM | POA: Diagnosis not present

## 2017-04-23 DIAGNOSIS — I10 Essential (primary) hypertension: Secondary | ICD-10-CM | POA: Diagnosis not present

## 2017-04-23 DIAGNOSIS — Z79899 Other long term (current) drug therapy: Secondary | ICD-10-CM | POA: Diagnosis not present

## 2017-04-23 DIAGNOSIS — Z882 Allergy status to sulfonamides status: Secondary | ICD-10-CM | POA: Diagnosis not present

## 2017-04-23 DIAGNOSIS — C801 Malignant (primary) neoplasm, unspecified: Secondary | ICD-10-CM | POA: Diagnosis not present

## 2017-04-23 DIAGNOSIS — E7800 Pure hypercholesterolemia, unspecified: Secondary | ICD-10-CM | POA: Diagnosis not present

## 2017-04-23 DIAGNOSIS — E039 Hypothyroidism, unspecified: Secondary | ICD-10-CM | POA: Diagnosis not present

## 2017-04-23 DIAGNOSIS — Z7982 Long term (current) use of aspirin: Secondary | ICD-10-CM | POA: Diagnosis not present

## 2017-04-23 DIAGNOSIS — S3210XA Unspecified fracture of sacrum, initial encounter for closed fracture: Secondary | ICD-10-CM | POA: Diagnosis not present

## 2017-04-23 DIAGNOSIS — C7B09 Secondary carcinoid tumors of other sites: Secondary | ICD-10-CM | POA: Diagnosis not present

## 2017-04-24 ENCOUNTER — Telehealth: Payer: Self-pay

## 2017-04-24 NOTE — Telephone Encounter (Signed)
Received faxed detail report from [facility]. Pt d/c'd from PT to home/self care on 04/14/17. Given to Dr. Jaynee Eagles for review.

## 2017-04-29 ENCOUNTER — Telehealth: Payer: Self-pay | Admitting: Oncology

## 2017-04-29 ENCOUNTER — Ambulatory Visit (HOSPITAL_BASED_OUTPATIENT_CLINIC_OR_DEPARTMENT_OTHER): Payer: PPO | Admitting: Oncology

## 2017-04-29 ENCOUNTER — Other Ambulatory Visit (HOSPITAL_BASED_OUTPATIENT_CLINIC_OR_DEPARTMENT_OTHER): Payer: PPO

## 2017-04-29 VITALS — BP 151/72 | HR 72 | Temp 98.7°F | Resp 18 | Ht 60.0 in | Wt 100.6 lb

## 2017-04-29 DIAGNOSIS — C801 Malignant (primary) neoplasm, unspecified: Secondary | ICD-10-CM | POA: Diagnosis not present

## 2017-04-29 DIAGNOSIS — R590 Localized enlarged lymph nodes: Secondary | ICD-10-CM

## 2017-04-29 LAB — COMPREHENSIVE METABOLIC PANEL
ALT: 11 U/L (ref 0–55)
ANION GAP: 6 meq/L (ref 3–11)
AST: 18 U/L (ref 5–34)
Albumin: 3.5 g/dL (ref 3.5–5.0)
Alkaline Phosphatase: 118 U/L (ref 40–150)
BUN: 23 mg/dL (ref 7.0–26.0)
CHLORIDE: 103 meq/L (ref 98–109)
CO2: 28 meq/L (ref 22–29)
Calcium: 10 mg/dL (ref 8.4–10.4)
Creatinine: 0.7 mg/dL (ref 0.6–1.1)
EGFR: 79 mL/min/{1.73_m2} — AB (ref >90)
Glucose: 95 mg/dl (ref 70–140)
Potassium: 4.4 mEq/L (ref 3.5–5.1)
Sodium: 137 mEq/L (ref 136–145)
Total Bilirubin: 0.27 mg/dL (ref 0.20–1.20)
Total Protein: 6.4 g/dL (ref 6.4–8.3)

## 2017-04-29 LAB — CBC WITH DIFFERENTIAL/PLATELET
BASO%: 0.5 % (ref 0.0–2.0)
Basophils Absolute: 0 10*3/uL (ref 0.0–0.1)
EOS%: 2.5 % (ref 0.0–7.0)
Eosinophils Absolute: 0.1 10*3/uL (ref 0.0–0.5)
HCT: 34.8 % (ref 34.8–46.6)
HGB: 11.4 g/dL — ABNORMAL LOW (ref 11.6–15.9)
LYMPH%: 26.1 % (ref 14.0–49.7)
MCH: 32.1 pg (ref 25.1–34.0)
MCHC: 32.8 g/dL (ref 31.5–36.0)
MCV: 98 fL (ref 79.5–101.0)
MONO#: 0.2 10*3/uL (ref 0.1–0.9)
MONO%: 5.2 % (ref 0.0–14.0)
NEUT#: 2.7 10*3/uL (ref 1.5–6.5)
NEUT%: 65.7 % (ref 38.4–76.8)
PLATELETS: 171 10*3/uL (ref 145–400)
RBC: 3.55 10*6/uL — AB (ref 3.70–5.45)
RDW: 13.7 % (ref 11.2–14.5)
WBC: 4 10*3/uL (ref 3.9–10.3)
lymph#: 1.1 10*3/uL (ref 0.9–3.3)

## 2017-04-29 NOTE — Progress Notes (Signed)
Hematology and Oncology Follow Up Visit  Dana Klein [id number] February 15, 1928 83 y.o. 04/29/2017 3:01 PM Dana Klein, MDRamachandran, Ajith, MD   Principle Diagnosis: 83 year old woman with metastatic carcinoma of unknown primary. She presented with retroperitoneal adenopathy largest measuring 2.8 cm diagnosed in December 2016 after a CT-guided biopsy showed a poorly differentiated carcinoma. The primary malignancy has not been identified with a pathology indicating possibly pancreatic biliary etiology.   Current therapy: Observation and surveillance.   Interim History: Dana Klein presents today for a follow-up visit. Since the last visit, she reports no recent complaints. She continues to live independently and attends to activities of daily living. She does not drive but able to take care of of all other affairs. She does report chronic back pain which is no change since last visit. She remains very active despite her age and diagnosis. She did report some slight abdominal distention although no severe pain. She denied any vomiting or change in her bowel habits. Appetite remains reasonable and her weight is stable.  She has been evaluated at [facility] for a second opinion and after repeat imaging studies as well as Induration sequencing studies in her tumor, it was recommended that she continues with observation and surveillance at this time.  She does not report any headaches, blurry vision, or seizures. She does not report any fevers, chills or sweats. Does not report any cough, wheezing, hemoptysis or dyspnea on exertion. Does not report any chest pain, palpitation, orthopnea or leg edema. Does not report any nausea, vomiting or abdominal pain. Does not report any constipation, diarrhea, hematochezia or melena. She does not report any frequency urgency or hesitancy. She does not report any skeletal complaints. Remaining review of systems unremarkable.    Medications: I have reviewed the patient's current medications.  Current Outpatient Prescriptions  Medication Sig Dispense Refill  . acetaminophen (TYLENOL) 500 MG tablet Take 500 mg by mouth 2 (two) times daily with a meal.    . aspirin EC 81 MG tablet Take 81 mg by mouth 2 (two) times a week.    . Calcium Carbonate-Vitamin D (CALCIUM-D PO) Take 1 tablet by mouth 2 (two) times daily with a meal. Triple Strength    . docusate sodium (COLACE) 100 MG capsule Take 100 mg by mouth 2 (two) times daily with a meal. As needed for constipation    . DULoxetine (CYMBALTA) 30 MG capsule Take 30 mg by mouth daily.  12  . gabapentin (NEURONTIN) 300 MG capsule Take 2 capsules (600 mg total) by mouth 3 (three) times daily. Take one capsule in the morning, one in the afternoon and 2 in the evening 180 capsule 11  . levothyroxine (SYNTHROID, LEVOTHROID) 75 MCG tablet     . magnesium oxide (MAG-OX) 400 MG tablet Take 400 mg by mouth at bedtime.     . meloxicam (MOBIC) 15 MG tablet Take 7.5 mg by mouth 2 (two) times daily with a meal. 1/2 TAB IN AM    . Misc Natural Products (OSTEO BI-FLEX ADV JOINT SHIELD PO) Take 1 capsule by mouth daily.    . Multiple Vitamin (MULTIVITAMIN WITH MINERALS) TABS tablet Take 1 tablet by mouth daily.    . Omega-3 Fatty Acids (FISH OIL) 1000 MG CAPS Take 1,000 mg by mouth 2 (two) times daily with a meal.    . Polyethyl Glycol-Propyl Glycol (SYSTANE ULTRA OP) Place 1 drop into both eyes daily.    Dana Klein Glycol-Propyl Glycol (SYSTANE) 0.4-0.3 % GEL  ophthalmic gel Place 1 application into both eyes at bedtime.    . primidone (MYSOLINE) 50 MG tablet Take 1 tablet (50 mg total) by mouth at bedtime. 30 tablet 12  . rOPINIRole (REQUIP) 1 MG tablet Take 0.5 mg by mouth every evening. 3 hours before bedtime    . solifenacin (VESICARE) 10 MG tablet Take 10 mg by mouth daily.      No current facility-administered medications for this visit.      Allergies:  Allergies  Allergen  Reactions  . Propranolol Itching and Rash    Rash All over   . Sulfa Antibiotics Rash    Past Medical History, Surgical history, Social history, and Family History were reviewed and updated.   Physical Exam: Blood pressure (!) 151/72, pulse 72, temperature 98.7 F (37.1 C), temperature source Oral, resp. rate 18, height 5' (1.524 m), weight 100 lb 9.6 oz (45.6 kg), SpO2 99 %. ECOG: 1 General appearance: Well-appearing woman appeared without distress. Head: Normocephalic, without obvious abnormality no oral ulcers or lesions. Neck: no adenopathy Lymph nodes: Cervical, supraclavicular, and axillary nodes normal. Heart:regular rate and rhythm, S1, S2 normal, no murmur, click, rub or gallop Lung:chest clear, no wheezing, rales, normal symmetric air entry Abdomin: soft, non-tender, without masses or organomegaly no rebound or guarding. EXT:no erythema, induration, or nodules Neurological examination: No deficits.  Lab Results: Lab Results  Component Value Date   WBC 4.0 04/29/2017   HGB 11.4 (L) 04/29/2017   HCT 34.8 04/29/2017   MCV 98.0 04/29/2017   PLT 171 04/29/2017     Chemistry      Component Value Date/Time   NA 137 04/29/2017 1425   K 4.4 04/29/2017 1425   CL 99 02/02/2015 1741   CO2 28 04/29/2017 1425   BUN 23.0 04/29/2017 1425   CREATININE 0.7 04/29/2017 1425      Component Value Date/Time   CALCIUM 10.0 04/29/2017 1425   ALKPHOS 118 04/29/2017 1425   AST 18 04/29/2017 1425   ALT 11 04/29/2017 1425   BILITOT 0.27 04/29/2017 1425     FoundationOne report: Microsatellite status and tumor mutation burden could not be determined. The following mutations were identified: FBXW7 R479P, KDM6A [id number], PPP2R1A [id number]       Impression and Plan:  83 year old woman with the following issues:  1. Metastatic carcinoma of unknown primary. It is unclear if this is a metastatic from a GI, GU or pancreatic biliary tract. Her set of disease include retroperitoneal  adenopathy with the largest of which around 2.8 cm.   PET CT scan obtained on 01/21/2016 showed hypermetabolic lymphadenopathy with the largest lymph node measuring about 1.6 cm. There are questionable left supraclavicular lymph nodes as well. No solid tumor malignancy have been identified.  Repeat PET CT scan obtained on 04/23/2017 showed very minimal progression of disease in the retroperitoneal adenopathy. Her Foundationone testing did show few mutations that could be acceptable in the future.  She was evaluated by Dr. Meredeth Ide who suggested continued observation and surveillance given her indolent course of disease and lack of symptoms.  This approach was reviewed with her again today and she is agreeable to continue. She is willing to consider chemotherapy if she develops symptomatic progression of her disease.  I'm happy to continue to provide care for her in the future but she might consider continued follow-up of [facility] at that time.  2. Abdominal fullness and dyspepsia: She does have follow up with gastroenterology and she will consider endoscopy at this  time.  3. Follow-up: Will be in 4 months for clinical visit.  [facility], MD 6/13/20183:01 PM

## 2017-04-29 NOTE — Telephone Encounter (Signed)
Appointments scheduled per, 04/29/17 los. Patient was given a copy of the AVS report and appointment schedule, per 04/29/17 los.

## 2017-04-30 DIAGNOSIS — E7800 Pure hypercholesterolemia, unspecified: Secondary | ICD-10-CM | POA: Diagnosis not present

## 2017-04-30 DIAGNOSIS — G20 Parkinson's disease: Secondary | ICD-10-CM | POA: Diagnosis not present

## 2017-04-30 DIAGNOSIS — Z23 Encounter for immunization: Secondary | ICD-10-CM | POA: Diagnosis not present

## 2017-04-30 DIAGNOSIS — E039 Hypothyroidism, unspecified: Secondary | ICD-10-CM | POA: Diagnosis not present

## 2017-04-30 DIAGNOSIS — C799 Secondary malignant neoplasm of unspecified site: Secondary | ICD-10-CM | POA: Diagnosis not present

## 2017-05-01 ENCOUNTER — Other Ambulatory Visit: Payer: Self-pay | Admitting: Internal Medicine

## 2017-05-01 DIAGNOSIS — K2100 Gastro-esophageal reflux disease with esophagitis, without bleeding: Secondary | ICD-10-CM

## 2017-05-01 DIAGNOSIS — C799 Secondary malignant neoplasm of unspecified site: Secondary | ICD-10-CM

## 2017-05-06 ENCOUNTER — Ambulatory Visit
Admission: RE | Admit: 2017-05-06 | Discharge: 2017-05-06 | Disposition: A | Discharge status: Home / Self Care | Payer: PPO | Source: Ambulatory Visit | Attending: Internal Medicine | Admitting: Internal Medicine

## 2017-05-06 ENCOUNTER — Other Ambulatory Visit: Payer: Self-pay | Admitting: Internal Medicine

## 2017-05-06 DIAGNOSIS — K2100 Gastro-esophageal reflux disease with esophagitis, without bleeding: Secondary | ICD-10-CM

## 2017-05-06 DIAGNOSIS — C799 Secondary malignant neoplasm of unspecified site: Secondary | ICD-10-CM

## 2017-05-06 DIAGNOSIS — K228 Other specified diseases of esophagus: Secondary | ICD-10-CM | POA: Diagnosis not present

## 2017-06-17 ENCOUNTER — Other Ambulatory Visit: Payer: Self-pay | Admitting: Neurology

## 2017-06-18 ENCOUNTER — Telehealth: Payer: Self-pay | Admitting: Neurology

## 2017-06-18 MED ORDER — PRIMIDONE 50 MG PO TABS: 50.0000 mg | ORAL_TABLET | Freq: Every day | ORAL | 11 refills | Status: DC

## 2017-06-18 NOTE — Telephone Encounter (Signed)
Pharmacy did not receive refill rx from appt in May. Re-sent/e-scribed to pharmacy.

## 2017-06-18 NOTE — Addendum Note (Signed)
Addended by: Monte Fantasia on: 06/18/2017 10:04 AM   Modules accepted: Orders

## 2017-06-18 NOTE — Telephone Encounter (Signed)
Marlowe Kays with [facility] called in reference to primidone (MYSOLINE) 50 MG tablet.  States she has no refills for the patient last fill date was March.  Please contact Marlowe Kays [phone].

## 2017-08-06 DIAGNOSIS — I10 Essential (primary) hypertension: Secondary | ICD-10-CM | POA: Diagnosis not present

## 2017-08-06 DIAGNOSIS — Z882 Allergy status to sulfonamides status: Secondary | ICD-10-CM | POA: Diagnosis not present

## 2017-08-06 DIAGNOSIS — Z7982 Long term (current) use of aspirin: Secondary | ICD-10-CM | POA: Diagnosis not present

## 2017-08-06 DIAGNOSIS — Z79899 Other long term (current) drug therapy: Secondary | ICD-10-CM | POA: Diagnosis not present

## 2017-08-06 DIAGNOSIS — D72810 Lymphocytopenia: Secondary | ICD-10-CM | POA: Diagnosis not present

## 2017-08-06 DIAGNOSIS — J320 Chronic maxillary sinusitis: Secondary | ICD-10-CM | POA: Diagnosis not present

## 2017-08-06 DIAGNOSIS — K869 Disease of pancreas, unspecified: Secondary | ICD-10-CM | POA: Diagnosis not present

## 2017-08-06 DIAGNOSIS — C801 Malignant (primary) neoplasm, unspecified: Secondary | ICD-10-CM | POA: Diagnosis not present

## 2017-08-06 DIAGNOSIS — C7B01 Secondary carcinoid tumors of distant lymph nodes: Secondary | ICD-10-CM | POA: Diagnosis not present

## 2017-08-06 DIAGNOSIS — Z888 Allergy status to other drugs, medicaments and biological substances status: Secondary | ICD-10-CM | POA: Diagnosis not present

## 2017-08-06 DIAGNOSIS — R590 Localized enlarged lymph nodes: Secondary | ICD-10-CM | POA: Diagnosis not present

## 2017-08-06 DIAGNOSIS — E039 Hypothyroidism, unspecified: Secondary | ICD-10-CM | POA: Diagnosis not present

## 2017-08-26 ENCOUNTER — Telehealth: Payer: Self-pay | Admitting: Oncology

## 2017-08-26 ENCOUNTER — Ambulatory Visit (HOSPITAL_BASED_OUTPATIENT_CLINIC_OR_DEPARTMENT_OTHER): Payer: PPO | Admitting: Oncology

## 2017-08-26 ENCOUNTER — Other Ambulatory Visit (HOSPITAL_BASED_OUTPATIENT_CLINIC_OR_DEPARTMENT_OTHER): Payer: PPO

## 2017-08-26 VITALS — BP 142/62 | HR 58 | Temp 98.0°F | Resp 17 | Ht 60.0 in | Wt 99.5 lb

## 2017-08-26 DIAGNOSIS — D590 Drug-induced autoimmune hemolytic anemia: Secondary | ICD-10-CM | POA: Diagnosis not present

## 2017-08-26 DIAGNOSIS — R590 Localized enlarged lymph nodes: Secondary | ICD-10-CM

## 2017-08-26 DIAGNOSIS — C801 Malignant (primary) neoplasm, unspecified: Secondary | ICD-10-CM | POA: Diagnosis not present

## 2017-08-26 DIAGNOSIS — Z23 Encounter for immunization: Secondary | ICD-10-CM | POA: Diagnosis not present

## 2017-08-26 LAB — COMPREHENSIVE METABOLIC PANEL
ALT: 17 U/L (ref 0–55)
ANION GAP: 7 meq/L (ref 3–11)
AST: 24 U/L (ref 5–34)
Albumin: 3.6 g/dL (ref 3.5–5.0)
Alkaline Phosphatase: 94 U/L (ref 40–150)
BUN: 20.8 mg/dL (ref 7.0–26.0)
CALCIUM: 10.1 mg/dL (ref 8.4–10.4)
CHLORIDE: 109 meq/L (ref 98–109)
CO2: 27 mEq/L (ref 22–29)
CREATININE: 0.7 mg/dL (ref 0.6–1.1)
Glucose: 93 mg/dl (ref 70–140)
POTASSIUM: 4.2 meq/L (ref 3.5–5.1)
Sodium: 142 mEq/L (ref 136–145)
Total Bilirubin: 0.33 mg/dL (ref 0.20–1.20)
Total Protein: 6.6 g/dL (ref 6.4–8.3)

## 2017-08-26 LAB — CBC WITH DIFFERENTIAL/PLATELET
BASO%: 1 % (ref 0.0–2.0)
BASOS ABS: 0 10*3/uL (ref 0.0–0.1)
EOS%: 2.9 % (ref 0.0–7.0)
Eosinophils Absolute: 0.1 10*3/uL (ref 0.0–0.5)
HEMATOCRIT: 34.3 % — AB (ref 34.8–46.6)
HGB: 11.2 g/dL — ABNORMAL LOW (ref 11.6–15.9)
LYMPH#: 0.7 10*3/uL — AB (ref 0.9–3.3)
LYMPH%: 21.4 % (ref 14.0–49.7)
MCH: 32 pg (ref 25.1–34.0)
MCHC: 32.6 g/dL (ref 31.5–36.0)
MCV: 98 fL (ref 79.5–101.0)
MONO#: 0.2 10*3/uL (ref 0.1–0.9)
MONO%: 7.2 % (ref 0.0–14.0)
NEUT#: 2.3 10*3/uL (ref 1.5–6.5)
NEUT%: 67.5 % (ref 38.4–76.8)
PLATELETS: 144 10*3/uL — AB (ref 145–400)
RBC: 3.5 10*6/uL — AB (ref 3.70–5.45)
RDW: 14.3 % (ref 11.2–14.5)
WBC: 3.4 10*3/uL — ABNORMAL LOW (ref 3.9–10.3)

## 2017-08-26 NOTE — Telephone Encounter (Signed)
Scheduled appt per 10/10 los - Gave patient AVS and calender per los. F/u in JAN 2019.

## 2017-08-26 NOTE — Progress Notes (Signed)
Hematology and Oncology Follow Up Visit  Dana Klein [id number] [birth year] 83 y.o. 08/26/2017 2:57 PM Dana Klein, MDRamachandran, Ajith, MD   Principle Diagnosis: 83 year old woman with metastatic carcinoma of unknown primary. She presented with retroperitoneal adenopathy largest measuring 2.8 cm diagnosed in December 2016 after a CT-guided biopsy showed a poorly differentiated carcinoma. The primary malignancy has not been identified with a pathology indicating possibly pancreatic biliary etiology.   Current therapy: Observation and surveillance.   Interim History: [name] presents today for a follow-up visit. Since the last visit, she reports no changes in her health. She denied any recent constitutional symptoms or weight loss. She denied any pelvic pain or discomfort. She continues to live independently and attends to activities of daily living. She denied any vomiting or change in her bowel habits. Appetite remains stable with unchanged weight.  She does not report any headaches, blurry vision, or seizures. She does not report any fevers, chills or sweats. Does not report any cough, wheezing, hemoptysis or dyspnea on exertion. Does not report any chest pain, palpitation, orthopnea or leg edema. Does not report any nausea, vomiting or abdominal pain. Does not report any constipation, diarrhea, hematochezia or melena. She does not report any frequency urgency or hesitancy. She does not report any skeletal complaints. Remaining review of systems unremarkable.   Medications: I have reviewed the patient's current medications.  Current Outpatient Prescriptions  Medication Sig Dispense Refill  . acetaminophen (TYLENOL) 500 MG tablet Take 500 mg by mouth 2 (two) times daily with a meal.    . aspirin EC 81 MG tablet Take 81 mg by mouth 2 (two) times a week.    . Calcium Carbonate-Vitamin D (CALCIUM-D PO) Take 1 tablet by mouth 2 (two) times daily with a meal. Triple Strength    .  docusate sodium (COLACE) 100 MG capsule Take 100 mg by mouth 2 (two) times daily with a meal. As needed for constipation    . DULoxetine (CYMBALTA) 30 MG capsule Take 30 mg by mouth daily.  12  . gabapentin (NEURONTIN) 300 MG capsule Take 2 capsules (600 mg total) by mouth 3 (three) times daily. Take one capsule in the morning, one in the afternoon and 2 in the evening 180 capsule 11  . levothyroxine (SYNTHROID, LEVOTHROID) 75 MCG tablet     . magnesium oxide (MAG-OX) 400 MG tablet Take 400 mg by mouth at bedtime.     . meloxicam (MOBIC) 15 MG tablet Take 7.5 mg by mouth 2 (two) times daily with a meal. 1/2 TAB IN AM    . Misc Natural Products (OSTEO BI-FLEX ADV JOINT SHIELD PO) Take 1 capsule by mouth daily.    . Multiple Vitamin (MULTIVITAMIN WITH MINERALS) TABS tablet Take 1 tablet by mouth daily.    . Omega-3 Fatty Acids (FISH OIL) 1000 MG CAPS Take 1,000 mg by mouth 2 (two) times daily with a meal.    . Polyethyl Klein-Propyl Klein (SYSTANE ULTRA OP) Place 1 drop into both eyes daily.    Dana Klein (SYSTANE) 0.4-0.3 % GEL ophthalmic gel Place 1 application into both eyes at bedtime.    . primidone (MYSOLINE) 50 MG tablet Take 1 tablet (50 mg total) by mouth at bedtime. 30 tablet 11  . rOPINIRole (REQUIP) 1 MG tablet Take 0.5 mg by mouth every evening. 3 hours before bedtime    . solifenacin (VESICARE) 10 MG tablet Take 10 mg by mouth daily.      No current facility-administered  medications for this visit.      Allergies:  Allergies  Allergen Reactions  . Propranolol Itching and Rash    Rash All over   . Sulfa Antibiotics Rash    Past Medical History, Surgical history, Social history, and Family History were reviewed and updated.   Physical Exam: Blood pressure (!) 142/62, pulse (!) 58, temperature 98 F (36.7 C), temperature source Oral, resp. rate 17, height 5' (1.524 m), weight 99 lb 8 oz (45.1 kg), SpO2 100 %. ECOG: 1 General appearance: Alert, awake  woman without distress. Head: Normocephalic, without obvious abnormality no oral thrush or ulcers. Neck: no adenopathy or masses. Lymph nodes: Cervical, supraclavicular, and axillary nodes normal. Heart:regular rate and rhythm, S1, S2 normal, no murmur, click, rub or gallop Lung:chest clear, no wheezing, rales, normal symmetric air entry Abdomin: soft, non-tender, without masses or organomegaly no shifting dullness or ascites. EXT:no erythema, induration, or nodules Neurological examination: No deficits.  Lab Results: Lab Results  Component Value Date   WBC 3.4 (L) 08/26/2017   HGB 11.2 (L) 08/26/2017   HCT 34.3 (L) 08/26/2017   MCV 98.0 08/26/2017   PLT 144 (L) 08/26/2017     Chemistry      Component Value Date/Time   NA 137 04/29/2017 1425   K 4.4 04/29/2017 1425   CL 99 02/02/2015 1741   CO2 28 04/29/2017 1425   BUN 23.0 04/29/2017 1425   CREATININE 0.7 04/29/2017 1425      Component Value Date/Time   CALCIUM 10.0 04/29/2017 1425   ALKPHOS 118 04/29/2017 1425   AST 18 04/29/2017 1425   ALT 11 04/29/2017 1425   BILITOT 0.27 04/29/2017 1425     FoundationOne report: Microsatellite status and tumor mutation burden could not be determined. The following mutations were identified: FBXW7 R479P, KDM6A [id number], PPP2R1A [id number]       Impression and Plan:  83 year old woman with the following issues:  1. Metastatic carcinoma of unknown primary. It is unclear if this is a metastatic from a GI, GU or pancreatic biliary tract. Her set of disease include retroperitoneal adenopathy with the largest of which around 2.8 cm.   PET CT scan obtained on 01/21/2016 showed hypermetabolic lymphadenopathy with the largest lymph node measuring about 1.6 cm. There are questionable left supraclavicular lymph nodes as well. No solid tumor malignancy have been identified.   PET CT scan obtained on 04/23/2017 showed very minimal progression of disease in the retroperitoneal adenopathy. Her  Foundationone testing did show few mutations that could be acceptable in the future.  PET scan obtained in September 2018 at [facility] continues to show stable disease.  She was evaluated by Dr. Meredeth Ide who suggested continued observation and surveillance given her indolent course of disease and lack of symptoms.  She continues to be asymptomatic from her cancer without any indication for treatment. She would like to continue follow-up at [facility] and with me as well. She feels that if she will require treatment in the future she will like to do it locally.  2. Abdominal fullness and dyspepsia: Appears to have resolved.  3. Follow-up: Will be in 3 months for clinical visit.  Zola Button, MD 10/10/20182:57 PM

## 2017-08-31 NOTE — Telephone Encounter (Signed)
Close Encounter 

## 2017-09-07 ENCOUNTER — Other Ambulatory Visit: Payer: Self-pay | Admitting: Neurology

## 2017-09-07 ENCOUNTER — Telehealth: Payer: Self-pay | Admitting: Neurology

## 2017-09-07 DIAGNOSIS — G250 Essential tremor: Secondary | ICD-10-CM

## 2017-09-07 NOTE — Telephone Encounter (Signed)
Pt calling for a refill of gabapentin (NEURONTIN) 300 MG capsule,  Please send to  [id number] - [place], [place] [phone] (Phone) [phone] (Fax)

## 2017-09-08 NOTE — Telephone Encounter (Signed)
Refill sent. Pt has upcoming appt on 09/23/17.

## 2017-09-23 ENCOUNTER — Ambulatory Visit: Payer: PPO | Admitting: Neurology

## 2017-10-29 DIAGNOSIS — D72810 Lymphocytopenia: Secondary | ICD-10-CM | POA: Diagnosis not present

## 2017-10-29 DIAGNOSIS — R590 Localized enlarged lymph nodes: Secondary | ICD-10-CM | POA: Diagnosis not present

## 2017-10-29 DIAGNOSIS — M7989 Other specified soft tissue disorders: Secondary | ICD-10-CM | POA: Diagnosis not present

## 2017-10-29 DIAGNOSIS — C801 Malignant (primary) neoplasm, unspecified: Secondary | ICD-10-CM | POA: Diagnosis not present

## 2017-10-29 DIAGNOSIS — E039 Hypothyroidism, unspecified: Secondary | ICD-10-CM | POA: Diagnosis not present

## 2017-10-29 DIAGNOSIS — D709 Neutropenia, unspecified: Secondary | ICD-10-CM | POA: Diagnosis not present

## 2017-10-29 DIAGNOSIS — C799 Secondary malignant neoplasm of unspecified site: Secondary | ICD-10-CM | POA: Diagnosis not present

## 2017-10-29 DIAGNOSIS — D539 Nutritional anemia, unspecified: Secondary | ICD-10-CM | POA: Diagnosis not present

## 2017-10-29 DIAGNOSIS — I82492 Acute embolism and thrombosis of other specified deep vein of left lower extremity: Secondary | ICD-10-CM | POA: Diagnosis not present

## 2017-10-29 DIAGNOSIS — I10 Essential (primary) hypertension: Secondary | ICD-10-CM | POA: Diagnosis not present

## 2017-10-29 DIAGNOSIS — I824Z2 Acute embolism and thrombosis of unspecified deep veins of left distal lower extremity: Secondary | ICD-10-CM | POA: Diagnosis not present

## 2017-11-17 DIAGNOSIS — I82409 Acute embolism and thrombosis of unspecified deep veins of unspecified lower extremity: Secondary | ICD-10-CM

## 2017-11-17 HISTORY — DX: Acute embolism and thrombosis of unspecified deep veins of unspecified lower extremity: I82.409

## 2017-11-20 DIAGNOSIS — Z86718 Personal history of other venous thrombosis and embolism: Secondary | ICD-10-CM

## 2017-11-24 ENCOUNTER — Ambulatory Visit: Payer: PPO | Admitting: Neurology

## 2017-11-24 ENCOUNTER — Encounter: Payer: Self-pay | Admitting: Neurology

## 2017-11-24 VITALS — BP 163/76 | HR 57

## 2017-11-24 DIAGNOSIS — G609 Hereditary and idiopathic neuropathy, unspecified: Secondary | ICD-10-CM

## 2017-11-24 DIAGNOSIS — G250 Essential tremor: Secondary | ICD-10-CM | POA: Diagnosis not present

## 2017-11-24 DIAGNOSIS — R609 Edema, unspecified: Secondary | ICD-10-CM | POA: Diagnosis not present

## 2017-11-24 MED ORDER — ROPINIROLE HCL 1 MG PO TABS: 0.5000 mg | ORAL_TABLET | Freq: Every evening | ORAL | 6 refills | Status: DC

## 2017-11-24 MED ORDER — PRIMIDONE 50 MG PO TABS: 50.0000 mg | ORAL_TABLET | Freq: Every day | ORAL | 11 refills | Status: DC

## 2017-11-24 MED ORDER — GABAPENTIN 300 MG PO CAPS: ORAL_CAPSULE | ORAL | 6 refills | Status: DC

## 2017-11-24 NOTE — Progress Notes (Signed)
[facility]    Provider:  Dr Jaynee Eagles Referring Provider: [name], MD Primary Care Physician:  [name], MD CC: Essential tremor  Interval history 11/24/2017. She has significant mouth tremor. She can still do everything she wants to do. The mouth tremor is what bothers her the most. She feels a lot of muscle pain. Feels some cramps inher fingers and legs. Her legs are swelling. She has some swelling in the right leg. She is on xarelto for a DVT from [place]. She follows up at [facility] this month. Gets worse with prolonged sitting. She fell in the kitchen because her bedroom show fell off and she hit her head on the edge of the door. She feels PT helped and this was just a mechanical fall. She is raking leaves and mobility is good. She still has some pain in the feet, she takes gabapentin as needed. 2 at bedtime and one morning and one afternoon every day. Aspercreme helps.   Interval History 03/18/2017: She is still on the primidone and neurontin. Last time discussed trying Klonopin at last appointment but she lost the prescription. She can't remember people's names. Brother had Alzheimer's and her sister had a cognitive disorder more with hallucinations unknown type. Family has had to help her with transactions and bills. She is not asking the same questions over and over, she is not getting lost, she is highly functional, she takes her own medications successfully. Suspect normal cognitive aging. She fell into the tub, unknown why she fell. She uses a cane. I did discuss the risks of Clonopin especially in the elderly, risk for falls, sedation, as well as dependency and withdrawal. Patient feels that the benefits outweigh the .She has not taken physical therapy as she can;t leave her home unattended. Will order physical therapy to her home.   Interval history: This is a lovely 84 year old female here for follow-up of essential tremor. We have tried multiple  medications.Currently on 150mg  primidone, neurontin 300mg  tid. She is overworked and tired, doing a lot of yard work. No falls. Primidone makes her tired. She lives near her daughter now in [place] who is here with her today. We discussed multiple other options, she has had side effects to medications and a fall after taking propranolol. One option is not to take any more medications however patient feels that her mouth tremor is significantly disturbing and she would like to try something else. Mouth tremors can be the most refractory in this condition. At this point we could try Klonopin and decrease her primidone. I did discuss the risks of Clonopin especially in the elderly, risk for falls, sedation, as well as dependency and withdrawal. Patient feels that the benefits outweigh the risks    Interval history 05/15/2016: 84 year old patient here for tremor. She is on 2 tabs of primidone at night. She is beginning to drool. Would increase to 3 or 4 tablets at night.She is having balance problems, I recommend physical therapy. She is moving to [employer]. When she stands up she often has to step back or side step. She is moving into a house out there close to where she was born, close to sister and daughter. Would go up on the primidone by one pill at night. Can also increase her gabapentin as this is also a medication that may help with essential tremor.   Interval history 01/16/2016: Sheis here for follow up on Tremor is worsening and now involving her chin worse. She is only on 1/2  pill of 50mg  Primidone. Will slowly increase as tolerated. Patient can increase to a pill at night for 1-2 weeks and then go up by 1/2 a pill every few weeks as tolerated, just be careful for sedation ans side effects and risk of falls especially if she gets up in the middle of the night. She is going through testing for possible cancer, she is going to have a PET scan soon. No more falls, not getting on the roof or cleaning  anymore. She is still cleaning up leaves and getting in the year and taking care of her house.She has some difficulty relaxing her legs. But her tone feels good without spasticity or anything that appears pathologic. She has some stiffness in the legs and ankles and joints due to osteoarthritis. Advised if she is in the kitchen make sure she has the spongy matts to help instead of standing on something hard, just ensure they are not slippery and well attached to the floor. She has some imbalance, advised her to use her cane and walker all the time. Also get up slowly. She has back pain and osteoarthritis as well contributing to her imbalance. She wears her back brace all the time when doing activities.    Interval history 07/19/2015: She was evaluated by Dr. Rexene Alberts in May who confirmed essential tremor. The tremor is still there. She is still taking the primidone at night. Her legs hurt, they are stiff and her feet hurt. She denies any sensory changes, no burning or tingling. They are very stiff. She has some redness and discoloration on her feet. She feels stiffness and swelling in the lower extremity. At the end of the day they are swollen. No cramping in the feet. Movement of the toes hurts, she keeps saying stiffness. Denies tingling, burning. It hurts all the time. They don't keep her awake. She is taking the neurontin twice daily. She is tolerating the neurontin really well. Will increase it to three times a day. One am, one afternoon and 2 pm. Discussed with patient that the symptoms in her feet are more likely due to osteoarthritis then neuropathy. I do recommend she follow up with her primary care. Can increase Neurontin to 3 times a day and see if that helps. We'll continue on current dose of primidone due to sedation risk, also Neurontin is good for essential tremor so increasing that may help as well.   Interval history 03/07/2015: Dana Klein is a 84 y.o. female here as a follow up. She  fell down the stairs. Broke her arm. She was hospitalized. Reviewed images of her brain that were done while she was in the hospital. She is taking 25mg  of the primidone. She will start taking the whole tablet.   MRI of the brain: personally reviewed images of the brain and agree with the following:  IMPRESSION: No acute brain infarction. The cerebellum does not show any focal insult.  Mild age related atrophy and chronic small vessel change, less than often seen in healthy individuals of this age.  Some inflammatory change of the maxillary sinuses. Small amount of fluid in the mastoid air cells left more than right.  HPI: Dana Klein is a 84 y.o. female here as a follow up.  She worked out in the yard and is having pain in the anterior legs likely from overuse. She is a little slow, she hasn't been walking outside as much. Now that the weather is warm she can go back. Daughter denies hypophonia,  changes in handwriting. She has hearing difficulty and usually talks too loud. Daughter feels expression is normal but family members have noticed the mild mouth tremor. Sinemet does not help her symptoms.   Emg/ncs 10/20: There is electrophysiologic evidence of a symmetric length-dependent severe axonal sensorimotor polyneuropathy. In addition, there is evidence of a moderately-severe right median nerve entrapment at the wrist. Decreased insertional activity of distal leg muscles could be due to atrophy. Clinical correlation suggested   Initial visit 08/29/2014:[name] patient who was previously seeing Dr. Janann Colonel and is now following up with me. She was referred for tremor and was diagnosed with PD and is on Sinemet 3 times a day. Took her pill at 8:15 am so it is wearing off now. Tremor significantly improves with the medication. Her handwriting improves with the medication as well. Doesn't pay attention if she has any significant wearing off unless she is sitting reading but usually  she is working outside and doesn't notice a tremor in fact was outside last night and just forgot to take her medication. No dyskinesias. Sometimes will fall over if she doesn't catch herself and "goes over". Using the cane when she goes out. At home doesn't use the cane. In the house she is fine, she can hold on and doesn't need it. Lives alone. Has a life alert necklace. Always wears it. Swallowing is fine, no choking. Sleeping well, uses requip for RLS. No rem sleep disorder. Not tired during the day.Notes some difficulty with short term memory, forgetting peoples names. Feels it is mild and not bothersome at this time  Had back surgery in 2010 and experiencing some low back pain and tightness in her leg muscles below the knees. She had a fracture. Back hurts when she bends over. If she wears tennis shoes, feels like a vice around the feet. No bruning, no numbness, no tingling. Elevating the legs help.   Reviewed notes, labs and imaging from outside physicians, which showed: Initial visit 08/2013, tremor started a year previous both hands (unsure if started in one or other or both) and mostly at rest. Feels walking is slow, unsteady, trouble turning. Good sense of smell.   Has insomnia, is stable. Requip and Neurontin for RLS helps her sleep. Recent labs all unremarkable: cbc, crp, cmp, b12     Review of Systems: Patient complains of symptoms per HPI as well as the following symptoms: Tremor. Pertinent negatives and positives per HPI. All others negative.   Social History   Socioeconomic History  . Marital status: Widowed    Spouse name: Not on file  . Number of children: 1  . Years of education: 37  . Highest education level: Not on file  Social Needs  . Financial resource strain: Not on file  . Food insecurity - worry: Not on file  . Food insecurity - inability: Not on file  . Transportation needs - medical: Not on file  . Transportation needs - non-medical: Not on file    Occupational History    Employer: RETIRED    Comment: Retired  Tobacco Use  . Smoking status: Never Smoker  . Smokeless tobacco: Never Used  Substance and Sexual Activity  . Alcohol use: No    Alcohol/week: 0.0 oz  . Drug use: No  . Sexual activity: Not on file  Other Topics Concern  . Not on file  Social History Narrative   Patient is a widow . Patient is retired and has a Dance movement psychotherapist.   Right handed.  Caffeine- One cup daily coffee.    Family History  Problem Relation Age of Onset  . Lung cancer Mother   . Colon cancer Mother   . Pneumonia Father   . Heart defect Father   . Diabetes Sister   . Diabetes Brother   . Diabetes Sister   . Diabetes Brother     Past Medical History:  Diagnosis Date  . Back pain   . Bladder incontinence   . Blood clot in vein 10/2017   L leg  . Hearing loss   . Leg numbness   . Numbness of foot   . Osteoarthritis   . Thyroid disease     Past Surgical History:  Procedure Laterality Date  . ABDOMINAL HYSTERECTOMY    . BACK SURGERY    . CARPAL TUNNEL RELEASE    . Left Wrist Surgery      Current Outpatient Medications  Medication Sig Dispense Refill  . acetaminophen (TYLENOL) 500 MG tablet Take 500 mg by mouth 2 (two) times daily with a meal.    . aspirin EC 81 MG tablet Take 81 mg by mouth 2 (two) times a week.    . Calcium Carbonate-Vitamin D (CALCIUM-D PO) Take 1 tablet by mouth 2 (two) times daily with a meal. Triple Strength    . DULoxetine (CYMBALTA) 30 MG capsule Take 30 mg by mouth daily.  12  . gabapentin (NEURONTIN) 300 MG capsule 1 CAP IN THE MORNING, 1 CAP IN AFTERNOON AND 2 CAPS IN EVENING-CAN INCREASE TO 2 CAPS 3X DAILY 180 capsule 6  . levothyroxine (SYNTHROID, LEVOTHROID) 75 MCG tablet Take 75 mcg by mouth daily before breakfast.     . magnesium oxide (MAG-OX) 400 MG tablet Take 400 mg by mouth at bedtime.     . meloxicam (MOBIC) 15 MG tablet Take 7.5 mg by mouth 2 (two) times daily with a meal. 1/2 TAB  IN AM    . Misc Natural Products (OSTEO BI-FLEX ADV JOINT SHIELD PO) Take 1 capsule by mouth daily.    . Multiple Vitamin (MULTIVITAMIN WITH MINERALS) TABS tablet Take 1 tablet by mouth daily.    . Omega-3 Fatty Acids (FISH OIL) 1000 MG CAPS Take 1,000 mg by mouth 2 (two) times daily with a meal.    . Polyethyl Glycol-Propyl Glycol (SYSTANE ULTRA OP) Place 1 drop into both eyes daily.    Vladimir Faster Glycol-Propyl Glycol (SYSTANE) 0.4-0.3 % GEL ophthalmic gel Place 1 application into both eyes at bedtime.    . primidone (MYSOLINE) 50 MG tablet Take 1 tablet (50 mg total) by mouth at bedtime. 30 tablet 11  . rivaroxaban (XARELTO) 20 MG TABS tablet Take 20 mg by mouth daily with supper.    Marland Kitchen rOPINIRole (REQUIP) 1 MG tablet Take 0.5 mg by mouth every evening. 3 hours before bedtime    . solifenacin (VESICARE) 10 MG tablet Take 10 mg by mouth daily.      No current facility-administered medications for this visit.     Allergies as of 11/24/2017 - Review Complete 11/24/2017  Allergen Reaction Noted  . Propranolol Itching and Rash 02/18/2015  . Sulfa antibiotics Rash 06/10/2011    Vitals: There were no vitals taken for this visit. Last Weight:  Wt Readings from Last 1 Encounters:  08/26/17 99 lb 8 oz (45.1 kg)   Last Height:   Ht Readings from Last 1 Encounters:  08/26/17 5' (1.524 m)    Gait:  stooped, imbalance, bradykinetic, turns slowly, narrow but  not shuffling.  Motor Observation:  No resting tremor. Bilateral upper extremity postural and action tremor in the hands. High-frequency low amplitude. Chin tremor.  Tone:  Normal muscle tone.   Strength:  Strength is 4/V in the upper and lower limbs.    Sensation: Decreased pinprick and temperature distally       Assessment/Plan: [age] year old woman presenting for follow up evaluation of history of bilateral hand tremor and muscle cramping, neuropathy, imbalance, falls. Based on clinical history and  physical exam her findings are most consistent with a diagnosis of essential tremor. Lat appointment complaining of worsening gait, low back pain, weakness and falls in the home, ordered PT to the home.   - Essential tremor: propranolol with rash and fall. On neurontin and primidone. Could not tolerate Klonopin. Primidone makes her very tired and so she only takes very little at night, cant tolerate increase. patient feels that her mouth tremor is the worse but overall she is very active and mobile and the hand tremor does not limit her. Mouth tremors can be the most refractory in this condition.   - continue Requip 1mg  qhs for RLS  - B12 level : normal  - always use walking aid even in the house, fall precautions.   - Memory: monitor clinically, stable no signs of dementia  - insomnia: Discussed good sleep hygiene again  - Neuropathy: Continue Neurontin dose which may also be helpful for essential tremor.  Physical Therapy: Sent to her home, she was evaluated and had a home inspection.  F/u one year  Sarina Ill, MD  [facility] [address] [place], [place] [address]  Phone [phone] Fax [phone]  A total of 15 minutes was spent face-to-face with this patient. Over half this time was spent on counseling patient on the tremor, neuropathy diagnosis and different diagnostic and therapeutic options available.

## 2017-11-24 NOTE — Patient Instructions (Signed)
Continue current medications F/u in one year or as needed

## 2017-11-24 NOTE — Progress Notes (Signed)
fo

## 2017-11-27 ENCOUNTER — Inpatient Hospital Stay: Payer: PPO | Attending: Oncology | Admitting: Oncology

## 2017-11-27 ENCOUNTER — Telehealth: Payer: Self-pay | Admitting: Oncology

## 2017-11-27 VITALS — BP 141/64 | HR 71 | Temp 99.4°F | Resp 17 | Wt 101.2 lb

## 2017-11-27 DIAGNOSIS — C801 Malignant (primary) neoplasm, unspecified: Secondary | ICD-10-CM | POA: Insufficient documentation

## 2017-11-27 DIAGNOSIS — I82402 Acute embolism and thrombosis of unspecified deep veins of left lower extremity: Secondary | ICD-10-CM | POA: Diagnosis not present

## 2017-11-27 DIAGNOSIS — R590 Localized enlarged lymph nodes: Secondary | ICD-10-CM

## 2017-11-27 DIAGNOSIS — R609 Edema, unspecified: Secondary | ICD-10-CM | POA: Diagnosis not present

## 2017-11-27 NOTE — Progress Notes (Signed)
Hematology and Oncology Follow Up Visit  Dana Klein [id number] 10-May-1928 84 y.o. 11/27/2017 2:48 PM Dana Klein, MDRamachandran, Ajith, MD   Principle Diagnosis: 84 year old woman with carcinoma of unknown primary. She found to have retroperitoneal adenopathy largest measuring 2.8 cm diagnosed in December 2016 after a CT-guided biopsy showed a poorly differentiated carcinoma.    Current therapy: Observation and surveillance.   Interim History: Dana Klein is here with her family for a follow-up visit. Since the last visit, she developed worsening lower extremity edema and was diagnosed with deep vein thrombosis on the left side.  She was prescribed Xarelto and has been on it for the last month.  She denies any complications related to this medication at this time.  She denies any bleeding complications.  He reports her swelling has improved on the left side.  She still have residual chronic edema on the right.  She does not report any changes in her health otherwise.  She remains active and continues to attend to activities of daily living.  She denies any weight loss or excessive fatigue.  Her quality of life remain unchanged.  She does not report any headaches, blurry vision, or seizures. She does not report any fevers, chills or sweats. Does not report any cough, wheezing, hemoptysis or dyspnea on exertion. Does not report any chest pain, palpitation, orthopnea or leg edema. Does not report any nausea, vomiting or abdominal pain. Does not report any constipation, diarrhea, hematochezia or melena. She does not report any frequency urgency or hesitancy.  Does not report any skin rashes or lesions.  There is no report any easy bruising, petechiae or lymphadenopathy.  She does not report any skeletal complaints. Remaining review of systems unremarkable.   Medications: I have reviewed the patient's current medications.  Current Outpatient Medications  Medication Sig Dispense Refill  .  acetaminophen (TYLENOL) 500 MG tablet Take 500 mg by mouth 2 (two) times daily with a meal.    . aspirin EC 81 MG tablet Take 81 mg by mouth 2 (two) times a week.    . Calcium Carbonate-Vitamin D (CALCIUM-D PO) Take 1 tablet by mouth 2 (two) times daily with a meal. Triple Strength    . DULoxetine (CYMBALTA) 30 MG capsule Take 30 mg by mouth daily.  12  . gabapentin (NEURONTIN) 300 MG capsule 1 CAP IN THE MORNING, 1 CAP IN AFTERNOON AND 2 CAPS IN EVENING-CAN INCREASE TO 2 CAPS 3X DAILY 180 capsule 6  . levothyroxine (SYNTHROID, LEVOTHROID) 75 MCG tablet Take 75 mcg by mouth daily before breakfast.     . magnesium oxide (MAG-OX) 400 MG tablet Take 400 mg by mouth at bedtime.     . meloxicam (MOBIC) 15 MG tablet Take 7.5 mg by mouth 2 (two) times daily with a meal. 1/2 TAB IN AM    . Misc Natural Products (OSTEO BI-FLEX ADV JOINT SHIELD PO) Take 1 capsule by mouth daily.    . Multiple Vitamin (MULTIVITAMIN WITH MINERALS) TABS tablet Take 1 tablet by mouth daily.    . Omega-3 Fatty Acids (FISH OIL) 1000 MG CAPS Take 1,000 mg by mouth 2 (two) times daily with a meal.    . Polyethyl Glycol-Propyl Glycol (SYSTANE ULTRA OP) Place 1 drop into both eyes daily.    Dana Klein Glycol-Propyl Glycol (SYSTANE) 0.4-0.3 % GEL ophthalmic gel Place 1 application into both eyes at bedtime.    . primidone (MYSOLINE) 50 MG tablet Take 1 tablet (50 mg total) by mouth at bedtime.  30 tablet 11  . rivaroxaban (XARELTO) 20 MG TABS tablet Take 20 mg by mouth daily with supper.    Marland Kitchen rOPINIRole (REQUIP) 1 MG tablet Take 0.5 tablets (0.5 mg total) by mouth every evening. 3 hours before bedtime 90 tablet 6  . solifenacin (VESICARE) 10 MG tablet Take 10 mg by mouth daily.      No current facility-administered medications for this visit.      Allergies:  Allergies  Allergen Reactions  . Propranolol Itching and Rash    Rash All over   . Sulfa Antibiotics Rash    Past Medical History, Surgical history, Social history,  and Family History were reviewed and updated.   Physical Exam: Blood pressure (!) 141/64, pulse 71, temperature 99.4 F (37.4 C), temperature source Oral, resp. rate 17, weight 101 lb 3.2 oz (45.9 kg), SpO2 98 %. ECOG: 1 General appearance: Well-appearing woman appeared comfortable. Head: Normocephalic, without obvious abnormality  Oral mucosa: No thrush or ulcers. Eyes: Pupils are equal and round reactive to light. Lymph nodes: Cervical, supraclavicular, and axillary nodes normal. Heart:regular rate and rhythm, S1, S2 normal, no murmur, click, rub or gallop Lung:chest clear, no wheezing, rales, normal symmetric air entry Abdomin: soft, non-tender, without masses or organomegaly  Skeletal exam: No joint effusion or tenderness.  Mild edema noted in the right leg compared to the left. Neurological examination: No deficits. Skin: No rashes or lesions.  Lab Results: Lab Results  Component Value Date   WBC 3.4 (L) 08/26/2017   HGB 11.2 (L) 08/26/2017   HCT 34.3 (L) 08/26/2017   MCV 98.0 08/26/2017   PLT 144 (L) 08/26/2017     Chemistry      Component Value Date/Time   NA 142 08/26/2017 1423   K 4.2 08/26/2017 1423   CL 99 02/02/2015 1741   CO2 27 08/26/2017 1423   BUN 20.8 08/26/2017 1423   CREATININE 0.7 08/26/2017 1423      Component Value Date/Time   CALCIUM 10.1 08/26/2017 1423   ALKPHOS 94 08/26/2017 1423   AST 24 08/26/2017 1423   ALT 17 08/26/2017 1423   BILITOT 0.33 08/26/2017 1423     [facility] report: Microsatellite status and tumor mutation burden could not be determined. The following mutations were identified: FBXW7 R479P, KDM6A [id number], [id number] [id number]       Impression and Plan:  84 year old woman with the following issues:  1. Metastatic carcinoma of unknown primary. This was diagnosed in December 2016 after presenting with retroperitoneal adenopathy with the largest of which around 2.8 cm.   She continues to be on active surveillance at this  time given her disease have been relatively stable and continues to be asymptomatic.  PET scan obtained in September 2018 at [facility] continues to show stable disease.  She is to follow with Dr. Meredeth Ide who recommended continued observation and surveillance.   The natural course of this disease was reviewed again as well as treatment options.  Given the fact that she does not have any symptoms and her tumor appears to have regressed slightly I recommend continued observation and surveillance versus systemic chemotherapy.  She is agreeable to proceed with this plan and she has a repeat PET scan scheduled in February 2019 at [facility].  2.  Left lower extremity deep vein thrombosis: Likely related to malignancy and currently on Xarelto.  I recommended that she continues this medication for at least 6 months unlikely indefinitely.  3. Follow-up: Will  be in 3 months for clinical visit.  15 minutes was spent face-to-face with the patient today.  More than 50% of the time was spent on counseling and coordination her care.   Zola Button, MD 1/11/20192:48 PM

## 2017-11-27 NOTE — Telephone Encounter (Signed)
Gave avs and calendar for april °

## 2017-12-03 DIAGNOSIS — E039 Hypothyroidism, unspecified: Secondary | ICD-10-CM | POA: Diagnosis not present

## 2017-12-03 DIAGNOSIS — E7800 Pure hypercholesterolemia, unspecified: Secondary | ICD-10-CM | POA: Diagnosis not present

## 2017-12-10 DIAGNOSIS — M150 Primary generalized (osteo)arthritis: Secondary | ICD-10-CM | POA: Diagnosis not present

## 2017-12-10 DIAGNOSIS — E7800 Pure hypercholesterolemia, unspecified: Secondary | ICD-10-CM | POA: Diagnosis not present

## 2017-12-10 DIAGNOSIS — I10 Essential (primary) hypertension: Secondary | ICD-10-CM | POA: Diagnosis not present

## 2017-12-10 DIAGNOSIS — E039 Hypothyroidism, unspecified: Secondary | ICD-10-CM | POA: Diagnosis not present

## 2017-12-24 DIAGNOSIS — C801 Malignant (primary) neoplasm, unspecified: Secondary | ICD-10-CM | POA: Diagnosis not present

## 2017-12-24 DIAGNOSIS — D539 Nutritional anemia, unspecified: Secondary | ICD-10-CM | POA: Diagnosis not present

## 2017-12-24 DIAGNOSIS — I10 Essential (primary) hypertension: Secondary | ICD-10-CM | POA: Diagnosis not present

## 2017-12-24 DIAGNOSIS — I824Z2 Acute embolism and thrombosis of unspecified deep veins of left distal lower extremity: Secondary | ICD-10-CM | POA: Diagnosis not present

## 2017-12-24 DIAGNOSIS — D709 Neutropenia, unspecified: Secondary | ICD-10-CM | POA: Diagnosis not present

## 2017-12-24 DIAGNOSIS — Z7901 Long term (current) use of anticoagulants: Secondary | ICD-10-CM | POA: Diagnosis not present

## 2017-12-24 DIAGNOSIS — C7989 Secondary malignant neoplasm of other specified sites: Secondary | ICD-10-CM | POA: Diagnosis not present

## 2017-12-24 DIAGNOSIS — M7989 Other specified soft tissue disorders: Secondary | ICD-10-CM | POA: Diagnosis not present

## 2017-12-24 DIAGNOSIS — E039 Hypothyroidism, unspecified: Secondary | ICD-10-CM | POA: Diagnosis not present

## 2017-12-24 DIAGNOSIS — C772 Secondary and unspecified malignant neoplasm of intra-abdominal lymph nodes: Secondary | ICD-10-CM | POA: Diagnosis not present

## 2018-02-24 ENCOUNTER — Inpatient Hospital Stay: Payer: PPO | Attending: Oncology | Admitting: Oncology

## 2018-02-24 ENCOUNTER — Telehealth: Payer: Self-pay | Admitting: Oncology

## 2018-02-24 VITALS — BP 149/58 | HR 67 | Temp 98.6°F | Resp 22 | Ht 60.0 in | Wt 107.6 lb

## 2018-02-24 DIAGNOSIS — Z7901 Long term (current) use of anticoagulants: Secondary | ICD-10-CM | POA: Insufficient documentation

## 2018-02-24 DIAGNOSIS — I82402 Acute embolism and thrombosis of unspecified deep veins of left lower extremity: Secondary | ICD-10-CM | POA: Diagnosis not present

## 2018-02-24 DIAGNOSIS — C801 Malignant (primary) neoplasm, unspecified: Secondary | ICD-10-CM | POA: Diagnosis not present

## 2018-02-24 DIAGNOSIS — R609 Edema, unspecified: Secondary | ICD-10-CM | POA: Diagnosis not present

## 2018-02-24 DIAGNOSIS — R590 Localized enlarged lymph nodes: Secondary | ICD-10-CM

## 2018-02-24 NOTE — Telephone Encounter (Signed)
Scheduled appt per 4/10 los - Gave patient AVS and calender per los.  

## 2018-02-24 NOTE — Progress Notes (Signed)
Hematology and Oncology Follow Up Visit  Dana Klein [id number] 07/08/1928 84 y.o. 02/24/2018 2:52 PM Dana Klein, MDRamachandran, Ajith, MD   Principle Diagnosis: 84 year old woman with carcinoma of unknown primary diagnosed in 2016.  She presented with retroperitoneal adenopathy.   Current therapy: Active surveillance.  Interim History: [name] is here for a follow-up.  She reports no major changes in her health since the last visit.  She remains active and attends activities of daily living.  She was able to work outside the last few days and has been sore because of it.  She does report lower extremity edema after being on her feet for an extended period of time.  She remains on Xarelto without any bleeding or clotting issues.  She denies any weight loss or appetite changes.  She is eating well and have gained weight.  She denies any back pain or shoulder pain or any other discomfort.  She does not report any headaches, blurry vision, or seizures. She does not report any fevers, chills or sweats. Does not report any cough, wheezing, hemoptysis. Does not report any chest pain, palpitation, orthopnea.. Does not report any nausea, vomiting or abdominal pain. Does not report any constipation, diarrhea, hematochezia or melena. She does not report any frequency urgency or hesitancy.  Does not report any skin rashes or lesions.  She denied petechiae or lymphadenopathy. Remaining review of systems is negative.   Medications: I have reviewed the patient's current medications.  Current Outpatient Medications  Medication Sig Dispense Refill  . acetaminophen (TYLENOL) 500 MG tablet Take 500 mg by mouth 2 (two) times daily with a meal.    . aspirin EC 81 MG tablet Take 81 mg by mouth 2 (two) times a week.    . Calcium Carbonate-Vitamin D (CALCIUM-D PO) Take 1 tablet by mouth 2 (two) times daily with a meal. Triple Strength    . DULoxetine (CYMBALTA) 30 MG capsule Take 30 mg by mouth daily.   12  . gabapentin (NEURONTIN) 300 MG capsule 1 CAP IN THE MORNING, 1 CAP IN AFTERNOON AND 2 CAPS IN EVENING-CAN INCREASE TO 2 CAPS 3X DAILY 180 capsule 6  . levothyroxine (SYNTHROID, LEVOTHROID) 75 MCG tablet Take 75 mcg by mouth daily before breakfast.     . magnesium oxide (MAG-OX) 400 MG tablet Take 400 mg by mouth at bedtime.     . meloxicam (MOBIC) 15 MG tablet Take 7.5 mg by mouth 2 (two) times daily with a meal. 1/2 TAB IN AM    . Misc Natural Products (OSTEO BI-FLEX ADV JOINT SHIELD PO) Take 1 capsule by mouth daily.    . Multiple Vitamin (MULTIVITAMIN WITH MINERALS) TABS tablet Take 1 tablet by mouth daily.    . Omega-3 Fatty Acids (FISH OIL) 1000 MG CAPS Take 1,000 mg by mouth 2 (two) times daily with a meal.    . Polyethyl Glycol-Propyl Glycol (SYSTANE ULTRA OP) Place 1 drop into both eyes daily.    Vladimir Faster Glycol-Propyl Glycol (SYSTANE) 0.4-0.3 % GEL ophthalmic gel Place 1 application into both eyes at bedtime.    . primidone (MYSOLINE) 50 MG tablet Take 1 tablet (50 mg total) by mouth at bedtime. 30 tablet 11  . rivaroxaban (XARELTO) 20 MG TABS tablet Take 20 mg by mouth daily with supper.    Marland Kitchen rOPINIRole (REQUIP) 1 MG tablet Take 0.5 tablets (0.5 mg total) by mouth every evening. 3 hours before bedtime 90 tablet 6  . solifenacin (VESICARE) 10 MG tablet Take  10 mg by mouth daily.      No current facility-administered medications for this visit.      Allergies:  Allergies  Allergen Reactions  . Propranolol Itching and Rash    Rash All over   . Sulfa Antibiotics Rash    Past Medical History, Surgical history, Social history, and Family History were reviewed and updated.   Physical Exam: Blood pressure (!) 149/58, pulse 67, temperature 98.6 F (37 C), temperature source Oral, resp. rate (!) 22, height 5' (1.524 m), weight 107 lb 9.6 oz (48.8 kg), SpO2 98 %. ECOG: 1 General appearance: Alert, awake woman without distress. Head: Atraumatic without abnormalities. Oral  mucosa: Mucosa is moist and pink. Eyes: Sclera anicteric. Lymph nodes: No lymphadenopathy palpated on today's exam in the cervical, axillary or supraclavicular regions. Heart:Regular rate and rhythm without any murmurs or gallops. Lung: Clear without any rhonchi, wheezes or dullness to percussion. Abdomin: Soft, nontender without any rebound or guarding. Skeletal exam: Full range of motion in all joints.  No effusion or deformity. Neurological examination: No motor or sensory deficits. Skin: No petechia or ecchymosis.  Lab Results: Lab Results  Component Value Date   WBC 3.4 (L) 08/26/2017   HGB 11.2 (L) 08/26/2017   HCT 34.3 (L) 08/26/2017   MCV 98.0 08/26/2017   PLT 144 (L) 08/26/2017     Chemistry      Component Value Date/Time   NA 142 08/26/2017 1423   K 4.2 08/26/2017 1423   CL 99 02/02/2015 1741   CO2 27 08/26/2017 1423   BUN 20.8 08/26/2017 1423   CREATININE 0.7 08/26/2017 1423      Component Value Date/Time   CALCIUM 10.1 08/26/2017 1423   ALKPHOS 94 08/26/2017 1423   AST 24 08/26/2017 1423   ALT 17 08/26/2017 1423   BILITOT 0.33 08/26/2017 1423     [facility] report: Microsatellite status and tumor mutation burden could not be determined. The following mutations were identified: FBXW7 [id number], KDM6A [id number], [id number] [id number]       Impression and Plan:  84 year old woman with:   1. Carcinoma of unknown primary with diffuse lymphadenopathy diagnosed in December 2016.  She has not required any treatment since that time of diagnosis and she remains asymptomatic.  Last imaging studies obtained February 2019 at [facility] and showed no changes in the appearance or the hypermetabolic nature of her retroperitoneal or nodal adenopathy.  There are no new sites of metastasis.  These findings indicate a low-grade malignancy that has not changed over the last few years.  I have recommended continued active surveillance at this time she  continues to follow-up at [facility] as well.  2.  Deep vein thrombosis of the left lower extremity: She remains on Xarelto without any complications.  I favor long-term anticoagulation at this time.  3. Follow-up: Will be in 6 months.  15 minutes was spent face-to-face with the patient today.  More than 50% of the time was spent on counseling, education and answering questions regarding her future plan of care.  Zola Button, MD 4/10/20192:52 PM

## 2018-03-22 ENCOUNTER — Other Ambulatory Visit: Payer: Self-pay

## 2018-03-22 ENCOUNTER — Encounter (HOSPITAL_COMMUNITY): Payer: Self-pay

## 2018-03-22 ENCOUNTER — Emergency Department (HOSPITAL_COMMUNITY): Payer: PPO

## 2018-03-22 ENCOUNTER — Emergency Department (HOSPITAL_COMMUNITY)
Admission: EM | Admit: 2018-03-22 | Discharge: 2018-03-23 | Disposition: A | Discharge status: Home / Self Care | Payer: PPO | Attending: Emergency Medicine | Admitting: Emergency Medicine

## 2018-03-22 DIAGNOSIS — Z7901 Long term (current) use of anticoagulants: Secondary | ICD-10-CM | POA: Insufficient documentation

## 2018-03-22 DIAGNOSIS — C799 Secondary malignant neoplasm of unspecified site: Secondary | ICD-10-CM | POA: Diagnosis not present

## 2018-03-22 DIAGNOSIS — K5900 Constipation, unspecified: Secondary | ICD-10-CM | POA: Insufficient documentation

## 2018-03-22 DIAGNOSIS — R140 Abdominal distension (gaseous): Secondary | ICD-10-CM | POA: Diagnosis not present

## 2018-03-22 DIAGNOSIS — R109 Unspecified abdominal pain: Secondary | ICD-10-CM | POA: Diagnosis not present

## 2018-03-22 DIAGNOSIS — R1084 Generalized abdominal pain: Secondary | ICD-10-CM | POA: Diagnosis not present

## 2018-03-22 DIAGNOSIS — K56600 Partial intestinal obstruction, unspecified as to cause: Secondary | ICD-10-CM | POA: Diagnosis not present

## 2018-03-22 DIAGNOSIS — Z79899 Other long term (current) drug therapy: Secondary | ICD-10-CM | POA: Diagnosis not present

## 2018-03-22 LAB — COMPREHENSIVE METABOLIC PANEL
ALT: 23 U/L (ref 14–54)
AST: 22 U/L (ref 15–41)
Albumin: 3.9 g/dL (ref 3.5–5.0)
Alkaline Phosphatase: 66 U/L (ref 38–126)
Anion gap: 7 (ref 5–15)
BUN: 19 mg/dL (ref 6–20)
CALCIUM: 9.8 mg/dL (ref 8.9–10.3)
CO2: 26 mmol/L (ref 22–32)
CREATININE: 0.61 mg/dL (ref 0.44–1.00)
Chloride: 105 mmol/L (ref 101–111)
GFR calc Af Amer: >60 mL/min (ref >60)
Glucose, Bld: 93 mg/dL (ref 65–99)
Potassium: 4.5 mmol/L (ref 3.5–5.1)
SODIUM: 138 mmol/L (ref 135–145)
TOTAL PROTEIN: 6.9 g/dL (ref 6.5–8.1)
Total Bilirubin: 0.5 mg/dL (ref 0.3–1.2)

## 2018-03-22 LAB — URINALYSIS, ROUTINE W REFLEX MICROSCOPIC
Bacteria, UA: NONE SEEN
Bilirubin Urine: NEGATIVE
GLUCOSE, UA: NEGATIVE mg/dL
Hgb urine dipstick: NEGATIVE
Ketones, ur: NEGATIVE mg/dL
Nitrite: NEGATIVE
PH: 6 (ref 5.0–8.0)
Protein, ur: NEGATIVE mg/dL
Specific Gravity, Urine: 1.018 (ref 1.005–1.030)

## 2018-03-22 LAB — APTT: aPTT: 31 seconds (ref 24–36)

## 2018-03-22 LAB — CBC WITH DIFFERENTIAL/PLATELET
BASOS ABS: 0 10*3/uL (ref 0.0–0.1)
Basophils Relative: 0 %
EOS ABS: 0 10*3/uL (ref 0.0–0.7)
Eosinophils Relative: 1 %
HCT: 36.2 % (ref 36.0–46.0)
Hemoglobin: 12 g/dL (ref 12.0–15.0)
LYMPHS PCT: 21 %
Lymphs Abs: 0.7 10*3/uL (ref 0.7–4.0)
MCH: 32.4 pg (ref 26.0–34.0)
MCHC: 33.1 g/dL (ref 30.0–36.0)
MCV: 97.8 fL (ref 78.0–100.0)
MONO ABS: 0.2 10*3/uL (ref 0.1–1.0)
Monocytes Relative: 7 %
Neutro Abs: 2.6 10*3/uL (ref 1.7–7.7)
Neutrophils Relative %: 71 %
Platelets: 220 10*3/uL (ref 150–400)
RBC: 3.7 MIL/uL — ABNORMAL LOW (ref 3.87–5.11)
RDW: 14.1 % (ref 11.5–15.5)
WBC: 3.6 10*3/uL — AB (ref 4.0–10.5)

## 2018-03-22 LAB — PROTIME-INR
INR: 1.04
PROTHROMBIN TIME: 13.5 s (ref 11.4–15.2)

## 2018-03-22 LAB — I-STAT CG4 LACTIC ACID, ED
Lactic Acid, Venous: 0.44 mmol/L — ABNORMAL LOW (ref 0.5–1.9)
Lactic Acid, Venous: 0.41 mmol/L — ABNORMAL LOW (ref 0.5–1.9)

## 2018-03-22 MED ORDER — IOPAMIDOL (ISOVUE-300) INJECTION 61%
INTRAVENOUS | Status: AC
  Filled 2018-03-22: qty 100

## 2018-03-22 MED ORDER — IOPAMIDOL (ISOVUE-300) INJECTION 61%
100.0000 mL | Freq: Once | INTRAVENOUS | Status: AC | PRN
  Administered 2018-03-22: 100 mL via INTRAVENOUS

## 2018-03-22 NOTE — ED Notes (Signed)
Bed: WA24 Expected date:  Expected time:  Means of arrival:  Comments: EMS-abdominal pain 

## 2018-03-22 NOTE — ED Triage Notes (Signed)
Pt arrived from MD office. Pt reports that she has been having generalized abdominal pain since Friday with increased Abdominal distention. Pt denies n/v/d.Pt reports that she has been having BM but small amounts.   BP 128/94 HR 68 RR 18 99% RA

## 2018-03-22 NOTE — ED Provider Notes (Signed)
[facility] DEPT Provider Note   CSN: [id number] Arrival date & time: 03/22/18  1730     History   Chief Complaint Chief Complaint  Patient presents with  . Abdominal Pain    HPI Dana Klein is a 84 y.o. female.  Patient presents from her PCP office with complaint of abdominal pain with increasing distention for the last 3-4 days. Pain was worse two days ago but has since improved. She reports pain in the epigastric area. She denies nausea and vomiting. She endorses mild constipation. Patient with history of metastatic malignancy of unknown primary origin with abdominal lymph node involvement. Plain films obtained in office today concerning for small bowel obstruction.   The history is provided by the patient and medical records. No language interpreter was used.  Abdominal Pain   This is a new problem. The current episode started more than 2 days ago. The problem occurs daily. The problem has been gradually improving. The pain is located in the epigastric region. The pain is mild. Pertinent negatives include fever, diarrhea, nausea and vomiting. Past workup includes GI consult and CT scan.    Past Medical History:  Diagnosis Date  . Back pain   . Bladder incontinence   . DVT (deep venous thrombosis) ([place]) 11/2017  . Hearing loss   . Leg numbness   . Numbness of foot   . Osteoarthritis   . Thyroid disease     Patient Active Problem List   Diagnosis Date Noted  . Lymphadenopathy, abdominal   . Arthritis 07/22/2015  . Essential tremor 07/22/2015  . Tremor 03/08/2015  . Hereditary and idiopathic peripheral neuropathy 03/08/2015  . Back problem 06/10/2011  . Osteoarthritis 06/10/2011  . Hearing loss 06/10/2011  . Bladder incontinence 06/10/2011    Past Surgical History:  Procedure Laterality Date  . ABDOMINAL HYSTERECTOMY    . BACK SURGERY    . CARPAL TUNNEL RELEASE    . Left Wrist Surgery       OB History   None      Home  Medications    Prior to Admission medications   Medication Sig Start Date End Date Taking? Authorizing Provider  acetaminophen (TYLENOL) 500 MG tablet Take 500 mg by mouth 2 (two) times daily with a meal.   Yes [provider]  Calcium Carbonate-Vitamin D (CALCIUM-D PO) Take 1 tablet by mouth 2 (two) times daily with a meal. Triple Strength   Yes [provider]  docusate sodium (COLACE) 100 MG capsule Take 100 mg by mouth 2 (two) times daily.   Yes [provider]  DULoxetine (CYMBALTA) 30 MG capsule Take 30 mg by mouth daily. 12/20/14  Yes [provider]  gabapentin (NEURONTIN) 300 MG capsule 1 CAP IN THE MORNING, 1 CAP IN AFTERNOON AND 2 CAPS IN EVENING-CAN INCREASE TO 2 CAPS 3X DAILY Patient taking differently: 1 CAP IN THE MORNING, 1 CAP IN AFTERNOON AND 2 CAPS IN EVENING 11/24/17  Yes Melvenia Beam, MD  levothyroxine (SYNTHROID, LEVOTHROID) 75 MCG tablet Take 75 mcg by mouth daily before breakfast.  12/07/16  Yes [provider]  magnesium oxide (MAG-OX) 400 MG tablet Take 400 mg by mouth at bedtime.    Yes [provider]  Misc Natural Products (OSTEO BI-FLEX ADV JOINT SHIELD PO) Take 1 capsule by mouth 2 (two) times daily.    Yes [provider]  Multiple Vitamin (MULTIVITAMIN WITH MINERALS) TABS tablet Take 1 tablet by mouth daily.  Yes [provider]  Omega-3 Fatty Acids (FISH OIL) 1000 MG CAPS Take 1,000 mg by mouth 2 (two) times daily with a meal.   Yes [provider]  Polyethyl Glycol-Propyl Glycol (SYSTANE ULTRA OP) Place 1 drop into both eyes daily.   Yes [provider]  Polyethyl Glycol-Propyl Glycol (SYSTANE) 0.4-0.3 % GEL ophthalmic gel Place 1 application into both eyes at bedtime.   Yes [provider]  primidone (MYSOLINE) 50 MG tablet Take 1 tablet (50 mg total) by mouth at bedtime. 11/24/17  Yes Melvenia Beam, MD  rivaroxaban (XARELTO) 20 MG TABS tablet Take 20 mg by mouth  daily with supper.   Yes [provider]  rOPINIRole (REQUIP) 1 MG tablet Take 0.5 tablets (0.5 mg total) by mouth every evening. 3 hours before bedtime 11/24/17  Yes Melvenia Beam, MD  solifenacin (VESICARE) 10 MG tablet Take 10 mg by mouth daily.    Yes [provider]    Family History Family History  Problem Relation Age of Onset  . Lung cancer Mother   . Colon cancer Mother   . Pneumonia Father   . Heart defect Father   . Diabetes Sister   . Diabetes Brother   . Diabetes Sister   . Diabetes Brother     Social History Social History   Tobacco Use  . Smoking status: Never Smoker  . Smokeless tobacco: Never Used  Substance Use Topics  . Alcohol use: No    Alcohol/week: 0.0 oz  . Drug use: No     Allergies   Meloxicam; Propranolol; and Sulfa antibiotics   Review of Systems Review of Systems  Constitutional: Negative for fatigue and fever.  Gastrointestinal: Positive for abdominal pain. Negative for diarrhea, nausea and vomiting.  All other systems reviewed and are negative.    Physical Exam Updated Vital Signs There were no vitals taken for this visit.  Physical Exam  Constitutional: She is oriented to person, place, and time. She appears well-developed and well-nourished. She does not appear ill.  HENT:  Head: Normocephalic.  Eyes: Conjunctivae are normal.  Neck: Normal range of motion. Neck supple.  Cardiovascular: Normal rate and regular rhythm.  Pulmonary/Chest: Effort normal and breath sounds normal.  Abdominal: Bowel sounds are normal. She exhibits distension. There is tenderness. There is no rebound and no guarding.  Musculoskeletal: Normal range of motion. She exhibits no edema.  Neurological: She is alert and oriented to person, place, and time.  Skin: Skin is warm and dry.  Psychiatric: She has a normal mood and affect.     ED Treatments / Results  Labs (all labs ordered are listed, but only abnormal results are  displayed) Labs Reviewed - No data to display  EKG None  Radiology Ct Abdomen Pelvis W Contrast  Result Date: 03/22/2018 CLINICAL DATA:  84 year old female with generalized abdominal pain since Friday with increased abdominal distention. EXAM: CT ABDOMEN AND PELVIS WITH CONTRAST TECHNIQUE: Multidetector CT imaging of the abdomen and pelvis was performed using the standard protocol following bolus administration of intravenous contrast. CONTRAST:  155mL ISOVUE-300 IOPAMIDOL (ISOVUE-300) INJECTION 61% COMPARISON:  10/22/2015 FINDINGS: Lower chest: Borderline cardiomegaly with trace pericardial effusion. Bibasilar dependent atelectasis. Small focus of airspace opacity consistent with atelectasis in the medial inferior right middle lobe. No effusion or pneumothorax. Hepatobiliary: Homogeneous appearance of the liver without space-occupying enhancing mass. Physiologic distention of the gallbladder without stones. No biliary dilatation is identified. Pancreas: Mild ectasia of the pancreatic duct without space-occupying  mass nor inflammation. No ductal calcifications. Spleen: Normal size spleen without mass. Adrenals/Urinary Tract: Adrenal glands are unremarkable. Kidneys are normal, without renal calculi, focal lesion, or hydronephrosis. Bladder is unremarkable. Stomach/Bowel: The stomach is decompressed. A significant amount of fecal retention is seen throughout the colon compatible with constipation. No bowel inflammation. Proximally, there is mild diffuse fluid-filled distention of small bowel likely due to the large amount of stool present within the colon. Other differential possibilities may include a small bowel enteritis or small bowel dysmotility. The appendix is not confidently identified but no pericecal inflammation is seen. Vascular/Lymphatic: Moderate aortoiliac and branch vessel atherosclerosis without adenopathy. Reproductive: Status post hysterectomy. No adnexal masses. Other: No free air.  No  abdominopelvic ascites. Musculoskeletal: Lumbar spondylosis with multilevel degenerative disc disease and facet arthropathy, similar to prior. Posterior lumbar spinal fusion at L5-S1. IMPRESSION: Significant stool retention within the colon compatible with constipation is likely contributing to mild diffuse fluid-filled small bowel loops. Other differential possibilities accounting for the appearance of the small intestine would include small bowel dysmotility or possibly a small bowel enteritis. Electronically Signed   By: Ashley Royalty M.D.   On: 03/22/2018 22:22    Procedures Procedures (including critical care time)  Medications Ordered in ED Medications - No data to display   Initial Impression / Assessment and Plan / ED Course  I have reviewed the triage vital signs and the nursing notes.  Pertinent labs & imaging results that were available during my care of the patient were reviewed by me and considered in my medical decision making (see chart for details).     Patient discussed with and seen by Dr. Johnney Killian.  Patient's symptoms and diagnostic findings not suspicious for emergent cause of abdominal pain such as appendicitis, abdominal aortic aneurysm, pancreatitis, SBO, mesenteric ischemia, bacterial illness. No red flags concerning for cancer as constipation etiology. Patient will be discharged home with strict return precautions and follow-up with PCP.  Final Clinical Impressions(s) / ED Diagnoses   Final diagnoses:  Constipation, unspecified constipation type    ED Discharge Orders    None       [name], NP 03/23/18 Sharl Ma    Charlesetta Shanks, MD 04/01/18 [phone]

## 2018-03-22 NOTE — ED Provider Notes (Signed)
Medical screening examination/treatment/procedure(s) were conducted as a shared visit with non-physician practitioner(s) and myself.  I personally evaluated the patient during the encounter.  None She reports she had some pain 4 days ago.  She reports it was fairly severe.  She reports that she was having only small amounts of bowel movements the couple of days leading up to that.  Reports she continues to pass gas.  Her abdomen has become very distended.  Patient was seen at PCP office and referred to the emergency department.  Patient is alert and nontoxic.  Clinically well in appearance with good mental status and no respiratory distress.  Color good.  Heart regular no gross rub murmur gallop.  Lungs bilaterally clear.  Positive bowel sounds.  Abdomen is significantly distended.  No guarding.  No lower extremity edema.  Agree with plan and management.   Charlesetta Shanks, MD 03/22/18 2019

## 2018-03-23 MED ORDER — POLYETHYLENE GLYCOL 3350 17 G PO PACK: 17.0000 g | PACK | Freq: Every day | ORAL | 0 refills | Status: AC

## 2018-03-23 NOTE — Discharge Instructions (Addendum)
Take miralax as directed. You may want to trial magnesium citrate (200 ml one time per day) if poor response to miralax. Follow-up with your PCP.

## 2018-03-31 DIAGNOSIS — R140 Abdominal distension (gaseous): Secondary | ICD-10-CM | POA: Diagnosis not present

## 2018-03-31 DIAGNOSIS — K5901 Slow transit constipation: Secondary | ICD-10-CM | POA: Diagnosis not present

## 2018-03-31 DIAGNOSIS — E7800 Pure hypercholesterolemia, unspecified: Secondary | ICD-10-CM | POA: Diagnosis not present

## 2018-03-31 DIAGNOSIS — C799 Secondary malignant neoplasm of unspecified site: Secondary | ICD-10-CM | POA: Diagnosis not present

## 2018-04-13 DIAGNOSIS — Z1231 Encounter for screening mammogram for malignant neoplasm of breast: Secondary | ICD-10-CM | POA: Diagnosis not present

## 2018-04-13 DIAGNOSIS — Z779 Other contact with and (suspected) exposures hazardous to health: Secondary | ICD-10-CM | POA: Diagnosis not present

## 2018-05-04 DIAGNOSIS — E7800 Pure hypercholesterolemia, unspecified: Secondary | ICD-10-CM | POA: Diagnosis not present

## 2018-05-04 DIAGNOSIS — E039 Hypothyroidism, unspecified: Secondary | ICD-10-CM | POA: Diagnosis not present

## 2018-05-04 DIAGNOSIS — I10 Essential (primary) hypertension: Secondary | ICD-10-CM | POA: Diagnosis not present

## 2018-05-04 DIAGNOSIS — Z0000 Encounter for general adult medical examination without abnormal findings: Secondary | ICD-10-CM | POA: Diagnosis not present

## 2018-05-04 DIAGNOSIS — D638 Anemia in other chronic diseases classified elsewhere: Secondary | ICD-10-CM | POA: Diagnosis not present

## 2018-05-04 DIAGNOSIS — Z131 Encounter for screening for diabetes mellitus: Secondary | ICD-10-CM | POA: Diagnosis not present

## 2018-05-11 DIAGNOSIS — D692 Other nonthrombocytopenic purpura: Secondary | ICD-10-CM | POA: Diagnosis not present

## 2018-05-11 DIAGNOSIS — D649 Anemia, unspecified: Secondary | ICD-10-CM | POA: Diagnosis not present

## 2018-05-11 DIAGNOSIS — E7800 Pure hypercholesterolemia, unspecified: Secondary | ICD-10-CM | POA: Diagnosis not present

## 2018-05-11 DIAGNOSIS — Z0000 Encounter for general adult medical examination without abnormal findings: Secondary | ICD-10-CM | POA: Diagnosis not present

## 2018-05-11 DIAGNOSIS — I10 Essential (primary) hypertension: Secondary | ICD-10-CM | POA: Diagnosis not present

## 2018-05-11 DIAGNOSIS — I82502 Chronic embolism and thrombosis of unspecified deep veins of left lower extremity: Secondary | ICD-10-CM | POA: Diagnosis not present

## 2018-05-11 DIAGNOSIS — G20 Parkinson's disease: Secondary | ICD-10-CM | POA: Diagnosis not present

## 2018-05-11 DIAGNOSIS — C799 Secondary malignant neoplasm of unspecified site: Secondary | ICD-10-CM | POA: Diagnosis not present

## 2018-05-11 DIAGNOSIS — E039 Hypothyroidism, unspecified: Secondary | ICD-10-CM | POA: Diagnosis not present

## 2018-06-08 DIAGNOSIS — E039 Hypothyroidism, unspecified: Secondary | ICD-10-CM | POA: Diagnosis not present

## 2018-06-08 DIAGNOSIS — D692 Other nonthrombocytopenic purpura: Secondary | ICD-10-CM | POA: Diagnosis not present

## 2018-06-08 DIAGNOSIS — C799 Secondary malignant neoplasm of unspecified site: Secondary | ICD-10-CM | POA: Diagnosis not present

## 2018-06-08 DIAGNOSIS — G20 Parkinson's disease: Secondary | ICD-10-CM | POA: Diagnosis not present

## 2018-06-08 DIAGNOSIS — F0390 Unspecified dementia without behavioral disturbance: Secondary | ICD-10-CM | POA: Diagnosis not present

## 2018-06-15 ENCOUNTER — Other Ambulatory Visit: Payer: Self-pay

## 2018-06-15 ENCOUNTER — Emergency Department (HOSPITAL_COMMUNITY)
Admission: EM | Admit: 2018-06-15 | Discharge: 2018-06-15 | Disposition: A | Discharge status: Home / Self Care | Payer: PPO | Attending: Emergency Medicine | Admitting: Emergency Medicine

## 2018-06-15 ENCOUNTER — Encounter (HOSPITAL_COMMUNITY): Payer: Self-pay | Admitting: *Deleted

## 2018-06-15 DIAGNOSIS — Y92009 Unspecified place in unspecified non-institutional (private) residence as the place of occurrence of the external cause: Secondary | ICD-10-CM | POA: Diagnosis not present

## 2018-06-15 DIAGNOSIS — Y9389 Activity, other specified: Secondary | ICD-10-CM | POA: Diagnosis not present

## 2018-06-15 DIAGNOSIS — S61412A Laceration without foreign body of left hand, initial encounter: Secondary | ICD-10-CM | POA: Diagnosis not present

## 2018-06-15 DIAGNOSIS — Z79899 Other long term (current) drug therapy: Secondary | ICD-10-CM | POA: Insufficient documentation

## 2018-06-15 DIAGNOSIS — W228XXA Striking against or struck by other objects, initial encounter: Secondary | ICD-10-CM | POA: Diagnosis not present

## 2018-06-15 DIAGNOSIS — Z7901 Long term (current) use of anticoagulants: Secondary | ICD-10-CM | POA: Insufficient documentation

## 2018-06-15 DIAGNOSIS — Y998 Other external cause status: Secondary | ICD-10-CM | POA: Diagnosis not present

## 2018-06-15 DIAGNOSIS — S61419A Laceration without foreign body of unspecified hand, initial encounter: Secondary | ICD-10-CM

## 2018-06-15 DIAGNOSIS — Z23 Encounter for immunization: Secondary | ICD-10-CM | POA: Diagnosis not present

## 2018-06-15 MED ORDER — TETANUS-DIPHTH-ACELL PERTUSSIS 5-2.5-18.5 LF-MCG/0.5 IM SUSP
0.5000 mL | Freq: Once | INTRAMUSCULAR | Status: AC
  Administered 2018-06-15: 0.5 mL via INTRAMUSCULAR
  Filled 2018-06-15: qty 0.5

## 2018-06-15 NOTE — ED Triage Notes (Signed)
Pt has skin tear to right hand; bleeding controlled at this time

## 2018-06-15 NOTE — ED Provider Notes (Signed)
[facility] EMERGENCY DEPARTMENT Provider Note   CSN: [id number] Arrival date & time: 06/15/18  0043     History   Chief Complaint Chief Complaint  Patient presents with  . Laceration    HPI Dana Klein is a 84 y.o. female.  She presents for evaluation of a laceration to her left hand.  She reports that she was doing laundry when a hanger caught her hand and caused a skin tear.  She is on Xarelto.  She reports that she has been able to control the bleeding with direct pressure.  She is not experiencing any significant pain.     Past Medical History:  Diagnosis Date  . Back pain   . Bladder incontinence   . DVT (deep venous thrombosis) ([place]) 11/2017  . Hearing loss   . Leg numbness   . Numbness of foot   . Osteoarthritis   . Thyroid disease     Patient Active Problem List   Diagnosis Date Noted  . Lymphadenopathy, abdominal   . Arthritis 07/22/2015  . Essential tremor 07/22/2015  . Tremor 03/08/2015  . Hereditary and idiopathic peripheral neuropathy 03/08/2015  . Back problem 06/10/2011  . Osteoarthritis 06/10/2011  . Hearing loss 06/10/2011  . Bladder incontinence 06/10/2011    Past Surgical History:  Procedure Laterality Date  . ABDOMINAL HYSTERECTOMY    . BACK SURGERY    . CARPAL TUNNEL RELEASE    . Left Wrist Surgery       OB History   None      Home Medications    Prior to Admission medications   Medication Sig Start Date End Date Taking? Authorizing Provider  acetaminophen (TYLENOL) 500 MG tablet Take 500 mg by mouth 2 (two) times daily with a meal.    [provider]  Calcium Carbonate-Vitamin D (CALCIUM-D PO) Take 1 tablet by mouth 2 (two) times daily with a meal. Triple Strength    [provider]  docusate sodium (COLACE) 100 MG capsule Take 100 mg by mouth 2 (two) times daily.    [provider]  DULoxetine (CYMBALTA) 30 MG capsule Take 30 mg by mouth daily. 12/20/14   [provider]  gabapentin  (NEURONTIN) 300 MG capsule 1 CAP IN THE MORNING, 1 CAP IN AFTERNOON AND 2 CAPS IN EVENING-CAN INCREASE TO 2 CAPS 3X DAILY Patient taking differently: 1 CAP IN THE MORNING, 1 CAP IN AFTERNOON AND 2 CAPS IN EVENING 11/24/17   Melvenia Beam, MD  levothyroxine (SYNTHROID, LEVOTHROID) 75 MCG tablet Take 75 mcg by mouth daily before breakfast.  12/07/16   [provider]  magnesium oxide (MAG-OX) 400 MG tablet Take 400 mg by mouth at bedtime.     [provider]  Misc Natural Products (OSTEO BI-FLEX ADV JOINT SHIELD PO) Take 1 capsule by mouth 2 (two) times daily.     [provider]  Multiple Vitamin (MULTIVITAMIN WITH MINERALS) TABS tablet Take 1 tablet by mouth daily.    [provider]  Omega-3 Fatty Acids (FISH OIL) 1000 MG CAPS Take 1,000 mg by mouth 2 (two) times daily with a meal.    [provider]  Polyethyl Glycol-Propyl Glycol (SYSTANE ULTRA OP) Place 1 drop into both eyes daily.    [provider]  Polyethyl Glycol-Propyl Glycol (SYSTANE) 0.4-0.3 % GEL ophthalmic gel Place 1 application into both eyes at bedtime.    [provider]  polyethylene glycol (MIRALAX / GLYCOLAX) packet Take 17 g by mouth daily.  03/23/18   [name], NP  primidone (MYSOLINE) 50 MG tablet Take 1 tablet (50 mg total) by mouth at bedtime. 11/24/17   Melvenia Beam, MD  rivaroxaban (XARELTO) 20 MG TABS tablet Take 20 mg by mouth daily with supper.    [provider]  rOPINIRole (REQUIP) 1 MG tablet Take 0.5 tablets (0.5 mg total) by mouth every evening. 3 hours before bedtime 11/24/17   Melvenia Beam, MD  solifenacin (VESICARE) 10 MG tablet Take 10 mg by mouth daily.     [provider]    Family History Family History  Problem Relation Age of Onset  . Lung cancer Mother   . Colon cancer Mother   . Pneumonia Father   . Heart defect Father   . Diabetes Sister   . Diabetes Brother   . Diabetes Sister   . Diabetes Brother      Social History Social History   Tobacco Use  . Smoking status: Never Smoker  . Smokeless tobacco: Never Used  Substance Use Topics  . Alcohol use: No    Alcohol/week: 0.0 oz  . Drug use: No     Allergies   Meloxicam; Propranolol; and Sulfa antibiotics   Review of Systems Review of Systems  Skin: Positive for wound.  Neurological: Negative for weakness and numbness.     Physical Exam Updated Vital Signs BP (!) 192/67 (BP Location: Left Arm)   Pulse (!) 58   Temp 97.8 F (36.6 C) (Oral)   Resp 16   Ht 5' (1.524 m)   Wt 47.6 kg (105 lb)   SpO2 100%   BMI 20.51 kg/m   Physical Exam  Constitutional: She is oriented to person, place, and time. She appears well-developed and well-nourished.  HENT:  Head: Atraumatic.  Eyes: Pupils are equal, round, and reactive to light.  Cardiovascular: Normal rate and regular rhythm.  Pulmonary/Chest: Effort normal and breath sounds normal.  Musculoskeletal: Normal range of motion.  Neurological: She is alert and oriented to person, place, and time.  Skin: Laceration (Superficial skin tear over the dorsal aspect of the back of right hand) noted.     ED Treatments / Results  Labs (all labs ordered are listed, but only abnormal results are displayed) Labs Reviewed - No data to display  EKG None  Radiology No results found.  Procedures Procedures (including critical care time)  Medications Ordered in ED Medications  Tdap (BOOSTRIX) injection 0.5 mL (0.5 mLs Intramuscular Given 06/15/18 0118)     Initial Impression / Assessment and Plan / ED Course  I have reviewed the triage vital signs and the nursing notes.  Pertinent labs & imaging results that were available during my care of the patient were reviewed by me and considered in my medical decision making (see chart for details).     Patient has a thin strip of tissue that is nonviable present on the back of the hand after suffering a skin tear.  A small area  of this was removed with forceps and scissors.  Wound was cleaned by nursing staff and then dressing was placed. Final Clinical Impressions(s) / ED Diagnoses   Final diagnoses:  Skin tear of hand without complication, initial encounter    ED Discharge Orders    None       Orpah Greek, MD 06/16/18 [phone]

## 2018-06-15 NOTE — ED Notes (Signed)
Pt laceration cleaned and dressed. No bleeding at this time

## 2018-06-21 DIAGNOSIS — M810 Age-related osteoporosis without current pathological fracture: Secondary | ICD-10-CM | POA: Diagnosis not present

## 2018-06-21 DIAGNOSIS — C799 Secondary malignant neoplasm of unspecified site: Secondary | ICD-10-CM | POA: Diagnosis not present

## 2018-06-21 DIAGNOSIS — R238 Other skin changes: Secondary | ICD-10-CM | POA: Diagnosis not present

## 2018-06-21 DIAGNOSIS — I10 Essential (primary) hypertension: Secondary | ICD-10-CM | POA: Diagnosis not present

## 2018-06-24 DIAGNOSIS — C801 Malignant (primary) neoplasm, unspecified: Secondary | ICD-10-CM | POA: Diagnosis not present

## 2018-07-06 DIAGNOSIS — F0390 Unspecified dementia without behavioral disturbance: Secondary | ICD-10-CM | POA: Diagnosis not present

## 2018-07-06 DIAGNOSIS — I10 Essential (primary) hypertension: Secondary | ICD-10-CM | POA: Diagnosis not present

## 2018-07-06 DIAGNOSIS — E039 Hypothyroidism, unspecified: Secondary | ICD-10-CM | POA: Diagnosis not present

## 2018-07-30 DIAGNOSIS — M5441 Lumbago with sciatica, right side: Secondary | ICD-10-CM | POA: Diagnosis not present

## 2018-08-11 ENCOUNTER — Telehealth: Payer: Self-pay | Admitting: Oncology

## 2018-08-11 NOTE — Telephone Encounter (Signed)
FS PAL - moved 10/10 appointments to 10/29. Not able to reach patient by phone - number not in service. Schedule mailed.

## 2018-08-13 DIAGNOSIS — Z23 Encounter for immunization: Secondary | ICD-10-CM | POA: Diagnosis not present

## 2018-08-17 DIAGNOSIS — L0390 Cellulitis, unspecified: Secondary | ICD-10-CM | POA: Diagnosis not present

## 2018-08-17 DIAGNOSIS — G20 Parkinson's disease: Secondary | ICD-10-CM | POA: Diagnosis not present

## 2018-08-17 DIAGNOSIS — R0781 Pleurodynia: Secondary | ICD-10-CM | POA: Diagnosis not present

## 2018-08-17 DIAGNOSIS — S299XXA Unspecified injury of thorax, initial encounter: Secondary | ICD-10-CM | POA: Diagnosis not present

## 2018-08-17 DIAGNOSIS — I10 Essential (primary) hypertension: Secondary | ICD-10-CM | POA: Diagnosis not present

## 2018-08-23 ENCOUNTER — Observation Stay (HOSPITAL_COMMUNITY)
Admission: EM | Admit: 2018-08-23 | Discharge: 2018-08-23 | Disposition: A | Discharge status: Home / Self Care | Payer: PPO | Attending: Internal Medicine | Admitting: Internal Medicine

## 2018-08-23 ENCOUNTER — Observation Stay (HOSPITAL_COMMUNITY): Payer: PPO

## 2018-08-23 ENCOUNTER — Emergency Department (HOSPITAL_COMMUNITY): Payer: PPO

## 2018-08-23 ENCOUNTER — Encounter (HOSPITAL_COMMUNITY): Payer: Self-pay | Admitting: *Deleted

## 2018-08-23 ENCOUNTER — Other Ambulatory Visit: Payer: Self-pay

## 2018-08-23 ENCOUNTER — Observation Stay (HOSPITAL_BASED_OUTPATIENT_CLINIC_OR_DEPARTMENT_OTHER): Payer: PPO

## 2018-08-23 DIAGNOSIS — R29898 Other symptoms and signs involving the musculoskeletal system: Secondary | ICD-10-CM | POA: Diagnosis not present

## 2018-08-23 DIAGNOSIS — I6523 Occlusion and stenosis of bilateral carotid arteries: Secondary | ICD-10-CM | POA: Diagnosis not present

## 2018-08-23 DIAGNOSIS — Z7901 Long term (current) use of anticoagulants: Secondary | ICD-10-CM | POA: Diagnosis not present

## 2018-08-23 DIAGNOSIS — M5126 Other intervertebral disc displacement, lumbar region: Secondary | ICD-10-CM | POA: Diagnosis not present

## 2018-08-23 DIAGNOSIS — Y92007 Garden or yard of unspecified non-institutional (private) residence as the place of occurrence of the external cause: Secondary | ICD-10-CM | POA: Insufficient documentation

## 2018-08-23 DIAGNOSIS — C801 Malignant (primary) neoplasm, unspecified: Secondary | ICD-10-CM | POA: Diagnosis not present

## 2018-08-23 DIAGNOSIS — Z86718 Personal history of other venous thrombosis and embolism: Secondary | ICD-10-CM | POA: Diagnosis not present

## 2018-08-23 DIAGNOSIS — E039 Hypothyroidism, unspecified: Secondary | ICD-10-CM | POA: Insufficient documentation

## 2018-08-23 DIAGNOSIS — R531 Weakness: Secondary | ICD-10-CM | POA: Diagnosis not present

## 2018-08-23 DIAGNOSIS — G459 Transient cerebral ischemic attack, unspecified: Secondary | ICD-10-CM | POA: Diagnosis not present

## 2018-08-23 DIAGNOSIS — S82832A Other fracture of upper and lower end of left fibula, initial encounter for closed fracture: Secondary | ICD-10-CM | POA: Diagnosis not present

## 2018-08-23 DIAGNOSIS — Z9181 History of falling: Secondary | ICD-10-CM | POA: Insufficient documentation

## 2018-08-23 DIAGNOSIS — Y939 Activity, unspecified: Secondary | ICD-10-CM | POA: Diagnosis not present

## 2018-08-23 DIAGNOSIS — Z79899 Other long term (current) drug therapy: Secondary | ICD-10-CM | POA: Insufficient documentation

## 2018-08-23 DIAGNOSIS — R296 Repeated falls: Secondary | ICD-10-CM | POA: Diagnosis not present

## 2018-08-23 DIAGNOSIS — M48061 Spinal stenosis, lumbar region without neurogenic claudication: Secondary | ICD-10-CM | POA: Diagnosis not present

## 2018-08-23 DIAGNOSIS — Y999 Unspecified external cause status: Secondary | ICD-10-CM | POA: Diagnosis not present

## 2018-08-23 DIAGNOSIS — S0990XA Unspecified injury of head, initial encounter: Secondary | ICD-10-CM | POA: Diagnosis not present

## 2018-08-23 DIAGNOSIS — W010XXA Fall on same level from slipping, tripping and stumbling without subsequent striking against object, initial encounter: Secondary | ICD-10-CM | POA: Insufficient documentation

## 2018-08-23 DIAGNOSIS — R200 Anesthesia of skin: Secondary | ICD-10-CM | POA: Diagnosis not present

## 2018-08-23 DIAGNOSIS — R42 Dizziness and giddiness: Secondary | ICD-10-CM | POA: Diagnosis not present

## 2018-08-23 LAB — CBC WITH DIFFERENTIAL/PLATELET
BASOS ABS: 0 10*3/uL (ref 0.0–0.1)
BASOS PCT: 0 %
Eosinophils Absolute: 0 10*3/uL (ref 0.0–0.7)
Eosinophils Relative: 0 %
HEMATOCRIT: 34.6 % — AB (ref 36.0–46.0)
HEMOGLOBIN: 11.1 g/dL — AB (ref 12.0–15.0)
Lymphocytes Relative: 12 %
Lymphs Abs: 0.6 10*3/uL — ABNORMAL LOW (ref 0.7–4.0)
MCH: 31.7 pg (ref 26.0–34.0)
MCHC: 32.1 g/dL (ref 30.0–36.0)
MCV: 98.9 fL (ref 78.0–100.0)
Monocytes Absolute: 0.4 10*3/uL (ref 0.1–1.0)
Monocytes Relative: 8 %
NEUTROS PCT: 80 %
Neutro Abs: 3.8 10*3/uL (ref 1.7–7.7)
Platelets: 215 10*3/uL (ref 150–400)
RBC: 3.5 MIL/uL — ABNORMAL LOW (ref 3.87–5.11)
RDW: 14.1 % (ref 11.5–15.5)
WBC: 4.8 10*3/uL (ref 4.0–10.5)

## 2018-08-23 LAB — ECHOCARDIOGRAM COMPLETE
HEIGHTINCHES: 60 in
Weight: 1664.91 oz

## 2018-08-23 LAB — URINALYSIS, ROUTINE W REFLEX MICROSCOPIC
Bilirubin Urine: NEGATIVE
Glucose, UA: NEGATIVE mg/dL
Hgb urine dipstick: NEGATIVE
Ketones, ur: NEGATIVE mg/dL
LEUKOCYTES UA: NEGATIVE
NITRITE: NEGATIVE
PH: 7 (ref 5.0–8.0)
Protein, ur: NEGATIVE mg/dL
Specific Gravity, Urine: 1.01 (ref 1.005–1.030)

## 2018-08-23 LAB — COMPREHENSIVE METABOLIC PANEL
ALBUMIN: 3.5 g/dL (ref 3.5–5.0)
ALT: 27 U/L (ref 0–44)
ANION GAP: 7 (ref 5–15)
AST: 33 U/L (ref 15–41)
Alkaline Phosphatase: 82 U/L (ref 38–126)
BUN: 24 mg/dL — ABNORMAL HIGH (ref 8–23)
CALCIUM: 9.8 mg/dL (ref 8.9–10.3)
CO2: 26 mmol/L (ref 22–32)
Chloride: 106 mmol/L (ref 98–111)
Creatinine, Ser: 0.63 mg/dL (ref 0.44–1.00)
GFR calc Af Amer: >60 mL/min (ref >60)
GFR calc non Af Amer: >60 mL/min (ref >60)
Glucose, Bld: 105 mg/dL — ABNORMAL HIGH (ref 70–99)
POTASSIUM: 3.4 mmol/L — AB (ref 3.5–5.1)
SODIUM: 139 mmol/L (ref 135–145)
TOTAL PROTEIN: 6.4 g/dL — AB (ref 6.5–8.1)
Total Bilirubin: 0.6 mg/dL (ref 0.3–1.2)

## 2018-08-23 LAB — LIPID PANEL
CHOL/HDL RATIO: 3.5 ratio
Cholesterol: 188 mg/dL (ref 0–200)
HDL: 54 mg/dL (ref >40)
LDL CALC: 122 mg/dL — AB (ref 0–99)
Triglycerides: 61 mg/dL (ref <150)
VLDL: 12 mg/dL (ref 0–40)

## 2018-08-23 LAB — HEMOGLOBIN A1C
Hgb A1c MFr Bld: 5.9 % — ABNORMAL HIGH (ref 4.8–5.6)
MEAN PLASMA GLUCOSE: 122.63 mg/dL

## 2018-08-23 LAB — TROPONIN I

## 2018-08-23 MED ORDER — LEVOTHYROXINE SODIUM 50 MCG PO TABS
75.0000 ug | ORAL_TABLET | Freq: Every day | ORAL | Status: DC
  Administered 2018-08-23: 75 ug via ORAL
  Filled 2018-08-23: qty 2

## 2018-08-23 MED ORDER — POLYETHYLENE GLYCOL 3350 17 G PO PACK
17.0000 g | PACK | Freq: Every day | ORAL | Status: DC
  Administered 2018-08-23: 17 g via ORAL
  Filled 2018-08-23: qty 1

## 2018-08-23 MED ORDER — HYDROCODONE-ACETAMINOPHEN 5-325 MG PO TABS: 1.0000 | ORAL_TABLET | ORAL | 0 refills | Status: DC | PRN

## 2018-08-23 MED ORDER — ACETAMINOPHEN 325 MG PO TABS: 650.0000 mg | ORAL_TABLET | ORAL | Status: DC | PRN

## 2018-08-23 MED ORDER — GABAPENTIN 300 MG PO CAPS: 300.0000 mg | ORAL_CAPSULE | ORAL | Status: DC

## 2018-08-23 MED ORDER — STROKE: EARLY STAGES OF RECOVERY BOOK
Freq: Once | Status: AC
  Administered 2018-08-23: 06:00:00
  Filled 2018-08-23 (×2): qty 1

## 2018-08-23 MED ORDER — PRIMIDONE 50 MG PO TABS: 50.0000 mg | ORAL_TABLET | Freq: Every day | ORAL | Status: DC

## 2018-08-23 MED ORDER — MORPHINE SULFATE (PF) 4 MG/ML IV SOLN: 3.0000 mg | INTRAVENOUS | Status: DC | PRN

## 2018-08-23 MED ORDER — ADULT MULTIVITAMIN W/MINERALS CH
1.0000 | ORAL_TABLET | Freq: Every day | ORAL | Status: DC
  Administered 2018-08-23: 1 via ORAL
  Filled 2018-08-23: qty 1

## 2018-08-23 MED ORDER — POLYETHYL GLYCOL-PROPYL GLYCOL 0.4-0.3 % OP GEL
1.0000 "application " | Freq: Every day | OPHTHALMIC | Status: DC
  Filled 2018-08-23: qty 10

## 2018-08-23 MED ORDER — ACETAMINOPHEN 650 MG RE SUPP: 650.0000 mg | RECTAL | Status: DC | PRN

## 2018-08-23 MED ORDER — RIVAROXABAN 20 MG PO TABS
20.0000 mg | ORAL_TABLET | Freq: Every day | ORAL | Status: DC
  Administered 2018-08-23: 20 mg via ORAL
  Filled 2018-08-23: qty 1

## 2018-08-23 MED ORDER — HYDROCODONE-ACETAMINOPHEN 5-325 MG PO TABS
1.0000 | ORAL_TABLET | ORAL | Status: DC | PRN
  Administered 2018-08-23: 1 via ORAL
  Filled 2018-08-23 (×2): qty 1

## 2018-08-23 MED ORDER — ROPINIROLE HCL 0.25 MG PO TABS
0.5000 mg | ORAL_TABLET | Freq: Every evening | ORAL | Status: DC
  Administered 2018-08-23: 0.5 mg via ORAL
  Filled 2018-08-23 (×2): qty 1
  Filled 2018-08-23: qty 2

## 2018-08-23 MED ORDER — GABAPENTIN 300 MG PO CAPS
600.0000 mg | ORAL_CAPSULE | Freq: Every evening | ORAL | Status: DC
  Administered 2018-08-23: 600 mg via ORAL
  Filled 2018-08-23: qty 2

## 2018-08-23 MED ORDER — DOCUSATE SODIUM 100 MG PO CAPS
100.0000 mg | ORAL_CAPSULE | Freq: Two times a day (BID) | ORAL | Status: DC
  Administered 2018-08-23: 100 mg via ORAL
  Filled 2018-08-23: qty 1

## 2018-08-23 MED ORDER — ACETAMINOPHEN 160 MG/5ML PO SOLN: 650.0000 mg | ORAL | Status: DC | PRN

## 2018-08-23 MED ORDER — MAGNESIUM OXIDE 400 (241.3 MG) MG PO TABS: 400.0000 mg | ORAL_TABLET | Freq: Every day | ORAL | Status: DC

## 2018-08-23 MED ORDER — POLYVINYL ALCOHOL 1.4 % OP SOLN: 1.0000 [drp] | Freq: Every day | OPHTHALMIC | Status: DC

## 2018-08-23 MED ORDER — DARIFENACIN HYDROBROMIDE ER 7.5 MG PO TB24
15.0000 mg | ORAL_TABLET | Freq: Every day | ORAL | Status: DC
  Administered 2018-08-23: 15 mg via ORAL
  Filled 2018-08-23: qty 1
  Filled 2018-08-23: qty 2

## 2018-08-23 MED ORDER — POTASSIUM CHLORIDE IN NACL 20-0.9 MEQ/L-% IV SOLN
INTRAVENOUS | Status: AC
  Administered 2018-08-23: 06:00:00 via INTRAVENOUS

## 2018-08-23 MED ORDER — DULOXETINE HCL 30 MG PO CPEP
30.0000 mg | ORAL_CAPSULE | Freq: Every day | ORAL | Status: DC
  Administered 2018-08-23: 30 mg via ORAL
  Filled 2018-08-23: qty 1

## 2018-08-23 MED ORDER — GABAPENTIN 300 MG PO CAPS
300.0000 mg | ORAL_CAPSULE | Freq: Two times a day (BID) | ORAL | Status: DC
  Administered 2018-08-23: 300 mg via ORAL
  Filled 2018-08-23: qty 1

## 2018-08-23 MED ORDER — OMEGA-3-ACID ETHYL ESTERS 1 G PO CAPS
1.0000 g | ORAL_CAPSULE | Freq: Two times a day (BID) | ORAL | Status: DC
  Administered 2018-08-23: 1 g via ORAL
  Filled 2018-08-23: qty 1

## 2018-08-23 NOTE — Progress Notes (Signed)
Pt was off the floor at the time vitals and neuro's were due. Re-started at 0911, then resumed every 2 hours until time for Q4 vitals.

## 2018-08-23 NOTE — ED Notes (Signed)
Pt to CT

## 2018-08-23 NOTE — Progress Notes (Signed)
OT Cancellation Note  Patient Details Name: Dana Klein MRN: [id number] DOB: Jul 31, 1928   Cancelled Treatment:    Reason Eval/Treat Not Completed: Patient at procedure or test/ unavailable   Ailene Ravel, OTR/L,CBIS  [phone]  08/23/2018, 8:37 AM

## 2018-08-23 NOTE — Evaluation (Signed)
Physical Therapy Evaluation Patient Details Name: Dana Klein MRN: [id number] DOB: 20-Jul-1928 Today's Date: 08/23/2018   History of Present Illness  Dana Klein is an 84yo female who comes to [facility] 10/7 after multiple falls at home and edema/pain in left ankle. Pt noted ot have Left fibula fracture.MRI negative for acute brain changes, and Lumbar imaging suggestive of progressive stenosis at 3 levels, noted moderate at one and mild at two others. PMH: DVTon zarelto, carcinoma, hypoTSH, lumbar interbody fusionL5/S1.   Clinical Impression  Pt admitted with above diagnosis. Pt currently with functional limitations due to the deficits listed below (see "PT Problem List"). Upon entry, pt in bed, no family/caregiver present- daughter enters mid-session. The pt is awake and agreeable to participate, aware of weightbearing status. The pt is alert and oriented x3, pleasant, conversational, and following simple commands consistently. Pt performs bed mobility with minA,andtransfers with minGuard Assist. Pt puts forth great effort in AMB, but ultimately is limited by weakness in arms and leg, unable to advance >3 feet. Functional mobility assessment demonstrates increased effort/time requirements, poor tolerance, and need for physical assistance, whereas the patient performed these at a higher level of independence PTA. Empirically, the patient demonstrates increased risk of recurrent falls AEB gait speed <0.3m/s, forward reach <5", and multiple LOB demonstrated throughout session. DTR is arranging for additional family to come in for [facility] mobility training to safely get the patient into her home up 1 step. Pt will benefit from skilled PT intervention to increase independence and safety with basic mobility in preparation for discharge to the venue listed below.    Patient suffers from a left fibular fracture, now non-weight bearing, which impairs their ability to perform daily activities like dressing, toileting,  and eatng in the home.  A walker alone will not resolve the issues with performing activities of daily living. A wheelchair will allow patient to safely perform daily activities.  Pt is not able to propel themselves in  the home using a standard weight wheelchair due to generalized weakness. The patient can self propel in the light weight wheel chair.         Follow Up Recommendations Home health PT    Equipment Recommendations  Wheelchair (measurements PT);3in1 (PT);Wheelchair cushion (measurements PT)(lightweight WC )    Recommendations for Other Services       Precautions / Restrictions Precautions Precautions: Fall Restrictions LLE Weight Bearing: Non weight bearing      Mobility  Bed Mobility Overal bed mobility: Needs Assistance Bed Mobility: Supine to Sit     Supine to sit: Min assist     General bed mobility comments: SIngle hand hold offered to allow patient to pull self up.   Transfers Overall transfer level: Needs assistance Equipment used: Rolling walker (2 wheeled) Transfers: Sit to/from [facility] Sit to Stand: Min guard Stand pivot transfers: Min assist(some assist forbalance andfor managementof RW )       General transfer comment: performed 2x from EOB, and chair.   Ambulation/Gait Ambulation/Gait assistance: Min guard Gait Distance (Feet): 2 Feet Assistive device: Rolling walker (2 wheeled)(needs a peditric height RW foroptimal performance. )       General Gait Details: RLE 2-pointhop-to gait with RW. hop heigh veyr limited and unable to gain much ground d/t weakness in armsand legs, although pt puts forth great effort.   Stairs            Wheelchair Mobility    Modified Rankin (Stroke Patients Only)  Balance Overall balance assessment: History of Falls;Needs assistance Sitting-balance support: No upper extremity supported;Feet supported Sitting balance-Leahy Scale: Good     Standing balance support: Single  extremity supported;During functional activity;Bilateral upper extremity supported Standing balance-Leahy Scale: Poor                               Pertinent Vitals/Pain Pain Assessment: 0-10 Pain Score: 6  Pain Location: Left Ankle  Pain Intervention(s): Limited activity within patient's tolerance;Monitored during session;Premedicated before session;Repositioned;Patient requesting pain meds-RN notified;RN gave pain meds during session    [place] expects to be discharged to:: Private residence Living Arrangements: Alone Available Help at Discharge: Family Type of Home: House Home Access: Stairs to enter Entrance Stairs-Rails: Right Entrance Stairs-Number of Steps: 3 steps into washroom (1 rail), then 1 step into kitchen (2rails)  Home Layout: One level;Laundry or work area in basement;Able to live on main level with bedroom/bathroom Home Equipment: Kasandra Knudsen - single point;Walker - 2 wheels      Prior Function Level of Independence: Needs assistance   Gait / Transfers Assistance Needed: does not drive, limited community AMB c SPC PTA, no AD in home.   ADL's / Homemaking Assistance Needed: takes baths Independently.         Hand Dominance   Dominant Hand: Right    Extremity/Trunk Assessment   Upper Extremity Assessment Upper Extremity Assessment: Generalized weakness    Lower Extremity Assessment Lower Extremity Assessment: Generalized weakness       Communication   Communication: HOH  Cognition Arousal/Alertness: Awake/alert Behavior During Therapy: WFL for tasks assessed/performed Overall Cognitive Status: Within Functional Limits for tasks assessed                                        General Comments      Exercises     Assessment/Plan    PT Assessment Patient needs continued PT services  PT Problem List Decreased strength;Decreased range of motion;Decreased activity tolerance;Decreased balance;Decreased  mobility       PT Treatment Interventions Gait training;Stair training;Wheelchair mobility training;Therapeutic activities;Functional mobility training;Therapeutic exercise;Balance training;Patient/family education    PT Goals (Current goals can be found in the Care Plan section)  Acute Rehab PT Goals Patient Stated Goal: access homeindependently  PT Goal Formulation: With patient Time For Goal Achievement: 09/06/18 Potential to Achieve Goals: Fair    Frequency Min 5X/week   Barriers to discharge Inaccessible home environment;Decreased caregiver support steps to enter    Co-evaluation               AM-PAC PT "6 Clicks" Daily Activity  Outcome Measure Difficulty turning over in bed (including adjusting bedclothes, sheets and blankets)?: A Lot Difficulty moving from lying on back to sitting on the side of the bed? : Unable Difficulty sitting down on and standing up from a chair with arms (e.g., wheelchair, bedside commode, etc,.)?: Unable Help needed moving to and from a bed to chair (including a wheelchair)?: A Little Help needed walking in hospital room?: A Lot Help needed climbing 3-5 steps with a railing? : Total 6 Click Score: 10    End of Session Equipment Utilized During Treatment: Gait belt Activity Tolerance: Patient limited by fatigue;Patient tolerated treatment well Patient left: in chair;with family/visitor present;with call bell/phone within reach Nurse Communication: Mobility status PT Visit Diagnosis: Unsteadiness on feet (  R26.81);Other abnormalities of gait and mobility (R26.89);Difficulty in walking, not elsewhere classified (R26.2);Muscle weakness (generalized) (M62.81)    Time: 9794-9971 PT Time Calculation (min) (ACUTE ONLY): 53 min   Charges:   PT Evaluation $PT Eval Moderate Complexity: 1 Mod PT Treatments $Therapeutic Activity: 23-37 mins       4:09 PM, 08/23/18 Etta Grandchild, PT, DPT Physical Therapist - [place] [phone]  [phone] (Office)   Frank Novelo C 08/23/2018, 4:02 PM

## 2018-08-23 NOTE — Discharge Instructions (Signed)
Fall Prevention in the Home Falls can cause injuries. They can happen to people of all ages. There are many things you can do to make your home safe and to help prevent falls. What can I do on the outside of my home?  Regularly fix the edges of walkways and driveways and fix any cracks.  Remove anything that might make you trip as you walk through a door, such as a raised step or threshold.  Trim any bushes or trees on the path to your home.  Use bright outdoor lighting.  Clear any walking paths of anything that might make someone trip, such as rocks or tools.  Regularly check to see if handrails are loose or broken. Make sure that both sides of any steps have handrails.  Any raised decks and porches should have guardrails on the edges.  Have any leaves, snow, or ice cleared regularly.  Use sand or salt on walking paths during winter.  Clean up any spills in your garage right away. This includes oil or grease spills. What can I do in the bathroom?  Use night lights.  Install grab bars by the toilet and in the tub and shower. Do not use towel bars as grab bars.  Use non-skid mats or decals in the tub or shower.  If you need to sit down in the shower, use a plastic, non-slip stool.  Keep the floor dry. Clean up any water that spills on the floor as soon as it happens.  Remove soap buildup in the tub or shower regularly.  Attach bath mats securely with double-sided non-slip rug tape.  Do not have throw rugs and other things on the floor that can make you trip. What can I do in the bedroom?  Use night lights.  Make sure that you have a light by your bed that is easy to reach.  Do not use any sheets or blankets that are too big for your bed. They should not hang down onto the floor.  Have a firm chair that has side arms. You can use this for support while you get dressed.  Do not have throw rugs and other things on the floor that can make you trip. What can I do in the  kitchen?  Clean up any spills right away.  Avoid walking on wet floors.  Keep items that you use a lot in easy-to-reach places.  If you need to reach something above you, use a strong step stool that has a grab bar.  Keep electrical cords out of the way.  Do not use floor polish or wax that makes floors slippery. If you must use wax, use non-skid floor wax.  Do not have throw rugs and other things on the floor that can make you trip. What can I do with my stairs?  Do not leave any items on the stairs.  Make sure that there are handrails on both sides of the stairs and use them. Fix handrails that are broken or loose. Make sure that handrails are as long as the stairways.  Check any carpeting to make sure that it is firmly attached to the stairs. Fix any carpet that is loose or worn.  Avoid having throw rugs at the top or bottom of the stairs. If you do have throw rugs, attach them to the floor with carpet tape.  Make sure that you have a light switch at the top of the stairs and the bottom of the stairs. If you do  not have them, ask someone to add them for you. What else can I do to help prevent falls?  Wear shoes that: ? Do not have high heels. ? Have rubber bottoms. ? Are comfortable and fit you well. ? Are closed at the toe. Do not wear sandals.  If you use a stepladder: ? Make sure that it is fully opened. Do not climb a closed stepladder. ? Make sure that both sides of the stepladder are locked into place. ? Ask someone to hold it for you, if possible.  Clearly mark and make sure that you can see: ? Any grab bars or handrails. ? First and last steps. ? Where the edge of each step is.  Use tools that help you move around (mobility aids) if they are needed. These include: ? Canes. ? Walkers. ? Scooters. ? Crutches.  Turn on the lights when you go into a dark area. Replace any light bulbs as soon as they burn out.  Set up your furniture so you have a clear path.  Avoid moving your furniture around.  If any of your floors are uneven, fix them.  If there are any pets around you, be aware of where they are.  Review your medicines with your doctor. Some medicines can make you feel dizzy. This can increase your chance of falling. Ask your doctor what other things that you can do to help prevent falls. This information is not intended to replace advice given to you by your health care provider. Make sure you discuss any questions you have with your health care provider. Document Released: 08/30/2009 Document Revised: 04/10/2016 Document Reviewed: 12/08/2014 Elsevier Interactive Patient Education  2018 [employer].   Transient Ischemic Attack A transient ischemic attack (TIA) is a "warning stroke" that causes stroke-like symptoms. A TIA does not cause lasting damage to the brain. The symptoms of a TIA can happen fast and do not last long. It is important to know the symptoms of a TIA and what to do. This can help prevent stroke or death. Follow these instructions at home:  Take medicines only as told by your doctor. Make sure you understand all of the instructions.  You may need to take aspirin or warfarin medicine. Warfarin needs to be taken exactly as told. ? Taking too much or too little warfarin is dangerous. Blood tests must be done as often as told by your doctor. A PT blood test measures how long it takes for blood to clot. Your PT is used to calculate another value called an INR. Your PT and INR help your doctor adjust your warfarin dosage. He or she will make sure you are taking the right amount. ? Food can cause problems with warfarin and affect the results of your blood tests. This is true for foods high in vitamin K. Eat the same amount of foods high in vitamin K each day. Foods high in vitamin K include spinach, kale, broccoli, cabbage, collard and turnip greens, Brussels sprouts, peas, cauliflower, seaweed, and parsley. Other foods high in  vitamin K include beef and pork liver, green tea, and soybean oil. Eat the same amount of foods high in vitamin K each day. Avoid big changes in your diet. Tell your doctor before changing your diet. Talk to a food specialist (dietitian) if you have questions. ? Many medicines can cause problems with warfarin and affect your PT and INR. Tell your doctor about all medicines you take. This includes vitamins and dietary pills (supplements). Do  not take or stop taking any prescribed or over-the-counter medicines unless your doctor tells you to. ? Warfarin can cause more bruising or bleeding. Hold pressure over any cuts for longer than normal. Talk to your doctor about other side effects of warfarin. ? Avoid sports or activities that may cause injury or bleeding. ? Be careful when you shave, floss, or use sharp objects. ? Avoid or drink very little alcohol while taking warfarin. Tell your doctor if you change how much alcohol you drink. ? Tell your dentist and other doctors that you take warfarin before any procedures.  Follow your diet program as told, if you are given one.  Keep a healthy weight.  Stay active. Try to get at least 30 minutes of activity on all or most days.  Do not use any tobacco products, including cigarettes, chewing tobacco, or electronic cigarettes. If you need help quitting, ask your doctor.  Limit alcohol intake to no more than 1 drink per day for nonpregnant women and 2 drinks per day for men. One drink equals 12 ounces of beer, 5 ounces of wine, or 1 ounces of hard liquor.  Do not abuse drugs.  Keep your home safe so you do not fall. You can do this by: ? Putting grab bars in the bedroom and bathroom. ? Raising toilet seats. ? Putting a seat in the shower.  Keep all follow-up visits as told by your doctor. This is important. Contact a doctor if:  Your personality changes.  You have trouble swallowing.  You have double vision.  You are dizzy.  You have a  fever. Get help right away if: These symptoms may be an emergency. Do not wait to see if the symptoms will go away. Get medical help right away. Call your local emergency services (911 in the [place] Do not drive yourself to the hospital.  You have sudden weakness or lose feeling (go numb), especially on one side of the body. This can affect your: ? Face. ? Arm. ? Leg.  You have sudden trouble walking.  You have sudden trouble moving your arms or legs.  You have sudden confusion.  You have trouble talking.  You have trouble understanding.  You have sudden trouble seeing in one or both eyes.  You lose your balance.  Your movements are not smooth.  You have a sudden, very bad headache with no known cause.  You have new chest pain.  Your heartbeat is unsteady.  You are partly or totally unaware of what is going on around you.  This information is not intended to replace advice given to you by your health care provider. Make sure you discuss any questions you have with your health care provider. Document Released: 08/12/2008 Document Revised: 07/07/2016 Document Reviewed: 02/08/2014 Elsevier Interactive Patient Education  [employer].

## 2018-08-23 NOTE — ED Triage Notes (Signed)
Pt states that she was working out in the yard yesterday 08/22/2018 sometime between 3pm and ?8pm, pt states that she was raking and reached too far loosing her balance but when she tried to get up her legs did not feel right, pt was able to scoot across the yard to the carport and had family help her get up. Pt sat outside for a little while and then went inside, a few hours later pt fell again stepping up into her kitchen using her left leg and tripped twisting her left ankle. Pt was able to use her walker to walk in the waiting room with no problems stating that her legs feel better now.

## 2018-08-23 NOTE — Progress Notes (Signed)
*  PRELIMINARY RESULTS* Echocardiogram 2D Echocardiogram has been performed.  Leavy Cella 08/23/2018, 12:34 PM

## 2018-08-23 NOTE — Care Management Obs Status (Signed)
[facility] NOTIFICATION   Patient Details  Name: Dana Klein MRN: [id number] Date of Birth: [birth year]   Medicare Observation Status Notification Given:  Yes    Sherald Barge, RN 08/23/2018, 8:48 AM

## 2018-08-23 NOTE — Consult Note (Addendum)
[facility] TeleNeurology Consult Services   STAT Neurology Consult   Chief Complaint: Recurrent falls and leg weakness  Date of service: 08/23/2018   HPI: Asked to see this patient in emergent telemedicine consultation utilizing interactive audio and video technologies. ?Consultation was performed with assistance of ancillary / medical staff at bedside.   Verbal consent to perform the examination with telemedicine was obtained. Patient agreed to proceed with the consultation.  84 year old right-handed white female who was brought to the emergency room by her daughter for recurrent falls, leg weakness, and left ankle pain and swelling.  Patient with some type of cancer and lymphadenopathy, and was diagnosed with a left leg DVT.  She is currently on Xarelto.  She took a dose last night.  Patient reported a fall last week while gathering some pears.  Sometime around 5:30 PM yesterday on 10/6 she was raking out in the yard.  She apparently reached too far with her rake, and lost her balance.  She fell and could not get up under her own power.  She noted that she could not get her legs under her correctly, and noted that she tired her legs out.  She ultimately scooted to a nearby carport and sat there for about 20 minutes.  Her daughter eventually helped her back into the house.  Then sometime around 8:30 PM, the daughter left the patient's home.  Apparently, as the patient walked back into the house, she fell again and noted that her left leg was weaker.  At some point, she noted bilateral leg weakness and was unable to get up.  Eventually she was able to get up on her own and use the bathroom.  She denies any syncope or loss of consciousness.  She has chronic low back pain, but denies any increasing in back pain.  She decided to come to the ER due to left ankle pain and swelling.  She currently feels fine and she is at her baseline.  Emergent tele-neurology consultation was requested by the ER  attending for concerns of possible TIA.  Daughter states that the patient typically can ambulate on her own and does not require any assistive devices.   PMH: Hypothyroidism, osteoarthritis, chronic low back pain, possible cancer with lymphadenopathy, and left leg DVT on Xarelto   SOC: Negative x3.  Patient lives alone.   [place]: Significant for lung and colon cancer and diabetes mellitus.   ROS: 13 point review systems were reviewed with the patient, and are all negative with the exception of the aforementioned in the history of present illness.   VS: Temperature is 98.8 F, blood pressure 192/93, pulse 79, oxygen saturation 100%   Exam: Patient is in no apparent distress.  Patient appears as stated age.  No obvious acute respiratory or cardiac distress.  Patient is well groomed and well-nourished. 1a- LOC: Keenly responsive - 0     1b- LOC questions: Answers both questions correctly - 0    1c- LOC commands- Performs both tasks correctly- 0    2- Gaze: Normal; no gaze paresis or gaze deviation - 0    3- Visual Fields: normal, no Visual field deficit - 0    4- Facial movements: no facial palsy - 0    5- Upper limb motor - no drift - 0    6- Lower limb motor - no drift - 0     7- Limb Coordination: absent ataxia - 0     8- Sensory: no sensory loss - 0  9- Language - No aphasia - 0     10- Speech - No dysarthria -0    11- Neglect / Extinction - none found - 0   NIHSS score: 0    Diagnostic Data: CT head is pending. Labs are pending.   Medical Data Reviewed:   1.Data?reviewed include clinical labs, radiology,?and medical tests;   2.Tests?results discussed w/performing or interpreting physician;   3.Obtaining/reviewing old medical records;   4.Obtaining?case history from another source;   5.Independent?review of image, tracing, or specimen.    Medical Decision Making:   - Extensive number of diagnosis or management options are considered below.   - Extensive amount of complex  data reviewed.   - High risk of complication and/or morbidity or mortality are associated with differential diagnostic considerations below.   - There may be?uncertain?outcome and increased probability of prolonged functional impairment or high probability of severe prolonged functional impairment associated with some of these differential diagnosis.    Differential Diagnosis for Stroke:   1.?Cardioembolic?stroke   2. Small vessel disease/lacune   3. Thromboembolic, artery-to-artery mechanism   4.?Hypercoagulable?state-related infarct   5. Transient ischemic attack   6. Thrombotic mechanism, large artery disease    Assessment: 1.  Recurrent falls and leg weakness 2.  Hypothyroidism 3.  Osteoarthritis 4.  Chronic low back pain 5.  History of left leg DVT on Xarelto 6.  Lymphadenopathy with possible cancer   Recommendations: TIA is a possibility given her complaints of focal left leg weakness for a period of time.  However, not sure if she just had a mechanical fall. Reasonable to obtain a brain MRI without contrast and also MRI of the lumbar spine without contrast to further evaluate her recurrent falls and leg weakness. Can maintain her on Xarelto for now.  In theory, the patient is protected from TIA phenomenon given that she is on Xarelto. Given her history of some type of unknown cancer, she could be in a hypercoagulable state despite being on the Xarelto, though. Can also check echocardiogram to gauge her cardiac function. Maintain the patient on telemetry to monitor for any paroxysmal cardiac arrhythmias. Check Carotid Ultrasound. Consult PT and OT. Check hemoglobin A1c and lipid panel. Consult local neurology team to assist with evaluation and management. Continue supportive care.   Thank you for allowing [facility] to participate in the care of your patient. Please call me, Dr. Dale Leonard, with any questions at [phone]. Case discussed with the ER staff and Dr.  Tomi Bamberger.

## 2018-08-23 NOTE — ED Notes (Signed)
Neuro consult in [place]

## 2018-08-23 NOTE — Discharge Summary (Signed)
Physician Discharge Summary  LILLIEMAE FRUGE WUJ:[id number] DOB: Apr 03, 1928 DOA: 08/23/2018  PCP: [name], MD  Admit date: 08/23/2018 Discharge date: 08/23/2018  Time spent: 45 minutes  Recommendations for Outpatient Follow-up:  Patient will be discharged to home.  Patient will need to follow up with primary care provider within one week of discharge, discuss cholesterol. Follow up with Dr. Vertell Limber, neurosurgery. Follow up with orthopedics, Dr. Doran Durand.  Patient should continue medications as prescribed.  Patient should follow a heart healthy diet.   Discharge Diagnoses:  Leg weakness, recurrent falls Left distal fibula fracture Carcinoma of unknown primary source Hypothyroidism  History of DVT  Discharge Condition: stable   Diet recommendation: heart healthy  Filed Weights   08/23/18 0028 08/23/18 0455  Weight: 47.2 kg 47.2 kg    History of present illness:  On 08/23/2018 by Dr. Christia Reading Opyd  Dana Klein Dana Klein is a 84 y.o. female with medical history significant for history of DVT on Xarelto, carcinoma with unknown primary, hypothyroidism, and tremor, now presenting to the emergency department for evaluation of recurrent falls, leg weakness, and left ankle pain and swelling.  Patient is accompanied by her daughter who assist with the history.  Patient reportedly fell approximately 1 week ago while doing yard work, reports that she was gathering peaches, piling them onto a sheet, and then attempting to drag that she follow peaches across her yard when she fell and has been experiencing pain in her upper back on the left since that time.  She has also had low back pain that has been unchanged.  And yesterday evening, she was raking her yard, describes reaching the right got up too far, losing balance, and falling.  She reports being too weak in both of her legs to get up.  She was eventually able to get into the house with the help of her daughter, but later fell again, twisting her  left ankle, and experiencing immediate pain at the left ankle which has since begun to swell also.  Patient did not want to come to the ED, but her daughter is concerned about the recurrent falls and encouraged her to come in for further evaluation.  Patient denies any headache, change in vision or hearing, or upper extremity numbness or weakness.  She denies any numbness in the lower extremities and reports that though leg weakness seems to have resolved.  Hospital Course:  Leg weakness, recurrent falls -Presented with left ankle pain and swelling after a fall. She was reporting bilateral leg weakness, moreso in the left than right, but has now resolved. -CT head negative for acute findings  -Telemetry neurologist evaluated patient recommended TIA work-up as well as MRI of the lumbar spine -MRI brain negative for acute infarct. -MRI lumbar spine shows progressive degenerative changes at multiple levels.  Mild spinal stenosis at L2-3.  Moderate subarticular stenosis on the left progressed at L2-3.  Moderate spinal stenosis L3-4.  Mild spinal stenosis and moderate subarticular stenosis bilaterally L4-5 with progression. -Discussed this with patient, she has followed up with Dr. Vertell Limber, neurosurgery in the past. -Carotid Doppler shows minimal heterogeneous and calcified plaque with no hemodynamically significant stenosis by duplex. -Echocardiogram EF 60-65%, no defect or PFO. Small pericardial effusion was identified.  -[id number], discussed with patient, she has refused medications and will follow up with her PCP and watch her diet -Hemoglobin A1c pending and can be followed up by PCP -Suspect patient simply had a mechanical fall. Given that patient is on Xarelto she should  be protected against TIAs, however given cancer she may be hypercoagulable. Discussed other blood thinners with patient, will continue with Xarelto for now.  -PT recommended wheelchair and bedside commode  Left distal fibula  fracture -As above, patient presented with left ankle pain and swelling after a fall -X-ray shows minimally displaced distal left fibula fracture just proximal to the ankle mortise -Neurovascularly, patient is intact -She has followed up with [facility] orthopedics previously -ED placed short leg splint with posterior and stirrup support -will discharge with hydrocodone  Carcinoma of unknown primary source -Patient has been following up with oncology at [facility].  She has declined chemotherapy and is followed with periodic imaging.  She states there is a PET/CT planned for February 2020  Hypothyroidism  -Continue Synthroid  History of DVT -Continue Xarelto  Procedures: Echocardiogram Carotid doppler  Consultations: Teleneurology  Discharge Exam: Vitals:   08/23/18 1311 08/23/18 1511  BP: 126/67 (!) 155/71  Pulse: (!) 58 69  Resp: 19 19  Temp:    SpO2: 97% 100%   No longer feels weakness in her legs. Denies current chest pain, shortness of breath, abdominal pain, N/V/D/C, dizziness, headache.    General: Well developed, well nourished, NAD, appears stated age  HEENT: NCAT, PERRLA, EOMI, Anicteic Sclera, mucous membranes moist.  Neck: Supple  Cardiovascular: S1 S2 auscultated, no rubs, murmurs or gallops. Regular rate and rhythm.  Respiratory: Clear to auscultation bilaterally with equal chest rise  Abdomen: Soft, nontender, nondistended, + bowel sounds  Extremities: warm dry without cyanosis clubbing or edema of RLE. LLE in splint.  Neuro: AAOx3, cranial nerves grossly intact. Strength 5/5 in patient's upper extremities bilaterally. RLE strength 5/5. LLE not tested due to ankle fracture.   Skin: Without rashes exudates or nodules  Psych: Normal affect and demeanor with intact judgement and insight, pleasant   Discharge Instructions Discharge Instructions    Discharge instructions   Complete by:  As directed    Patient will be discharged to home.  Patient  will need to follow up with primary care provider within one week of discharge, discuss cholesterol. Follow up with Dr. Vertell Limber, neurosurgery. Follow up with orthopedics, Dr. Doran Durand.  Patient should continue medications as prescribed.  Patient should follow a heart healthy diet.     Allergies as of 08/23/2018      Reactions   Meloxicam Other (See Comments)   "caused blood to be thin" per pt.    Propranolol Itching, Rash   Rash All over    Sulfa Antibiotics Rash      Medication List    TAKE these medications   acetaminophen 500 MG tablet Commonly known as:  TYLENOL Take 500 mg by mouth 2 (two) times daily with a meal.   CALCIUM-D PO Take 1 tablet by mouth 2 (two) times daily with a meal. Triple Strength   docusate sodium 100 MG capsule Commonly known as:  COLACE Take 100 mg by mouth 2 (two) times daily.   DULoxetine 30 MG capsule Commonly known as:  CYMBALTA Take 30 mg by mouth daily.   Fish Oil 1000 MG Caps Take 1,000 mg by mouth 2 (two) times daily with a meal.   gabapentin 300 MG capsule Commonly known as:  NEURONTIN 1 CAP IN THE MORNING, 1 CAP IN AFTERNOON AND 2 CAPS IN EVENING-CAN INCREASE TO 2 CAPS 3X DAILY What changed:  additional instructions   HYDROcodone-acetaminophen 5-325 MG tablet Commonly known as:  NORCO/VICODIN Take 1 tablet by mouth every 4 (four) hours  as needed for moderate pain.   levothyroxine 75 MCG tablet Commonly known as:  SYNTHROID, LEVOTHROID Take 75 mcg by mouth daily before breakfast.   magnesium oxide 400 MG tablet Commonly known as:  MAG-OX Take 400 mg by mouth at bedtime.   multivitamin with minerals Tabs tablet Take 1 tablet by mouth daily.   OSTEO BI-FLEX ADV JOINT SHIELD PO Take 1 capsule by mouth 2 (two) times daily.   polyethylene glycol packet Commonly known as:  MIRALAX / GLYCOLAX Take 17 g by mouth daily.   primidone 50 MG tablet Commonly known as:  MYSOLINE Take 1 tablet (50 mg total) by mouth at bedtime.    rOPINIRole 1 MG tablet Commonly known as:  REQUIP Take 0.5 tablets (0.5 mg total) by mouth every evening. 3 hours before bedtime   solifenacin 10 MG tablet Commonly known as:  VESICARE Take 10 mg by mouth daily.   SYSTANE ULTRA OP Place 1 drop into both eyes daily.   SYSTANE 0.4-0.3 % Gel ophthalmic gel Generic drug:  Polyethyl Glycol-Propyl Glycol Place 1 application into both eyes at bedtime.   XARELTO 20 MG Tabs tablet Generic drug:  rivaroxaban Take 20 mg by mouth daily with supper.            Durable Medical Equipment  (From admission, onward)         Start     Ordered   08/23/18 1506  For home use only DME Bedside commode  Once    Question:  Patient needs a bedside commode to treat with the following condition  Answer:  Closed left ankle fracture   08/23/18 1505   08/23/18 1504  For home use only DME lightweight manual wheelchair with seat cushion  Once    Comments:  Patient suffers from left ankle fracture which impairs their ability to perform daily activities in the home.  16x16   08/23/18 1505         Allergies  Allergen Reactions  . Meloxicam Other (See Comments)    "caused blood to be thin" per pt.   . Propranolol Itching and Rash    Rash All over   . Sulfa Antibiotics Rash   Follow-up Information    [name], MD. Schedule an appointment as soon as possible for a visit in 1 week(s).   Specialty:  Internal Medicine Why:  Hospital follow up  Contact information: [address] [place] [address] [phone]        Wylene Simmer, MD. Schedule an appointment as soon as possible for a visit in 1 week(s).   Specialty:  Orthopedic Surgery Why:  Hospital follow up, ankle  Contact information: [address] [place] [address] [phone]        Erline Levine, MD. Schedule an appointment as soon as possible for a visit in 1 week(s).   Specialty:  Neurosurgery Why:  Hospital follow up,  back/lumbar stenosis Contact information: [address] [place] [address] [phone]            The results of significant diagnostics from this hospitalization (including imaging, microbiology, ancillary and laboratory) are listed below for reference.    Significant Diagnostic Studies: Dg Ankle Complete Left  Result Date: 08/23/2018 CLINICAL DATA:  Fall tonight with left ankle pain. EXAM: LEFT ANKLE COMPLETE - 3+ VIEW COMPARISON:  None. FINDINGS: Minimally displaced distal fibular fracture just proximal to the ankle mortise. No significant angulation. The mortise is intact. No additional  fracture. Mild soft tissue edema. There are vascular calcifications. IMPRESSION: Minimally displaced distal fibular fracture just proximal to the ankle mortise. Electronically Signed   By: Keith Rake M.D.   On: 08/23/2018 02:12   Ct Head Wo Contrast  Result Date: 08/23/2018 CLINICAL DATA:  Weakness in left lower extremity for several hours. Fall yesterday. Dizziness and headache. EXAM: CT HEAD WITHOUT CONTRAST TECHNIQUE: Contiguous axial images were obtained from the base of the skull through the vertex without intravenous contrast. COMPARISON:  Head CT 02/18/2015 FINDINGS: Brain: Similar degree of atrophy and chronic small vessel ischemia from prior. No intracranial hemorrhage, mass effect, or midline shift. No hydrocephalus. The basilar cisterns are patent. No evidence of territorial infarct or acute ischemia. No extra-axial or intracranial fluid collection. Vascular: Atherosclerosis of skullbase vasculature without hyperdense vessel or abnormal calcification. Skull: No fracture or focal lesion. Sinuses/Orbits: Chronic opacification of left mastoid air cells with progression from prior CT. Right mastoid air cells are clear. Mild mucosal thickening of the paranasal sinuses. Bilateral cataract resection. Other: None. IMPRESSION: 1. No acute intracranial abnormality. Unchanged  degree of atrophy and chronic small vessel ischemia from prior exam. 2. Chronic opacification of left mastoid air cells with slight progression from 2016 CT. Electronically Signed   By: Keith Rake M.D.   On: 08/23/2018 02:07   Mr Brain Wo Contrast  Result Date: 08/23/2018 CLINICAL DATA:  Weakness and numbness bilaterally.  Recurrent falls. EXAM: MRI HEAD WITHOUT CONTRAST TECHNIQUE: Multiplanar, multiecho pulse sequences of the brain and surrounding structures were obtained without intravenous contrast. COMPARISON:  CT 08/23/2018.  MRI 02/18/2015 FINDINGS: Brain: Negative for acute infarct. Progressive atrophy and progressive chronic microvascular ischemic change since 2016. Negative for hemorrhage mass or fluid collection. Vascular: Normal arterial flow voids Skull and upper cervical spine: Negative Sinuses/Orbits: Complete opacification left maxillary sinus has progressed. Mild mucosal edema throughout the paranasal sinuses. Bilateral cataract surgery. Other: None IMPRESSION: Negative for acute infarct. Progression of atrophy and chronic microvascular ischemia since 2016 Chronic sinusitis. Electronically Signed   By: Franchot Gallo M.D.   On: 08/23/2018 08:49   Mr Lumbar Spine Wo Contrast  Result Date: 08/23/2018 CLINICAL DATA:  Weakness and numbness bilaterally. Recurrent falls. Lumbar fusion. EXAM: MRI LUMBAR SPINE WITHOUT CONTRAST TECHNIQUE: Multiplanar, multisequence MR imaging of the lumbar spine was performed. No intravenous contrast was administered. COMPARISON:  CT abdomen pelvis 03/22/2018.  Lumbar MRI 11/15/2008 FINDINGS: Segmentation:  Normal Alignment: Mild retrolisthesis L1-2, L2-3, L3-4. 4 mm anterolisthesis L5-S1 unchanged. Mild dextroscoliosis. Vertebrae: Chronic compression fracture of L2 is unchanged. Negative for acute fracture or mass. Conus medullaris and cauda equina: Conus extends to the L1-2 level. Conus and cauda equina appear normal. Paraspinal and other soft tissues:  Negative for paraspinous mass or fluid collection. Disc levels: T12-L1: Mild disc degeneration and disc bulging without disc protrusion or stenosis. L1-2: Mild retrolisthesis. Disc degeneration and spurring left greater than right. Mild subarticular stenosis on the left. Mild facet degeneration bilaterally. L2-3: Moderate subarticular stenosis on the left due to spurring. Bilateral facet hypertrophy. Mild spinal stenosis with progression since the prior study. L3-4: Mild retrolisthesis. Disc bulging and spurring with moderate facet degeneration. Moderate spinal stenosis. Progressive stenosis and degenerative change since the prior study. L4-5: Disc bulging and facet degeneration. Mild spinal stenosis. Moderate subarticular stenosis bilaterally. Mild progression since 2009 L5-S1: Interval pedicle screw and interbody fusion. 5 mm anterolisthesis. Mild foraminal stenosis bilaterally. IMPRESSION: Since the prior MRI of 11/15/2008, there has been interval fusion at L5-S1. Progressive degenerative  changes at multiple levels as above. Mild spinal stenosis L2-3 has progressed. Moderate subarticular stenosis on the left has progressed at L2-3 Moderate spinal stenosis L3-4 has progressed. Mild spinal stenosis and moderate subarticular stenosis bilaterally L4-5 with progression. Electronically Signed   By: Franchot Gallo M.D.   On: 08/23/2018 09:24   US Carotid Bilateral (at [facility] And [facility] Only)  Result Date: 08/23/2018 CLINICAL DATA:  84 year old female with a history of leg weakness and fall. Cardiovascular risk factors listed are none EXAM: BILATERAL CAROTID DUPLEX ULTRASOUND TECHNIQUE: Pearline Cables scale imaging, color Doppler and duplex ultrasound were performed of bilateral carotid and vertebral arteries in the neck. COMPARISON:  None. FINDINGS: Criteria: Quantification of carotid stenosis is based on velocity parameters that correlate the residual internal carotid diameter with NASCET-based stenosis levels, using the diameter  of the distal internal carotid lumen as the denominator for stenosis measurement. The following velocity measurements were obtained: RIGHT ICA:  Systolic 67 cm/sec, Diastolic 14 cm/sec CCA:  43 cm/sec SYSTOLIC ICA/CCA RATIO:  1.5 ECA:  105 cm/sec LEFT ICA:  Systolic 235 cm/sec, Diastolic 23 cm/sec CCA:  53 cm/sec SYSTOLIC ICA/CCA RATIO:  1.1 ECA:  77 cm/sec Right Brachial SBP: Not acquired Left Brachial SBP: Not acquired RIGHT CAROTID ARTERY: No significant calcifications of the right common carotid artery. Intermediate waveform maintained. Heterogeneous and partially calcified plaque at the right carotid bifurcation. No significant lumen shadowing. Low resistance waveform of the right ICA. Tortuosity. RIGHT VERTEBRAL ARTERY: Antegrade flow with low resistance waveform. LEFT CAROTID ARTERY: No significant calcifications of the left common carotid artery. Intermediate waveform maintained. Heterogeneous and partially calcified plaque at the left carotid bifurcation without significant lumen shadowing. Low resistance waveform of the left ICA. Tortuosity. LEFT VERTEBRAL ARTERY:  Antegrade flow with low resistance waveform. IMPRESSION: Color duplex indicates minimal heterogeneous and calcified plaque, with no hemodynamically significant stenosis by duplex criteria in the extracranial cerebrovascular circulation. Signed, Dulcy Fanny. Dellia Nims, RPVI [facility] Electronically Signed   By: Corrie Mckusick D.O.   On: 08/23/2018 09:21    Microbiology: No results found for this or any previous visit (from the past 240 hour(s)).   Labs: Basic Metabolic Panel: Recent Labs  Lab 08/23/18 0239  NA 139  K 3.4*  CL 106  CO2 26  GLUCOSE 105*  BUN 24*  CREATININE 0.63  CALCIUM 9.8   Liver Function Tests: Recent Labs  Lab 08/23/18 0239  AST 33  ALT 27  ALKPHOS 82  BILITOT 0.6  PROT 6.4*  ALBUMIN 3.5   No results for input(s): LIPASE, AMYLASE in the  last 168 hours. No results for input(s): AMMONIA in the last 168 hours. CBC: Recent Labs  Lab 08/23/18 0239  WBC 4.8  NEUTROABS 3.8  HGB 11.1*  HCT 34.6*  MCV 98.9  PLT 215   Cardiac Enzymes: Recent Labs  Lab 08/23/18 0239  TROPONINI <0.03   BNP: BNP (last 3 results) No results for input(s): BNP in the last 8760 hours.  ProBNP (last 3 results) No results for input(s): PROBNP in the last 8760 hours.  CBG: No results for input(s): GLUCAP in the last 168 hours.     Signed:  Cristal Ford  [facility] 08/23/2018, 4:05 PM

## 2018-08-23 NOTE — H&P (Signed)
History and Physical    [name] TDV:[id number] DOB: 1928/06/16 DOA: 08/23/2018  PCP: [name], MD   Patient coming from: Home   Chief Complaint: Recurrent falls, left ankle pain and swelling, leg weakness  HPI: Dana Klein is a 84 y.o. female with medical history significant for history of DVT on Xarelto, carcinoma with unknown primary, hypothyroidism, and tremor, now presenting to the emergency department for evaluation of recurrent falls, leg weakness, and left ankle pain and swelling.  Patient is accompanied by her daughter who assist with the history.  Patient reportedly fell approximately 1 week ago while doing yard work, reports that she was gathering peaches, piling them onto a sheet, and then attempting to drag that she follow peaches across her yard when she fell and has been experiencing pain in her upper back on the left since that time.  She has also had low back pain that has been unchanged.  And yesterday evening, she was raking her yard, describes reaching the right got up too far, losing balance, and falling.  She reports being too weak in both of her legs to get up.  She was eventually able to get into the house with the help of her daughter, but later fell again, twisting her left ankle, and experiencing immediate pain at the left ankle which has since begun to swell also.  Patient did not want to come to the ED, but her daughter is concerned about the recurrent falls and encouraged her to come in for further evaluation.  Patient denies any headache, change in vision or hearing, or upper extremity numbness or weakness.  She denies any numbness in the lower extremities and reports that though leg weakness seems to have resolved.  ED Course: Upon arrival to the ED, patient is found to be afebrile, saturating well on room air, and hypertensive to 190/90.  EKG features a sinus rhythm, noncontrast head CT is negative for acute intracranial abnormality, and radiographs  of the left leg demonstrate a minimally displaced distal left fibular fracture.  Chemistry panel features a slight hypokalemia and CBC is notable for a mild chronic normocytic anemia.  Troponin is undetectable and urinalysis unremarkable.  Patient was placed in an ankle splint.  Tele-neurology was consulted by the ED physician and recommends further evaluation of possible TIA.  Review of Systems:  All other systems reviewed and apart from HPI, are negative.  Past Medical History:  Diagnosis Date  . Back pain   . Bladder incontinence   . DVT (deep venous thrombosis) ([place]) 11/2017  . Hearing loss   . Leg numbness   . Numbness of foot   . Osteoarthritis   . Thyroid disease     Past Surgical History:  Procedure Laterality Date  . ABDOMINAL HYSTERECTOMY    . BACK SURGERY    . CARPAL TUNNEL RELEASE    . Left Wrist Surgery       reports that she has never smoked. She has never used smokeless tobacco. She reports that she does not drink alcohol or use drugs.  Allergies  Allergen Reactions  . Meloxicam Other (See Comments)    "caused blood to be thin" per pt.   . Propranolol Itching and Rash    Rash All over   . Sulfa Antibiotics Rash    Family History  Problem Relation Age of Onset  . Lung cancer Mother   . Colon cancer Mother   . Pneumonia Father   . Heart defect Father   .  Diabetes Sister   . Diabetes Brother   . Diabetes Sister   . Diabetes Brother      Prior to Admission medications   Medication Sig Start Date End Date Taking? Authorizing Provider  acetaminophen (TYLENOL) 500 MG tablet Take 500 mg by mouth 2 (two) times daily with a meal.    [provider]  Calcium Carbonate-Vitamin D (CALCIUM-D PO) Take 1 tablet by mouth 2 (two) times daily with a meal. Triple Strength    [provider]  docusate sodium (COLACE) 100 MG capsule Take 100 mg by mouth 2 (two) times daily.    [provider]  DULoxetine (CYMBALTA) 30 MG capsule Take 30 mg by  mouth daily. 12/20/14   [provider]  gabapentin (NEURONTIN) 300 MG capsule 1 CAP IN THE MORNING, 1 CAP IN AFTERNOON AND 2 CAPS IN EVENING-CAN INCREASE TO 2 CAPS 3X DAILY Patient taking differently: 1 CAP IN THE MORNING, 1 CAP IN AFTERNOON AND 2 CAPS IN EVENING 11/24/17   Melvenia Beam, MD  levothyroxine (SYNTHROID, LEVOTHROID) 75 MCG tablet Take 75 mcg by mouth daily before breakfast.  12/07/16   [provider]  magnesium oxide (MAG-OX) 400 MG tablet Take 400 mg by mouth at bedtime.     [provider]  Misc Natural Products (OSTEO BI-FLEX ADV JOINT SHIELD PO) Take 1 capsule by mouth 2 (two) times daily.     [provider]  Multiple Vitamin (MULTIVITAMIN WITH MINERALS) TABS tablet Take 1 tablet by mouth daily.    [provider]  Omega-3 Fatty Acids (FISH OIL) 1000 MG CAPS Take 1,000 mg by mouth 2 (two) times daily with a meal.    [provider]  Polyethyl Glycol-Propyl Glycol (SYSTANE ULTRA OP) Place 1 drop into both eyes daily.    [provider]  Polyethyl Glycol-Propyl Glycol (SYSTANE) 0.4-0.3 % GEL ophthalmic gel Place 1 application into both eyes at bedtime.    [provider]  polyethylene glycol (MIRALAX / GLYCOLAX) packet Take 17 g by mouth daily. 03/23/18   [name], NP  primidone (MYSOLINE) 50 MG tablet Take 1 tablet (50 mg total) by mouth at bedtime. 11/24/17   Melvenia Beam, MD  rivaroxaban (XARELTO) 20 MG TABS tablet Take 20 mg by mouth daily with supper.    [provider]  rOPINIRole (REQUIP) 1 MG tablet Take 0.5 tablets (0.5 mg total) by mouth every evening. 3 hours before bedtime 11/24/17   Melvenia Beam, MD  solifenacin (VESICARE) 10 MG tablet Take 10 mg by mouth daily.     [provider]    Physical Exam: Vitals:   08/23/18 0028 08/23/18 0300 08/23/18 0313 08/23/18 0330  BP:   (!) 149/67 (!) 168/78  Pulse:  70 73   Resp:  13 17 17   Temp:      TempSrc:      SpO2:  100% 97%     Weight: 47.2 kg     Height: 5' (1.524 m)        Constitutional: NAD, calm  Eyes: PERTLA, lids and conjunctivae normal ENMT: Mucous membranes are moist. Posterior pharynx clear of any exudate or lesions.   Neck: normal, supple, no masses, no thyromegaly Respiratory: clear to auscultation bilaterally, no wheezing, no crackles. Normal respiratory effort.    Cardiovascular: S1 & S2 heard, regular rate and rhythm. No significant JVD. Abdomen: No distension, no tenderness, soft. Bowel sounds normal.  Musculoskeletal: no clubbing / cyanosis. Left lower leg immobilized,  neurvascularly intact distally.  Skin: no significant rashes, lesions, ulcers. Warm, dry, well-perfused. Neurologic: CN 2-12 grossly intact. Sensation intact. Strength 5/5 in all 4 limbs. Resting tremor involving RUE and perioral face.  Psychiatric: Alert and oriented x 3. Pleasant and cooperative.     Labs on Admission: I have personally reviewed following labs and imaging studies  CBC: Recent Labs  Lab 08/23/18 0239  WBC 4.8  NEUTROABS 3.8  HGB 11.1*  HCT 34.6*  MCV 98.9  PLT 734   Basic Metabolic Panel: Recent Labs  Lab 08/23/18 0239  NA 139  K 3.4*  CL 106  CO2 26  GLUCOSE 105*  BUN 24*  CREATININE 0.63  CALCIUM 9.8   GFR: Estimated Creatinine Clearance: 34.9 mL/min (by C-G formula based on SCr of 0.63 mg/dL). Liver Function Tests: Recent Labs  Lab 08/23/18 0239  AST 33  ALT 27  ALKPHOS 82  BILITOT 0.6  PROT 6.4*  ALBUMIN 3.5   No results for input(s): LIPASE, AMYLASE in the last 168 hours. No results for input(s): AMMONIA in the last 168 hours. Coagulation Profile: No results for input(s): INR, PROTIME in the last 168 hours. Cardiac Enzymes: Recent Labs  Lab 08/23/18 0239  TROPONINI <0.03   BNP (last 3 results) No results for input(s): PROBNP in the last 8760 hours. HbA1C: No results for input(s): HGBA1C in the last 72 hours. CBG: No results for input(s): GLUCAP in the last 168  hours. Lipid Profile: No results for input(s): CHOL, HDL, LDLCALC, TRIG, CHOLHDL, LDLDIRECT in the last 72 hours. Thyroid Function Tests: No results for input(s): TSH, T4TOTAL, FREET4, T3FREE, THYROIDAB in the last 72 hours. Anemia Panel: No results for input(s): VITAMINB12, FOLATE, FERRITIN, TIBC, IRON, RETICCTPCT in the last 72 hours. Urine analysis:    Component Value Date/Time   COLORURINE STRAW (A) 08/23/2018 0225   APPEARANCEUR CLEAR 08/23/2018 0225   LABSPEC 1.010 08/23/2018 0225   PHURINE 7.0 08/23/2018 0225   GLUCOSEU NEGATIVE 08/23/2018 0225   HGBUR NEGATIVE 08/23/2018 0225   BILIRUBINUR NEGATIVE 08/23/2018 0225   KETONESUR NEGATIVE 08/23/2018 0225   PROTEINUR NEGATIVE 08/23/2018 0225   NITRITE NEGATIVE 08/23/2018 0225   LEUKOCYTESUR NEGATIVE 08/23/2018 0225   Sepsis Labs: @LABRCNTIP (procalcitonin:4,lacticidven:4) )No results found for this or any previous visit (from the past 240 hour(s)).   Radiological Exams on Admission: Dg Ankle Complete Left  Result Date: 08/23/2018 CLINICAL DATA:  Fall tonight with left ankle pain. EXAM: LEFT ANKLE COMPLETE - 3+ VIEW COMPARISON:  None. FINDINGS: Minimally displaced distal fibular fracture just proximal to the ankle mortise. No significant angulation. The mortise is intact. No additional fracture. Mild soft tissue edema. There are vascular calcifications. IMPRESSION: Minimally displaced distal fibular fracture just proximal to the ankle mortise. Electronically Signed   By: Keith Rake M.D.   On: 08/23/2018 02:12   Ct Head Wo Contrast  Result Date: 08/23/2018 CLINICAL DATA:  Weakness in left lower extremity for several hours. Fall yesterday. Dizziness and headache. EXAM: CT HEAD WITHOUT CONTRAST TECHNIQUE: Contiguous axial images were obtained from the base of the skull through the vertex without intravenous contrast. COMPARISON:  Head CT 02/18/2015 FINDINGS: Brain: Similar degree of atrophy and chronic small vessel ischemia from  prior. No intracranial hemorrhage, mass effect, or midline shift. No hydrocephalus. The basilar cisterns are patent. No evidence of territorial infarct or acute ischemia. No extra-axial or intracranial fluid collection. Vascular: Atherosclerosis of skullbase vasculature without hyperdense vessel or abnormal calcification. Skull: No fracture or focal lesion. Sinuses/Orbits: Chronic  opacification of left mastoid air cells with progression from prior CT. Right mastoid air cells are clear. Mild mucosal thickening of the paranasal sinuses. Bilateral cataract resection. Other: None. IMPRESSION: 1. No acute intracranial abnormality. Unchanged degree of atrophy and chronic small vessel ischemia from prior exam. 2. Chronic opacification of left mastoid air cells with slight progression from 2016 CT. Electronically Signed   By: Keith Rake M.D.   On: 08/23/2018 02:07    EKG: Independently reviewed. Sinus rhythm.   Assessment/Plan   1. Leg weakness, recurrent falls  - Presents with left ankle pain and swelling after a fall, and reports that she had been experiencing bilateral leg weakness that has now resolved  - CT head is negative for acute findings and no definite focal deficits on exam, though limited by distal left fibula fracture  - Teleneurologist evaluated patient and recommends TIA workup and MRI lumbar spine   - Continue cardiac monitoring, frequent neuro checks, PT/OT evals  - Check MRI brain, MRI L-spine, echocardiogram, carotid US, fasting lipids, and A1c    2. Distal left fibula fracture  - Presents with left ankle pain and swelling after a fall  - Found to have minimally displaced distal left fibula fracture  - She is neurovascularly intact and the ankle has been immobilized  - She has been seen by [employer] [facility] previously  - Continue supportive care    3. Carcinoma of unknown primary  - Follows with [facility]ology at [facility], has declined chemotherapy and is followed with periodic  imaging, planned for repeat PET-CT in February 2020     4. Hypothyroidism  - Continue Synthroid    5. History of DVT  - Continue Xarelto    DVT prophylaxis: Xarelto  Code Status: Full  Family Communication: Daughter updated at bedside Consults called: Teleneurology Admission status: Observation     Vianne Bulls, MD [facility] Pager [phone]  If 7PM-7AM, please contact night-coverage [email or web address] Password [email or web address]  08/23/2018, 4:07 AM

## 2018-08-23 NOTE — ED Provider Notes (Signed)
[facility] EMERGENCY DEPARTMENT Provider Note   CSN: [id number] Arrival date & time: 08/23/18  0002   Time seen 12:40 AM  History   Chief Complaint Chief Complaint  Patient presents with  . Fall    HPI Dana Klein is a 84 y.o. female.  HPI patient is here for daughter.  She states this afternoon patient was working outside [employer] for several hours.  About 5 PM she states she was reaching out with her rake and she fell and could not get up.  She states she tried multiple times and finally ended up scooting into the carport.  At this point her daughter who was in the front yard found her.  She scooted into the house and at some point was able to get up with her walker however she stated her left leg was still weak.  She states around 9 PM she had gone out onto her porch and when she tried to step up of her left leg to go back in the house she fell and this time hurt her left ankle when her foot went underneath her body.  She states she put ice packs on it for 25 minutes.  She states her leg no longer feels weak.  She denies any numbness or weakness of her upper extremities.  Her daughter did not notice any slurred speech or asymmetry of her face.  Patient states a week ago she fell bringing pairs into the house and she has pain on her left posterior rib cage.  She was seen by her primary care doctor that day and had a chest x-ray done which was negative.  She is denies having pleuritic chest pain and states her pain is improving.  Patient states she has some cancerous lymph nodes in her abdomen and on her neck that was biopsied.  She states the etiology of the cancer has not been found.  She states the oncologist stated they could do chemotherapy however he did not feel like it was indicated at this time unless she wanted to do it.  She states if she needs chemotherapy she is agreeable to have it done.  Looking at Dr. Alen Blew last note in January.  He states she was diagnosed  with a metastatic carcinoma of unknown primary in December 2016 when she presented with retroperitoneal adenopathy.  He states she is on active surveillance.  Her PET exams have shown stable disease.  Also in January she had DVT in her left leg.  At that time he recommended she stay on Xarelto for 6 months.  When he saw her in April however he felt like she should stay on the Xarelto for now.  PCP [name], MD Neurology Dr Lavell Anchors Oncology Dr Alen Blew  Past Medical History:  Diagnosis Date  . Back pain   . Bladder incontinence   . DVT (deep venous thrombosis) ([place]) 11/2017  . Hearing loss   . Leg numbness   . Numbness of foot   . Osteoarthritis   . Thyroid disease     Patient Active Problem List   Diagnosis Date Noted  . Left leg weakness 08/23/2018  . Closed fracture of left distal fibula 08/23/2018  . Hypothyroidism 08/23/2018  . History of DVT of lower extremity 11/20/2017  . Swelling of lower extremity 10/29/2017  . Malignant tumor of unknown origin ([place]) 10/02/2016  . Lymphadenopathy, abdominal   . Arthritis 07/22/2015  . Essential tremor 07/22/2015  . Tremor 03/08/2015  . Hereditary  and idiopathic peripheral neuropathy 03/08/2015  . Back problem 06/10/2011  . Osteoarthritis 06/10/2011  . Hearing loss 06/10/2011  . Bladder incontinence 06/10/2011    Past Surgical History:  Procedure Laterality Date  . ABDOMINAL HYSTERECTOMY    . BACK SURGERY    . CARPAL TUNNEL RELEASE    . Left Wrist Surgery       OB History   None      Home Medications    Prior to Admission medications   Medication Sig Start Date End Date Taking? Authorizing Provider  acetaminophen (TYLENOL) 500 MG tablet Take 500 mg by mouth 2 (two) times daily with a meal.    [provider]  Calcium Carbonate-Vitamin D (CALCIUM-D PO) Take 1 tablet by mouth 2 (two) times daily with a meal. Triple Strength    [provider]  docusate sodium (COLACE) 100 MG capsule Take 100 mg  by mouth 2 (two) times daily.    [provider]  DULoxetine (CYMBALTA) 30 MG capsule Take 30 mg by mouth daily. 12/20/14   [provider]  gabapentin (NEURONTIN) 300 MG capsule 1 CAP IN THE MORNING, 1 CAP IN AFTERNOON AND 2 CAPS IN EVENING-CAN INCREASE TO 2 CAPS 3X DAILY Patient taking differently: 1 CAP IN THE MORNING, 1 CAP IN AFTERNOON AND 2 CAPS IN EVENING 11/24/17   Melvenia Beam, MD  levothyroxine (SYNTHROID, LEVOTHROID) 75 MCG tablet Take 75 mcg by mouth daily before breakfast.  12/07/16   [provider]  magnesium oxide (MAG-OX) 400 MG tablet Take 400 mg by mouth at bedtime.     [provider]  Misc Natural Products (OSTEO BI-FLEX ADV JOINT SHIELD PO) Take 1 capsule by mouth 2 (two) times daily.     [provider]  Multiple Vitamin (MULTIVITAMIN WITH MINERALS) TABS tablet Take 1 tablet by mouth daily.    [provider]  Omega-3 Fatty Acids (FISH OIL) 1000 MG CAPS Take 1,000 mg by mouth 2 (two) times daily with a meal.    [provider]  Polyethyl Glycol-Propyl Glycol (SYSTANE ULTRA OP) Place 1 drop into both eyes daily.    [provider]  Polyethyl Glycol-Propyl Glycol (SYSTANE) 0.4-0.3 % GEL ophthalmic gel Place 1 application into both eyes at bedtime.    [provider]  polyethylene glycol (MIRALAX / GLYCOLAX) packet Take 17 g by mouth daily. 03/23/18   [name], NP  primidone (MYSOLINE) 50 MG tablet Take 1 tablet (50 mg total) by mouth at bedtime. 11/24/17   Melvenia Beam, MD  rivaroxaban (XARELTO) 20 MG TABS tablet Take 20 mg by mouth daily with supper.    [provider]  rOPINIRole (REQUIP) 1 MG tablet Take 0.5 tablets (0.5 mg total) by mouth every evening. 3 hours before bedtime 11/24/17   Melvenia Beam, MD  solifenacin (VESICARE) 10 MG tablet Take 10 mg by mouth daily.     [provider]    Family History Family History  Problem Relation Age of Onset  . Lung cancer  Mother   . Colon cancer Mother   . Pneumonia Father   . Heart defect Father   . Diabetes Sister   . Diabetes Brother   . Diabetes Sister   . Diabetes Brother     Social History Social History   Tobacco Use  . Smoking status: Never Smoker  . Smokeless tobacco: Never Used  Substance Use Topics  . Alcohol use: No    Alcohol/week: 0.0 standard drinks  .  Drug use: No  lives at home Uses a walker and cane, but not in the house Lives alone, widowed  Allergies   Meloxicam; Propranolol; and Sulfa antibiotics   Review of Systems Review of Systems  All other systems reviewed and are negative.    Physical Exam Updated Vital Signs BP (!) 192/93   Pulse 79   Temp 98.8 F (37.1 C) (Oral)   Ht 5' (1.524 m)   Wt 47.2 kg   SpO2 100%   BMI 20.31 kg/m   Vital signs normal except hypertension   Physical Exam  Constitutional: She is oriented to person, place, and time. She appears well-developed and well-nourished.  Non-toxic appearance. She does not appear ill. No distress.  HENT:  Head: Normocephalic and atraumatic.  Right Ear: External ear normal.  Left Ear: External ear normal.  Nose: Nose normal. No mucosal edema or rhinorrhea.  Mouth/Throat: Oropharynx is clear and moist and mucous membranes are normal. No dental abscesses or uvula swelling.  Eyes: Pupils are equal, round, and reactive to light. Conjunctivae and EOM are normal.  Neck: Normal range of motion and full passive range of motion without pain. Neck supple.  Cardiovascular: Normal rate, regular rhythm and normal heart sounds. Exam reveals no gallop and no friction rub.  No murmur heard. Pulmonary/Chest: Effort normal and breath sounds normal. No respiratory distress. She has no wheezes. She has no rhonchi. She has no rales. She exhibits no tenderness and no crepitus.  Abdominal: Soft. Normal appearance and bowel sounds are normal. She exhibits no distension. There is no tenderness. There is no rebound and no  guarding.  Musculoskeletal: Normal range of motion. She exhibits edema and tenderness.  Moves all extremities well.  Patient has some swelling and mild tenderness over the lateral malleolus of the left ankle.  Her left foot and left knee and lower leg are nontender.  Neurological: She is alert and oriented to person, place, and time. She has normal strength. No cranial nerve deficit.  Skin: Skin is warm, dry and intact. No rash noted. No erythema. No pallor.  Psychiatric: She has a normal mood and affect. Her speech is normal and behavior is normal. Her mood appears not anxious.  Nursing note and vitals reviewed.    ED Treatments / Results  Labs (all labs ordered are listed, but only abnormal results are displayed) Results for orders placed or performed during the hospital encounter of 08/23/18  Comprehensive metabolic panel  Result Value Ref Range   Sodium 139 135 - 145 mmol/L   Potassium 3.4 (L) 3.5 - 5.1 mmol/L   Chloride 106 98 - 111 mmol/L   CO2 26 22 - 32 mmol/L   Glucose, Bld 105 (H) 70 - 99 mg/dL   BUN 24 (H) 8 - 23 mg/dL   Creatinine, Ser 0.63 0.44 - 1.00 mg/dL   Calcium 9.8 8.9 - 10.3 mg/dL   Total Protein 6.4 (L) 6.5 - 8.1 g/dL   Albumin 3.5 3.5 - 5.0 g/dL   AST 33 15 - 41 U/L   ALT 27 0 - 44 U/L   Alkaline Phosphatase 82 38 - 126 U/L   Total Bilirubin 0.6 0.3 - 1.2 mg/dL   GFR calc non Af Amer >60 >60 mL/min   GFR calc Af Amer >60 >60 mL/min   Anion gap 7 5 - 15  CBC with Differential  Result Value Ref Range   WBC 4.8 4.0 - 10.5 K/uL   RBC 3.50 (L) 3.87 - 5.11 MIL/uL  Hemoglobin 11.1 (L) 12.0 - 15.0 g/dL   HCT 34.6 (L) 36.0 - 46.0 %   MCV 98.9 78.0 - 100.0 fL   MCH 31.7 26.0 - 34.0 pg   MCHC 32.1 30.0 - 36.0 g/dL   RDW 14.1 11.5 - 15.5 %   Platelets 215 150 - 400 K/uL   Neutrophils Relative % 80 %   Neutro Abs 3.8 1.7 - 7.7 K/uL   Lymphocytes Relative 12 %   Lymphs Abs 0.6 (L) 0.7 - 4.0 K/uL   Monocytes Relative 8 %   Monocytes Absolute 0.4 0.1 - 1.0 K/uL    Eosinophils Relative 0 %   Eosinophils Absolute 0.0 0.0 - 0.7 K/uL   Basophils Relative 0 %   Basophils Absolute 0.0 0.0 - 0.1 K/uL  Urinalysis, Routine w reflex microscopic  Result Value Ref Range   Color, Urine STRAW (A) YELLOW   APPearance CLEAR CLEAR   Specific Gravity, Urine 1.010 1.005 - 1.030   pH 7.0 5.0 - 8.0   Glucose, UA NEGATIVE NEGATIVE mg/dL   Hgb urine dipstick NEGATIVE NEGATIVE   Bilirubin Urine NEGATIVE NEGATIVE   Ketones, ur NEGATIVE NEGATIVE mg/dL   Protein, ur NEGATIVE NEGATIVE mg/dL   Nitrite NEGATIVE NEGATIVE   Leukocytes, UA NEGATIVE NEGATIVE  Troponin I  Result Value Ref Range   Troponin I <0.03 <0.03 ng/mL   Laboratory interpretation all normal except mild anemia    EKG EKG Interpretation  Date/Time:  Monday August 23 2018 01:21:40 EDT Ventricular Rate:  70 PR Interval:    QRS Duration: 86 QT Interval:  389 QTC Calculation: 420 R Axis:   57 Text Interpretation:  Sinus rhythm Consider left ventricular hypertrophy No significant change since last tracing  18 Dec 2017 Confirmed by Rolland Porter [phone]) on 08/23/2018 1:42:00 AM   Radiology Dg Ankle Complete Left  Result Date: 08/23/2018 CLINICAL DATA:  Fall tonight with left ankle pain. EXAM: LEFT ANKLE COMPLETE - 3+ VIEW COMPARISON:  None. FINDINGS: Minimally displaced distal fibular fracture just proximal to the ankle mortise. No significant angulation. The mortise is intact. No additional fracture. Mild soft tissue edema. There are vascular calcifications. IMPRESSION: Minimally displaced distal fibular fracture just proximal to the ankle mortise. Electronically Signed   By: Keith Rake M.D.   On: 08/23/2018 02:12   Ct Head Wo Contrast  Result Date: 08/23/2018 CLINICAL DATA:  Weakness in left lower extremity for several hours. Fall yesterday. Dizziness and headache. EXAM: CT HEAD WITHOUT CONTRAST TECHNIQUE: Contiguous axial images were obtained from the base of the skull through the vertex  without intravenous contrast. COMPARISON:  Head CT 02/18/2015 FINDINGS: Brain: Similar degree of atrophy and chronic small vessel ischemia from prior. No intracranial hemorrhage, mass effect, or midline shift. No hydrocephalus. The basilar cisterns are patent. No evidence of territorial infarct or acute ischemia. No extra-axial or intracranial fluid collection. Vascular: Atherosclerosis of skullbase vasculature without hyperdense vessel or abnormal calcification. Skull: No fracture or focal lesion. Sinuses/Orbits: Chronic opacification of left mastoid air cells with progression from prior CT. Right mastoid air cells are clear. Mild mucosal thickening of the paranasal sinuses. Bilateral cataract resection. Other: None. IMPRESSION: 1. No acute intracranial abnormality. Unchanged degree of atrophy and chronic small vessel ischemia from prior exam. 2. Chronic opacification of left mastoid air cells with slight progression from 2016 CT. Electronically Signed   By: Keith Rake M.D.   On: 08/23/2018 02:07    Procedures Procedures (including critical care time)  Medications Ordered in ED  Medications - No data to display   Initial Impression / Assessment and Plan / ED Course  I have reviewed the triage vital signs and the nursing notes.  Pertinent labs & imaging results that were available during my care of the patient were reviewed by me and considered in my medical decision making (see chart for details).     Concerned when I first talked to the patient with her left leg weakness is that she may be had a TIA, her symptoms have resolved now.  She had no other neurological symptoms during that time.  She also has a history of metastatic lymph nodes without the original cancer source found.   01:50 AM Teleneurology, recommends MR brain and lumbar spine, echocardiogram, TIA evaluation  3:30 AM I discussed her test results with patient and her daughter.  We looked at her x-ray.  She was placed in a  short leg splint with posterior and stirrup support.  Patient states she uses a cane and walker but not when she is in the house.  She has her husband's a wheelchair but states he was a much larger man than her and is too big.  She states she goes to [place] per orthopedics which is now [facility].  We discussed the tele-neurologist recommended she come in and get evaluated to see if she had a TIA.  Patient is reluctantly agreeable.  03:52 AM Dr Myna Hidalgo  Final Clinical Impressions(s) / ED Diagnoses   Final diagnoses:  Transient weakness of left leg  Closed fracture of distal end of left fibula, unspecified fracture morphology, initial encounter    Plan admission  Rolland Porter, MD, Barbette Or, MD 08/23/18 0400

## 2018-08-23 NOTE — Progress Notes (Signed)
IV removed, WNL. D/C instructions given to pt. Verbalized understanding. Pt sister at bedside to transport patient home.

## 2018-08-23 NOTE — ED Notes (Signed)
Pt also states that she fell a few days ago pulling a sheet with pears outside hurting her left back area.

## 2018-08-24 NOTE — Progress Notes (Signed)
Pt needs HH, BSC and WC. Provider options given and pt would like [place] through [facility], aware [place] has 48 hrs to make first visit, Meredeth Ide rep, given referral. Pt would like WC from [facility]. Vaughan Basta, [facility] rep, given referral and delivered [facility] and BSC to pt room prior to DC on 08/24/18.

## 2018-08-25 DIAGNOSIS — I10 Essential (primary) hypertension: Secondary | ICD-10-CM | POA: Diagnosis not present

## 2018-08-25 DIAGNOSIS — E039 Hypothyroidism, unspecified: Secondary | ICD-10-CM | POA: Diagnosis not present

## 2018-08-25 DIAGNOSIS — S82832A Other fracture of upper and lower end of left fibula, initial encounter for closed fracture: Secondary | ICD-10-CM | POA: Diagnosis not present

## 2018-08-25 DIAGNOSIS — F0390 Unspecified dementia without behavioral disturbance: Secondary | ICD-10-CM | POA: Diagnosis not present

## 2018-08-25 DIAGNOSIS — S8265XA Nondisplaced fracture of lateral malleolus of left fibula, initial encounter for closed fracture: Secondary | ICD-10-CM | POA: Diagnosis not present

## 2018-08-25 DIAGNOSIS — M5136 Other intervertebral disc degeneration, lumbar region: Secondary | ICD-10-CM | POA: Diagnosis not present

## 2018-08-25 DIAGNOSIS — R296 Repeated falls: Secondary | ICD-10-CM | POA: Diagnosis not present

## 2018-08-25 DIAGNOSIS — M79605 Pain in left leg: Secondary | ICD-10-CM | POA: Diagnosis not present

## 2018-08-25 DIAGNOSIS — M25572 Pain in left ankle and joints of left foot: Secondary | ICD-10-CM | POA: Diagnosis not present

## 2018-08-25 DIAGNOSIS — R29898 Other symptoms and signs involving the musculoskeletal system: Secondary | ICD-10-CM | POA: Diagnosis not present

## 2018-08-26 ENCOUNTER — Ambulatory Visit: Payer: PPO | Admitting: Oncology

## 2018-08-26 DIAGNOSIS — R262 Difficulty in walking, not elsewhere classified: Secondary | ICD-10-CM | POA: Diagnosis not present

## 2018-08-26 DIAGNOSIS — S82402A Unspecified fracture of shaft of left fibula, initial encounter for closed fracture: Secondary | ICD-10-CM | POA: Diagnosis not present

## 2018-08-30 DIAGNOSIS — E039 Hypothyroidism, unspecified: Secondary | ICD-10-CM | POA: Diagnosis not present

## 2018-08-30 DIAGNOSIS — C801 Malignant (primary) neoplasm, unspecified: Secondary | ICD-10-CM | POA: Diagnosis not present

## 2018-08-30 DIAGNOSIS — Z86718 Personal history of other venous thrombosis and embolism: Secondary | ICD-10-CM | POA: Diagnosis not present

## 2018-08-30 DIAGNOSIS — M48061 Spinal stenosis, lumbar region without neurogenic claudication: Secondary | ICD-10-CM | POA: Diagnosis not present

## 2018-08-30 DIAGNOSIS — S82402D Unspecified fracture of shaft of left fibula, subsequent encounter for closed fracture with routine healing: Secondary | ICD-10-CM | POA: Diagnosis not present

## 2018-09-02 DIAGNOSIS — S8265XA Nondisplaced fracture of lateral malleolus of left fibula, initial encounter for closed fracture: Secondary | ICD-10-CM | POA: Diagnosis not present

## 2018-09-02 DIAGNOSIS — M25572 Pain in left ankle and joints of left foot: Secondary | ICD-10-CM | POA: Diagnosis not present

## 2018-09-03 DIAGNOSIS — C801 Malignant (primary) neoplasm, unspecified: Secondary | ICD-10-CM | POA: Diagnosis not present

## 2018-09-03 DIAGNOSIS — S82402D Unspecified fracture of shaft of left fibula, subsequent encounter for closed fracture with routine healing: Secondary | ICD-10-CM | POA: Diagnosis not present

## 2018-09-03 DIAGNOSIS — M48061 Spinal stenosis, lumbar region without neurogenic claudication: Secondary | ICD-10-CM | POA: Diagnosis not present

## 2018-09-03 DIAGNOSIS — E039 Hypothyroidism, unspecified: Secondary | ICD-10-CM | POA: Diagnosis not present

## 2018-09-06 DIAGNOSIS — Z86718 Personal history of other venous thrombosis and embolism: Secondary | ICD-10-CM | POA: Diagnosis not present

## 2018-09-06 DIAGNOSIS — E039 Hypothyroidism, unspecified: Secondary | ICD-10-CM | POA: Diagnosis not present

## 2018-09-06 DIAGNOSIS — C801 Malignant (primary) neoplasm, unspecified: Secondary | ICD-10-CM | POA: Diagnosis not present

## 2018-09-06 DIAGNOSIS — M48061 Spinal stenosis, lumbar region without neurogenic claudication: Secondary | ICD-10-CM | POA: Diagnosis not present

## 2018-09-06 DIAGNOSIS — S82402D Unspecified fracture of shaft of left fibula, subsequent encounter for closed fracture with routine healing: Secondary | ICD-10-CM | POA: Diagnosis not present

## 2018-09-10 DIAGNOSIS — M25572 Pain in left ankle and joints of left foot: Secondary | ICD-10-CM | POA: Diagnosis not present

## 2018-09-10 DIAGNOSIS — S8264XD Nondisplaced fracture of lateral malleolus of right fibula, subsequent encounter for closed fracture with routine healing: Secondary | ICD-10-CM | POA: Diagnosis not present

## 2018-09-13 DIAGNOSIS — E039 Hypothyroidism, unspecified: Secondary | ICD-10-CM | POA: Diagnosis not present

## 2018-09-13 DIAGNOSIS — M48061 Spinal stenosis, lumbar region without neurogenic claudication: Secondary | ICD-10-CM | POA: Diagnosis not present

## 2018-09-13 DIAGNOSIS — S82402D Unspecified fracture of shaft of left fibula, subsequent encounter for closed fracture with routine healing: Secondary | ICD-10-CM | POA: Diagnosis not present

## 2018-09-13 DIAGNOSIS — Z86718 Personal history of other venous thrombosis and embolism: Secondary | ICD-10-CM | POA: Diagnosis not present

## 2018-09-13 DIAGNOSIS — C801 Malignant (primary) neoplasm, unspecified: Secondary | ICD-10-CM | POA: Diagnosis not present

## 2018-09-14 ENCOUNTER — Telehealth: Payer: Self-pay

## 2018-09-14 ENCOUNTER — Inpatient Hospital Stay: Payer: PPO | Attending: Oncology | Admitting: Oncology

## 2018-09-14 VITALS — BP 166/73 | HR 71 | Temp 97.7°F | Resp 17 | Ht 60.0 in | Wt 107.9 lb

## 2018-09-14 DIAGNOSIS — C801 Malignant (primary) neoplasm, unspecified: Secondary | ICD-10-CM | POA: Insufficient documentation

## 2018-09-14 DIAGNOSIS — M545 Low back pain: Secondary | ICD-10-CM | POA: Diagnosis not present

## 2018-09-14 DIAGNOSIS — M546 Pain in thoracic spine: Secondary | ICD-10-CM | POA: Diagnosis not present

## 2018-09-14 DIAGNOSIS — R590 Localized enlarged lymph nodes: Secondary | ICD-10-CM | POA: Diagnosis not present

## 2018-09-14 DIAGNOSIS — Z7901 Long term (current) use of anticoagulants: Secondary | ICD-10-CM | POA: Insufficient documentation

## 2018-09-14 DIAGNOSIS — I82402 Acute embolism and thrombosis of unspecified deep veins of left lower extremity: Secondary | ICD-10-CM

## 2018-09-14 NOTE — Progress Notes (Signed)
Hematology and Oncology Follow Up Visit  Dana Klein [id number] 08/26/1928 84 y.o. 09/14/2018 3:00 PM Dana Klein, MDRamachandran, Ajith, MD   Principle Diagnosis: 84 year old woman with stage IV carcinoma of unknown primary with lymphadenopathy diagnosed in 2016.     Current therapy: Active surveillance.  Interim History: Dana Klein returns today for repeat evaluation.  Since her last visit, she reports no major changes or complaints.  She did sustain a fall and suffered a left ankle injury but did not have any need for an operation at this time.  She remains on Xarelto without any active bleeding or bruising.  She ambulates with the help of a walker without any falls or syncope.  Her appetite remain excellent and her activity level remains unchanged.  She denies any abdominal distention or palpable adenopathy.  She does not report any headaches, blurry vision, or seizures.  He denies any alteration mental status or confusion.  She does not report any fevers, chills or sweats. Does not report any cough, wheezing, hemoptysis. Does not report any chest pain, palpitation, orthopnea.. Does not report any nausea, vomiting or abdominal pain. Does not report any changes in his bowels.  She does not report any frequency urgency or hesitancy.  Does not report any skin rashes or lesions.  She denied bleeding or clotting tendency. Remaining review of systems is negative.   Medications: I have reviewed the patient's current medications.  Current Outpatient Medications  Medication Sig Dispense Refill  . acetaminophen (TYLENOL) 500 MG tablet Take 500 mg by mouth 2 (two) times daily with a meal.    . Calcium Carbonate-Vitamin D (CALCIUM-D PO) Take 1 tablet by mouth 2 (two) times daily with a meal. Triple Strength    . docusate sodium (COLACE) 100 MG capsule Take 100 mg by mouth 2 (two) times daily.    Marland Kitchen donepezil (ARICEPT) 10 MG tablet Take 10 mg by mouth at bedtime.    Marland Kitchen doxycycline (VIBRA-TABS)  100 MG tablet Take 100 mg by mouth 2 (two) times daily. 7 day course starting on 08/17/2018  0  . DULoxetine (CYMBALTA) 30 MG capsule Take 30 mg by mouth every morning.   12  . gabapentin (NEURONTIN) 300 MG capsule 1 CAP IN THE MORNING, 1 CAP IN AFTERNOON AND 2 CAPS IN EVENING-CAN INCREASE TO 2 CAPS 3X DAILY (Patient taking differently: Take 300-600 mg by mouth See admin instructions. 1 CAP ('300mg'$  total) IN THE MORNING, 1 CAP ('300mg'$  total) IN AFTERNOON AND 2 CAPS ('600mg'$  total) IN EVENING) 180 capsule 6  . HYDROcodone-acetaminophen (NORCO/VICODIN) 5-325 MG tablet Take 1 tablet by mouth every 4 (four) hours as needed for moderate pain. 30 tablet 0  . levothyroxine (SYNTHROID, LEVOTHROID) 75 MCG tablet Take 75 mcg by mouth daily before breakfast.     . magnesium oxide (MAG-OX) 400 MG tablet Take 400 mg by mouth at bedtime.     . Misc Natural Products (OSTEO BI-FLEX ADV JOINT SHIELD PO) Take 1 capsule by mouth 2 (two) times daily.     . Multiple Vitamin (MULTIVITAMIN WITH MINERALS) TABS tablet Take 1 tablet by mouth daily.    . Omega-3 Fatty Acids (FISH OIL) 1000 MG CAPS Take 1,000 mg by mouth 2 (two) times daily with a meal.    . pantoprazole (PROTONIX) 40 MG tablet Take 40 mg by mouth daily.  3  . Polyethyl Glycol-Propyl Glycol (SYSTANE ULTRA OP) Place 1 drop into both eyes daily.    Dana Klein Glycol-Propyl Glycol (SYSTANE) 0.4-0.3 % GEL  ophthalmic gel Place 1 application into both eyes at bedtime.    . polyethylene glycol (MIRALAX / GLYCOLAX) packet Take 17 g by mouth daily. 14 each 0  . primidone (MYSOLINE) 50 MG tablet Take 1 tablet (50 mg total) by mouth at bedtime. 30 tablet 11  . rivaroxaban (XARELTO) 20 MG TABS tablet Take 20 mg by mouth daily with supper.    Marland Kitchen rOPINIRole (REQUIP) 1 MG tablet Take 0.5 tablets (0.5 mg total) by mouth every evening. 3 hours before bedtime 90 tablet 6  . tolterodine (DETROL LA) 4 MG 24 hr capsule Take 4 mg by mouth at bedtime.  6   No current  facility-administered medications for this visit.      Allergies:  Allergies  Allergen Reactions  . Meloxicam Other (See Comments)    "caused blood to be thin" per pt.   . Propranolol Itching and Rash    Rash All over   . Sulfa Antibiotics Rash    Past Medical History, Surgical history, Social history, and Family History were reviewed and updated.   Physical Exam: Blood pressure (!) 166/73, pulse 71, temperature 97.7 F (36.5 C), temperature source Oral, resp. rate 17, height 5' (1.524 m), weight 107 lb 14.4 oz (48.9 kg), SpO2 96 %.   ECOG: 1     General appearance: Comfortable appearing without any discomfort Head: Normocephalic without any trauma Oropharynx: Mucous membranes are moist and pink without any thrush or ulcers. Eyes: Pupils are equal and round reactive to light. Lymph nodes: No cervical, supraclavicular, inguinal or axillary lymphadenopathy.   Heart:regular rate and rhythm.  S1 and S2 without leg edema. Lung: Clear without any rhonchi or wheezes.  No dullness to percussion. Abdomin: Soft, nontender, nondistended with good bowel sounds.  No hepatosplenomegaly. Musculoskeletal: No joint deformity or effusion.  Full range of motion noted. Neurological: No deficits noted on motor, sensory and deep tendon reflex exam. Skin: No petechial rash or dryness.  Appeared moist.    Lab Results: Lab Results  Component Value Date   WBC 4.8 08/23/2018   HGB 11.1 (L) 08/23/2018   HCT 34.6 (L) 08/23/2018   MCV 98.9 08/23/2018   PLT 215 08/23/2018     Chemistry      Component Value Date/Time   NA 139 08/23/2018 0239   NA 142 08/26/2017 1423   K 3.4 (L) 08/23/2018 0239   K 4.2 08/26/2017 1423   CL 106 08/23/2018 0239   CO2 26 08/23/2018 0239   CO2 27 08/26/2017 1423   BUN 24 (H) 08/23/2018 0239   BUN 20.8 08/26/2017 1423   CREATININE 0.63 08/23/2018 0239   CREATININE 0.7 08/26/2017 1423      Component Value Date/Time   CALCIUM 9.8 08/23/2018 0239   CALCIUM  10.1 08/26/2017 1423   ALKPHOS 82 08/23/2018 0239   ALKPHOS 94 08/26/2017 1423   AST 33 08/23/2018 0239   AST 24 08/26/2017 1423   ALT 27 08/23/2018 0239   ALT 17 08/26/2017 1423   BILITOT 0.6 08/23/2018 0239   BILITOT 0.33 08/26/2017 1423     [facility] report: Microsatellite status and tumor mutation burden could not be determined. The following mutations were identified: [id number] R479P, KDM6A [id number], [id number]       Impression and Plan:  84 year old woman with:   1.  Stage IV carcinoma of unknown primary diagnosed in 2016 with diffuse lymphadenopathy.   She has been on active surveillance since that time without any need for treatment at this time.  Her disease has been progressing very slowly and remains completely asymptomatic.  Her last PET scan obtained in August 2019 at [facility] was reviewed and continues to support these findings.  Rationale for starting systemic chemotherapy versus active surveillance was debated today.  For the time being we will continue with active surveillance and repeat examination in 6 months.  2.  Deep vein thrombosis of the left lower extremity: Remains on Xarelto without any issues.  3. Follow-up: Will be in 6 months.  15 minutes was spent face-to-face with the patient today.  More than 50% of the time was spent on reviewing the natural course of her disease, treatment options and coordinating plan of care.  Zola Button, MD 10/29/20193:00 PM

## 2018-09-14 NOTE — Telephone Encounter (Signed)
Printed avs and calender of upcoming appointment. Per 10/29 los 

## 2018-09-20 DIAGNOSIS — M48061 Spinal stenosis, lumbar region without neurogenic claudication: Secondary | ICD-10-CM | POA: Diagnosis not present

## 2018-09-20 DIAGNOSIS — C801 Malignant (primary) neoplasm, unspecified: Secondary | ICD-10-CM | POA: Diagnosis not present

## 2018-09-20 DIAGNOSIS — S82402D Unspecified fracture of shaft of left fibula, subsequent encounter for closed fracture with routine healing: Secondary | ICD-10-CM | POA: Diagnosis not present

## 2018-09-20 DIAGNOSIS — Z86718 Personal history of other venous thrombosis and embolism: Secondary | ICD-10-CM | POA: Diagnosis not present

## 2018-09-20 DIAGNOSIS — M546 Pain in thoracic spine: Secondary | ICD-10-CM | POA: Diagnosis not present

## 2018-09-20 DIAGNOSIS — E039 Hypothyroidism, unspecified: Secondary | ICD-10-CM | POA: Diagnosis not present

## 2018-09-23 DIAGNOSIS — R29898 Other symptoms and signs involving the musculoskeletal system: Secondary | ICD-10-CM | POA: Diagnosis not present

## 2018-09-23 DIAGNOSIS — S82832A Other fracture of upper and lower end of left fibula, initial encounter for closed fracture: Secondary | ICD-10-CM | POA: Diagnosis not present

## 2018-09-28 DIAGNOSIS — M546 Pain in thoracic spine: Secondary | ICD-10-CM | POA: Diagnosis not present

## 2018-10-04 DIAGNOSIS — C801 Malignant (primary) neoplasm, unspecified: Secondary | ICD-10-CM | POA: Diagnosis not present

## 2018-10-04 DIAGNOSIS — S82402D Unspecified fracture of shaft of left fibula, subsequent encounter for closed fracture with routine healing: Secondary | ICD-10-CM | POA: Diagnosis not present

## 2018-10-04 DIAGNOSIS — Z86718 Personal history of other venous thrombosis and embolism: Secondary | ICD-10-CM | POA: Diagnosis not present

## 2018-10-04 DIAGNOSIS — E039 Hypothyroidism, unspecified: Secondary | ICD-10-CM | POA: Diagnosis not present

## 2018-10-04 DIAGNOSIS — M48061 Spinal stenosis, lumbar region without neurogenic claudication: Secondary | ICD-10-CM | POA: Diagnosis not present

## 2018-10-06 DIAGNOSIS — S22040G Wedge compression fracture of fourth thoracic vertebra, subsequent encounter for fracture with delayed healing: Secondary | ICD-10-CM | POA: Diagnosis not present

## 2018-10-06 DIAGNOSIS — S3992XA Unspecified injury of lower back, initial encounter: Secondary | ICD-10-CM | POA: Diagnosis not present

## 2018-10-06 DIAGNOSIS — S22050A Wedge compression fracture of T5-T6 vertebra, initial encounter for closed fracture: Secondary | ICD-10-CM | POA: Diagnosis not present

## 2018-10-06 DIAGNOSIS — S22070G Wedge compression fracture of T9-T10 vertebra, subsequent encounter for fracture with delayed healing: Secondary | ICD-10-CM | POA: Diagnosis not present

## 2018-10-11 DIAGNOSIS — S8264XD Nondisplaced fracture of lateral malleolus of right fibula, subsequent encounter for closed fracture with routine healing: Secondary | ICD-10-CM | POA: Diagnosis not present

## 2018-10-11 DIAGNOSIS — M25572 Pain in left ankle and joints of left foot: Secondary | ICD-10-CM | POA: Diagnosis not present

## 2018-10-23 DIAGNOSIS — R29898 Other symptoms and signs involving the musculoskeletal system: Secondary | ICD-10-CM | POA: Diagnosis not present

## 2018-10-23 DIAGNOSIS — S82832A Other fracture of upper and lower end of left fibula, initial encounter for closed fracture: Secondary | ICD-10-CM | POA: Diagnosis not present

## 2018-10-29 DIAGNOSIS — S8264XD Nondisplaced fracture of lateral malleolus of right fibula, subsequent encounter for closed fracture with routine healing: Secondary | ICD-10-CM | POA: Diagnosis not present

## 2018-10-29 DIAGNOSIS — M25572 Pain in left ankle and joints of left foot: Secondary | ICD-10-CM | POA: Diagnosis not present

## 2018-11-23 DIAGNOSIS — R29898 Other symptoms and signs involving the musculoskeletal system: Secondary | ICD-10-CM | POA: Diagnosis not present

## 2018-11-23 DIAGNOSIS — S82832A Other fracture of upper and lower end of left fibula, initial encounter for closed fracture: Secondary | ICD-10-CM | POA: Diagnosis not present

## 2018-11-25 ENCOUNTER — Ambulatory Visit: Payer: PPO | Admitting: Neurology

## 2018-11-25 DIAGNOSIS — R1010 Upper abdominal pain, unspecified: Secondary | ICD-10-CM | POA: Diagnosis not present

## 2018-11-26 ENCOUNTER — Encounter: Payer: Self-pay | Admitting: Neurology

## 2018-11-29 ENCOUNTER — Ambulatory Visit (INDEPENDENT_AMBULATORY_CARE_PROVIDER_SITE_OTHER): Payer: PPO | Admitting: Neurology

## 2018-11-29 ENCOUNTER — Telehealth: Payer: Self-pay | Admitting: *Deleted

## 2018-11-29 ENCOUNTER — Encounter: Payer: Self-pay | Admitting: Neurology

## 2018-11-29 VITALS — BP 129/73 | HR 84 | Ht 60.0 in | Wt 111.0 lb

## 2018-11-29 DIAGNOSIS — W19XXXA Unspecified fall, initial encounter: Secondary | ICD-10-CM | POA: Insufficient documentation

## 2018-11-29 DIAGNOSIS — R251 Tremor, unspecified: Secondary | ICD-10-CM

## 2018-11-29 DIAGNOSIS — G250 Essential tremor: Secondary | ICD-10-CM | POA: Diagnosis not present

## 2018-11-29 DIAGNOSIS — W19XXXD Unspecified fall, subsequent encounter: Secondary | ICD-10-CM | POA: Diagnosis not present

## 2018-11-29 DIAGNOSIS — G2581 Restless legs syndrome: Secondary | ICD-10-CM

## 2018-11-29 DIAGNOSIS — G609 Hereditary and idiopathic neuropathy, unspecified: Secondary | ICD-10-CM | POA: Diagnosis not present

## 2018-11-29 DIAGNOSIS — G4700 Insomnia, unspecified: Secondary | ICD-10-CM | POA: Diagnosis not present

## 2018-11-29 DIAGNOSIS — R413 Other amnesia: Secondary | ICD-10-CM | POA: Diagnosis not present

## 2018-11-29 DIAGNOSIS — S8265XA Nondisplaced fracture of lateral malleolus of left fibula, initial encounter for closed fracture: Secondary | ICD-10-CM | POA: Diagnosis not present

## 2018-11-29 MED ORDER — ROPINIROLE HCL 1 MG PO TABS: 0.5000 mg | ORAL_TABLET | Freq: Every evening | ORAL | 4 refills | Status: DC

## 2018-11-29 MED ORDER — PRIMIDONE 50 MG PO TABS: 50.0000 mg | ORAL_TABLET | Freq: Every day | ORAL | 5 refills | Status: DC

## 2018-11-29 NOTE — Telephone Encounter (Signed)
Patient no showed ov appt today.

## 2018-11-29 NOTE — Progress Notes (Signed)
[name]    Provider:  Dr Jaynee Eagles Referring Provider: [name], MD Primary Care Physician:  [name], MD CC: Essential tremor, neuropathy  Interval history 11/29/2018: Here with daughter who provides much information for f/u of multiple problems including essential tremor, peripheral polyneuropathy, perceived memory loss, falls, RLS, insomnia. She has been thoroughly evaluated for al these causes and no further workup or treatment is recommended. At this point needs close management conservatively, using walking aids, not climbing stairs, avoid polypharmacy. She has arthritis "all over', her wrists hurt, her neck hurts, she does not use walking aids, she performs activities she shoud not, daughter has "given up". Tremor is stable. No side effects to the gabapentin, primidone, requip prn as needed. Her RLS is stable, tremor stable, neuropathy stable, continues to have falls and injuries due to non compliance.  Interval history 11/24/2017. She has significant mouth tremor. She can still do everything she wants to do. The mouth tremor is what bothers her the most. She feels a lot of muscle pain. Feels some cramps inher fingers and legs. Her legs are swelling. She has some swelling in the right leg. She is on xarelto for a DVT from [place]. She follows up at [facility] this month. Gets worse with prolonged sitting. She fell in the kitchen because her bedroom show fell off and she hit her head on the edge of the door. She feels PT helped and this was just a mechanical fall. She is raking leaves and mobility is good. She still has some pain in the feet, she takes gabapentin as needed. 2 at bedtime and one morning and one afternoon every day. Aspercreme helps.   Interval History 03/18/2017: She is still on the primidone and neurontin. Last time discussed trying Klonopin at last appointment but she lost the prescription. She can't remember people's names. Brother had Alzheimer's  and her sister had a cognitive disorder more with hallucinations unknown type. Family has had to help her with transactions and bills. She is not asking the same questions over and over, she is not getting lost, she is highly functional, she takes her own medications successfully. Suspect normal cognitive aging. She fell into the tub, unknown why she fell. She uses a cane. I did discuss the risks of Clonopin especially in the elderly, risk for falls, sedation, as well as dependency and withdrawal. Patient feels that the benefits outweigh the .She has not taken physical therapy as she can;t leave her home unattended. Will order physical therapy to her home.   Interval history: This is a lovely 85 year old female here for follow-up of essential tremor. We have tried multiple medications.Currently on 150mg  primidone, neurontin 300mg  tid. She is overworked and tired, doing a lot of yard work. No falls. Primidone makes her tired. She lives near her daughter now in [place] who is here with her today. We discussed multiple other options, she has had side effects to medications and a fall after taking propranolol. One option is not to take any more medications however patient feels that her mouth tremor is significantly disturbing and she would like to try something else. Mouth tremors can be the most refractory in this condition. At this point we could try Klonopin and decrease her primidone. I did discuss the risks of Clonopin especially in the elderly, risk for falls, sedation, as well as dependency and withdrawal. Patient feels that the benefits outweigh the risks    Interval history 05/15/2016: 85 year old patient here for tremor. She is  on 2 tabs of primidone at night. She is beginning to drool. Would increase to 3 or 4 tablets at night.She is having balance problems, I recommend physical therapy. She is moving to [employer]. When she stands up she often has to step back or side step. She is moving into a house  out there close to where she was born, close to sister and daughter. Would go up on the primidone by one pill at night. Can also increase her gabapentin as this is also a medication that may help with essential tremor.   Interval history 01/16/2016: Sheis here for follow up on Tremor is worsening and now involving her chin worse. She is only on 1/2 pill of 50mg  Primidone. Will slowly increase as tolerated. Patient can increase to a pill at night for 1-2 weeks and then go up by 1/2 a pill every few weeks as tolerated, just be careful for sedation ans side effects and risk of falls especially if she gets up in the middle of the night. She is going through testing for possible cancer, she is going to have a PET scan soon. No more falls, not getting on the roof or cleaning anymore. She is still cleaning up leaves and getting in the year and taking care of her house.She has some difficulty relaxing her legs. But her tone feels good without spasticity or anything that appears pathologic. She has some stiffness in the legs and ankles and joints due to osteoarthritis. Advised if she is in the kitchen make sure she has the spongy matts to help instead of standing on something hard, just ensure they are not slippery and well attached to the floor. She has some imbalance, advised her to use her cane and walker all the time. Also get up slowly. She has back pain and osteoarthritis as well contributing to her imbalance. She wears her back brace all the time when doing activities.    Interval history 07/19/2015: She was evaluated by Dr. Rexene Alberts in May who confirmed essential tremor. The tremor is still there. She is still taking the primidone at night. Her legs hurt, they are stiff and her feet hurt. She denies any sensory changes, no burning or tingling. They are very stiff. She has some redness and discoloration on her feet. She feels stiffness and swelling in the lower extremity. At the end of the day they are swollen. No  cramping in the feet. Movement of the toes hurts, she keeps saying stiffness. Denies tingling, burning. It hurts all the time. They don't keep her awake. She is taking the neurontin twice daily. She is tolerating the neurontin really well. Will increase it to three times a day. One am, one afternoon and 2 pm. Discussed with patient that the symptoms in her feet are more likely due to osteoarthritis then neuropathy. I do recommend she follow up with her primary care. Can increase Neurontin to 3 times a day and see if that helps. We'll continue on current dose of primidone due to sedation risk, also Neurontin is good for essential tremor so increasing that may help as well.   Interval history 03/07/2015: Dana Klein is a 85 y.o. female here as a follow up. She fell down the stairs. Broke her arm. She was hospitalized. Reviewed images of her brain that were done while she was in the hospital. She is taking 25mg  of the primidone. She will start taking the whole tablet.   MRI of the brain: personally reviewed images of the  brain and agree with the following:  IMPRESSION: No acute brain infarction. The cerebellum does not show any focal insult.  Mild age related atrophy and chronic small vessel change, less than often seen in healthy individuals of this age.  Some inflammatory change of the maxillary sinuses. Small amount of fluid in the mastoid air cells left more than right.  HPI: Dana Klein is a 85 y.o. female here as a follow up.  She worked out in the yard and is having pain in the anterior legs likely from overuse. She is a little slow, she hasn't been walking outside as much. Now that the weather is warm she can go back. Daughter denies hypophonia, changes in handwriting. She has hearing difficulty and usually talks too loud. Daughter feels expression is normal but family members have noticed the mild mouth tremor. Sinemet does not help her symptoms.   Emg/ncs 10/20: There is  electrophysiologic evidence of a symmetric length-dependent severe axonal sensorimotor polyneuropathy. In addition, there is evidence of a moderately-severe right median nerve entrapment at the wrist. Decreased insertional activity of distal leg muscles could be due to atrophy. Clinical correlation suggested   Initial visit 08/29/2014:[name] patient who was previously seeing Dr. Janann Colonel and is now following up with me. She was referred for tremor and was diagnosed with PD and is on Sinemet 3 times a day. Took her pill at 8:15 am so it is wearing off now. Tremor significantly improves with the medication. Her handwriting improves with the medication as well. Doesn't pay attention if she has any significant wearing off unless she is sitting reading but usually she is working outside and doesn't notice a tremor in fact was outside last night and just forgot to take her medication. No dyskinesias. Sometimes will fall over if she doesn't catch herself and "goes over". Using the cane when she goes out. At home doesn't use the cane. In the house she is fine, she can hold on and doesn't need it. Lives alone. Has a life alert necklace. Always wears it. Swallowing is fine, no choking. Sleeping well, uses requip for RLS. No rem sleep disorder. Not tired during the day.Notes some difficulty with short term memory, forgetting peoples names. Feels it is mild and not bothersome at this time  Had back surgery in 2010 and experiencing some low back pain and tightness in her leg muscles below the knees. She had a fracture. Back hurts when she bends over. If she wears tennis shoes, feels like a vice around the feet. No bruning, no numbness, no tingling. Elevating the legs help.   Reviewed notes, labs and imaging from outside physicians, which showed: Initial visit 08/2013, tremor started a year previous both hands (unsure if started in one or other or both) and mostly at rest. Feels walking is slow, unsteady, trouble  turning. Good sense of smell.   Has insomnia, is stable. Requip and Neurontin for RLS helps her sleep. Recent labs all unremarkable: cbc, crp, cmp, b12     Review of Systems: Patient complains of symptoms per HPI as well as the following symptoms: Tremor. Pertinent negatives and positives per HPI. All others negative.   Social History   Socioeconomic History  . Marital status: Widowed    Spouse name: Not on file  . Number of children: 1  . Years of education: 25  . Highest education level: Not on file  Occupational History    Employer: RETIRED    Comment: Retired  Scientific laboratory technician  .  Financial resource strain: Not on file  . Food insecurity:    Worry: Not on file    Inability: Not on file  . Transportation needs:    Medical: Not on file    Non-medical: Not on file  Tobacco Use  . Smoking status: Never Smoker  . Smokeless tobacco: Never Used  Substance and Sexual Activity  . Alcohol use: No    Alcohol/week: 0.0 standard drinks  . Drug use: No  . Sexual activity: Not on file  Lifestyle  . Physical activity:    Days per week: Not on file    Minutes per session: Not on file  . Stress: Not on file  Relationships  . Social connections:    Talks on phone: Not on file    Gets together: Not on file    Attends religious service: Not on file    Active member of club or organization: Not on file    Attends meetings of clubs or organizations: Not on file    Relationship status: Not on file  . Intimate partner violence:    Fear of current or ex partner: Not on file    Emotionally abused: Not on file    Physically abused: Not on file    Forced sexual activity: Not on file  Other Topics Concern  . Not on file  Social History Narrative   Patient is a widow . Patient is retired and has a Dance movement psychotherapist.   Right handed.   Caffeine- One cup daily coffee.   Lives at home alone     Family History  Problem Relation Age of Onset  . Lung cancer Mother   . Colon cancer  Mother   . Pneumonia Father   . Heart defect Father   . Diabetes Sister   . Diabetes Brother   . Diabetes Sister   . Diabetes Brother     Past Medical History:  Diagnosis Date  . Back pain   . Bladder incontinence   . DVT (deep venous thrombosis) ([place]) 11/2017  . Hearing loss   . Leg numbness   . Numbness of foot   . Osteoarthritis   . Thyroid disease     Past Surgical History:  Procedure Laterality Date  . ABDOMINAL HYSTERECTOMY    . BACK SURGERY    . CARPAL TUNNEL RELEASE    . Left Wrist Surgery      Current Outpatient Medications  Medication Sig Dispense Refill  . acetaminophen (TYLENOL) 500 MG tablet Take 500 mg by mouth 2 (two) times daily with a meal.    . Calcium Carbonate-Vitamin D (CALCIUM-D PO) Take 1 tablet by mouth 2 (two) times daily with a meal. Triple Strength    . donepezil (ARICEPT) 10 MG tablet Take 10 mg by mouth at bedtime.    . DULoxetine (CYMBALTA) 30 MG capsule Take 30 mg by mouth every morning.   12  . gabapentin (NEURONTIN) 300 MG capsule 1 CAP IN THE MORNING, 1 CAP IN AFTERNOON AND 2 CAPS IN EVENING-CAN INCREASE TO 2 CAPS 3X DAILY (Patient taking differently: Take 300-600 mg by mouth See admin instructions. 1 CAP (300mg  total) IN THE MORNING, 1 CAP (300mg  total) IN AFTERNOON AND 2 CAPS (600mg  total) IN EVENING) 180 capsule 6  . levothyroxine (SYNTHROID, LEVOTHROID) 75 MCG tablet Take 75 mcg by mouth daily before breakfast.     . magnesium oxide (MAG-OX) 400 MG tablet Take 400 mg by mouth at bedtime.     Marland Kitchen  Misc Natural Products (OSTEO BI-FLEX ADV JOINT SHIELD PO) Take 1 capsule by mouth 2 (two) times daily.     . Multiple Vitamin (MULTIVITAMIN WITH MINERALS) TABS tablet Take 1 tablet by mouth daily.    . Omega-3 Fatty Acids (FISH OIL) 1000 MG CAPS Take 1,000 mg by mouth 2 (two) times daily with a meal.    . pantoprazole (PROTONIX) 40 MG tablet Take 40 mg by mouth daily.  3  . Polyethyl Glycol-Propyl Glycol (SYSTANE ULTRA OP) Place 1 drop into both  eyes daily.    Vladimir Faster Glycol-Propyl Glycol (SYSTANE) 0.4-0.3 % GEL ophthalmic gel Place 1 application into both eyes at bedtime.    . polyethylene glycol (MIRALAX / GLYCOLAX) packet Take 17 g by mouth daily. (Patient taking differently: Take 17 g by mouth daily as needed. ) 14 each 0  . primidone (MYSOLINE) 50 MG tablet Take 1 tablet (50 mg total) by mouth at bedtime. 30 tablet 11  . rivaroxaban (XARELTO) 20 MG TABS tablet Take 20 mg by mouth daily with supper.    Marland Kitchen rOPINIRole (REQUIP) 1 MG tablet Take 0.5 tablets (0.5 mg total) by mouth every evening. 3 hours before bedtime 90 tablet 6  . tolterodine (DETROL LA) 4 MG 24 hr capsule Take 4 mg by mouth at bedtime.  6   No current facility-administered medications for this visit.     Allergies as of 11/29/2018 - Review Complete 11/29/2018  Allergen Reaction Noted  . Meloxicam Other (See Comments) 03/22/2018  . Propranolol Itching and Rash 02/18/2015  . Sulfa antibiotics Rash 06/10/2011    Vitals: BP 129/73 (BP Location: Right Arm, Patient Position: Sitting)   Pulse 84   Ht 5' (1.524 m)   Wt 111 lb (50.3 kg)   BMI 21.68 kg/m  Last Weight:  Wt Readings from Last 1 Encounters:  11/29/18 111 lb (50.3 kg)   Last Height:   Ht Readings from Last 1 Encounters:  11/29/18 5' (1.524 m)    Gait:  stooped, imbalance, bradykinetic, turns slowly, narrow but not shuffling. Using a cane.   Motor Observation:  resting tremor. Bilateral upper extremity postural and action tremor in the hands. High-frequency low amplitude. Chin tremor. Kyphotic.  Tone:  Normal muscle tone.   Strength:  Strength is 5/5 in the upper and lower limbs. Good strength.    Sensation: feels pinprick most in the feet today no decrease as we work more proximally   Reflexes: absent ajs, patellars and biceps symmetric and 2+, toes downgoing    Assessment/Plan: [age] year old woman presenting for follow up evaluation of history of bilateral  hand tremor(ET) and muscle cramping, neuropathy, imbalance, falls. Based on clinical history and physical exam her findings are most consistent with a diagnosis of essential tremor. Lat appointment complaining of worsening gait, low back pain, weakness and falls in the home, ordered PT to the home.   - We don't have much more to offer for tremor or neuropathy or falls, she needs to be managed conservatively and discussed compliance with walking aids which she disregards and continues to not use her tools and work for hours in the garden and has several compressed fractures. Daughter says she has "given up"  - Falls: multifactorial including tremor, not using walking aids consistently, using stairs at home, peripheral polyneuropathy, chronic low back pain, arthritic changes. Last fall, she was not using her walking aids and broke her ankle.   - Essential tremor: propranolol with rash and fall. On neurontin and primidone.  Could not tolerate Klonopin. Primidone makes her very tired and so she only takes very little at night, cant tolerate increase. patient feels that her mouth tremor is the worse but overall she is very active and mobile and the hand tremor does not limit her. Mouth tremors can be the most refractory in this condition. Continue Gabapentin for tremor.   - continue Requip 1mg  qhs for RLS, stable  - B12 level : normal in the past, memory is stable  - always use walking aid even in the house, fall precautions. High fall risk, has had PT  - Memory: monitor clinically, continue Aricept, may be normal cognitive aging or MCI vs early onset dementia  - insomnia: Discussed good sleep hygiene again, stable, primidone helps  - Neuropathy: Continue Neurontin dose which may also be helpful for essential tremor. Stable.  Physical Therapy: Sent to her home, she was evaluated and had a home inspection.  F/u as needed, may return to pcp unless new neurologic issue is to be addressed. Patiemt  prefers to continue being seen here, f/u 1 year  Refill meds  Meds ordered this encounter  Medications  . primidone (MYSOLINE) 50 MG tablet    Sig: Take 1 tablet (50 mg total) by mouth at bedtime.    Dispense:  90 tablet    Refill:  5  . rOPINIRole (REQUIP) 1 MG tablet    Sig: Take 0.5 tablets (0.5 mg total) by mouth every evening. 3 hours before bedtime    Dispense:  45 tablet    Refill:  4     Cc: Dr. Michela Pitcher, MD  [facility] [address] [place], [place] [address]  Phone [phone] Fax [phone]  A total of 30 minutes was spent face-to-face with this patient. Over half this time was spent on counseling patient on the  1. Essential tremor   2. Tremor   3. Peripheral neuropathy, idiopathic   4. Memory loss   5. Fall, subsequent encounter   6. Insomnia, unspecified type   7. RLS (restless legs syndrome)     diagnosis and different diagnostic and therapeutic options available.   Cc: Dr. Ashby Dawes

## 2018-11-29 NOTE — Progress Notes (Signed)
foll

## 2018-11-29 NOTE — Patient Instructions (Signed)
Continue Primidone(Mysoline) at night for tremor Continue Gabapentin for Tremor and pain and neuropathy Continue Primidone as needed for Restless Leg Follow up in one year.   Fall Prevention in the Home, Adult Falls can cause injuries. They can happen to people of all ages. There are many things you can do to make your home safe and to help prevent falls. Ask for help when making these changes, if needed. What actions can I take to prevent falls? General Instructions  Use good lighting in all rooms. Replace any light bulbs that burn out.  Turn on the lights when you go into a dark area. Use night-lights.  Keep items that you use often in easy-to-reach places. Lower the shelves around your home if necessary.  Set up your furniture so you have a clear path. Avoid moving your furniture around.  Do not have throw rugs and other things on the floor that can make you trip.  Avoid walking on wet floors.  If any of your floors are uneven, fix them.  Add color or contrast paint or tape to clearly mark and help you see: ? Any grab bars or handrails. ? First and last steps of stairways. ? Where the edge of each step is.  If you use a stepladder: ? Make sure that it is fully opened. Do not climb a closed stepladder. ? Make sure that both sides of the stepladder are locked into place. ? Ask someone to hold the stepladder for you while you use it.  If there are any pets around you, be aware of where they are. What can I do in the bathroom?      Keep the floor dry. Clean up any water that spills onto the floor as soon as it happens.  Remove soap buildup in the tub or shower regularly.  Use non-skid mats or decals on the floor of the tub or shower.  Attach bath mats securely with double-sided, non-slip rug tape.  If you need to sit down in the shower, use a plastic, non-slip stool.  Install grab bars by the toilet and in the tub and shower. Do not use towel bars as grab bars. What  can I do in the bedroom?  Make sure that you have a light by your bed that is easy to reach.  Do not use any sheets or blankets that are too big for your bed. They should not hang down onto the floor.  Have a firm chair that has side arms. You can use this for support while you get dressed. What can I do in the kitchen?  Clean up any spills right away.  If you need to reach something above you, use a strong step stool that has a grab bar.  Keep electrical cords out of the way.  Do not use floor polish or wax that makes floors slippery. If you must use wax, use non-skid floor wax. What can I do with my stairs?  Do not leave any items on the stairs.  Make sure that you have a light switch at the top of the stairs and the bottom of the stairs. If you do not have them, ask someone to add them for you.  Make sure that there are handrails on both sides of the stairs, and use them. Fix handrails that are broken or loose. Make sure that handrails are as long as the stairways.  Install non-slip stair treads on all stairs in your home.  Avoid having throw rugs  at the top or bottom of the stairs. If you do have throw rugs, attach them to the floor with carpet tape.  Choose a carpet that does not hide the edge of the steps on the stairway.  Check any carpeting to make sure that it is firmly attached to the stairs. Fix any carpet that is loose or worn. What can I do on the outside of my home?  Use bright outdoor lighting.  Regularly fix the edges of walkways and driveways and fix any cracks.  Remove anything that might make you trip as you walk through a door, such as a raised step or threshold.  Trim any bushes or trees on the path to your home.  Regularly check to see if handrails are loose or broken. Make sure that both sides of any steps have handrails.  Install guardrails along the edges of any raised decks and porches.  Clear walking paths of anything that might make someone  trip, such as tools or rocks.  Have any leaves, snow, or ice cleared regularly.  Use sand or salt on walking paths during winter.  Clean up any spills in your garage right away. This includes grease or oil spills. What other actions can I take?  Wear shoes that: ? Have a low heel. Do not wear high heels. ? Have rubber bottoms. ? Are comfortable and fit you well. ? Are closed at the toe. Do not wear open-toe sandals.  Use tools that help you move around (mobility aids) if they are needed. These include: ? Canes. ? Walkers. ? Scooters. ? Crutches.  Review your medicines with your doctor. Some medicines can make you feel dizzy. This can increase your chance of falling. Ask your doctor what other things you can do to help prevent falls. Where to find more information  Centers for Disease Control and Prevention, STEADI: https://garcia.biz/  [employer] on Aging: BrainJudge.co.uk Contact a doctor if:  You are afraid of falling at home.  You feel weak, drowsy, or dizzy at home.  You fall at home. Summary  There are many simple things that you can do to make your home safe and to help prevent falls.  Ways to make your home safe include removing tripping hazards and installing grab bars in the bathroom.  Ask for help when making these changes in your home. This information is not intended to replace advice given to you by your health care provider. Make sure you discuss any questions you have with your health care provider. Document Released: 08/30/2009 Document Revised: 06/18/2017 Document Reviewed: 06/18/2017 Elsevier Interactive Patient Education  2019 [employer].

## 2018-12-03 DIAGNOSIS — R1010 Upper abdominal pain, unspecified: Secondary | ICD-10-CM | POA: Diagnosis not present

## 2018-12-03 DIAGNOSIS — S22000D Wedge compression fracture of unspecified thoracic vertebra, subsequent encounter for fracture with routine healing: Secondary | ICD-10-CM | POA: Diagnosis not present

## 2018-12-06 DIAGNOSIS — S3992XA Unspecified injury of lower back, initial encounter: Secondary | ICD-10-CM | POA: Diagnosis not present

## 2018-12-06 DIAGNOSIS — S22070G Wedge compression fracture of T9-T10 vertebra, subsequent encounter for fracture with delayed healing: Secondary | ICD-10-CM | POA: Diagnosis not present

## 2018-12-06 DIAGNOSIS — S22050A Wedge compression fracture of T5-T6 vertebra, initial encounter for closed fracture: Secondary | ICD-10-CM | POA: Diagnosis not present

## 2018-12-06 DIAGNOSIS — S22040G Wedge compression fracture of fourth thoracic vertebra, subsequent encounter for fracture with delayed healing: Secondary | ICD-10-CM | POA: Diagnosis not present

## 2018-12-14 DIAGNOSIS — R0781 Pleurodynia: Secondary | ICD-10-CM | POA: Diagnosis not present

## 2018-12-14 DIAGNOSIS — S22000D Wedge compression fracture of unspecified thoracic vertebra, subsequent encounter for fracture with routine healing: Secondary | ICD-10-CM | POA: Diagnosis not present

## 2018-12-14 DIAGNOSIS — I10 Essential (primary) hypertension: Secondary | ICD-10-CM | POA: Diagnosis not present

## 2018-12-14 DIAGNOSIS — R296 Repeated falls: Secondary | ICD-10-CM | POA: Diagnosis not present

## 2018-12-14 DIAGNOSIS — M5136 Other intervertebral disc degeneration, lumbar region: Secondary | ICD-10-CM | POA: Diagnosis not present

## 2018-12-21 DIAGNOSIS — E039 Hypothyroidism, unspecified: Secondary | ICD-10-CM | POA: Diagnosis not present

## 2018-12-21 DIAGNOSIS — F0390 Unspecified dementia without behavioral disturbance: Secondary | ICD-10-CM | POA: Diagnosis not present

## 2018-12-21 DIAGNOSIS — I10 Essential (primary) hypertension: Secondary | ICD-10-CM | POA: Diagnosis not present

## 2018-12-22 ENCOUNTER — Other Ambulatory Visit: Payer: Self-pay | Admitting: Neurology

## 2018-12-22 DIAGNOSIS — G250 Essential tremor: Secondary | ICD-10-CM

## 2018-12-24 DIAGNOSIS — R29898 Other symptoms and signs involving the musculoskeletal system: Secondary | ICD-10-CM | POA: Diagnosis not present

## 2018-12-24 DIAGNOSIS — S82832A Other fracture of upper and lower end of left fibula, initial encounter for closed fracture: Secondary | ICD-10-CM | POA: Diagnosis not present

## 2018-12-26 ENCOUNTER — Other Ambulatory Visit: Payer: Self-pay | Admitting: Neurology

## 2018-12-26 DIAGNOSIS — G250 Essential tremor: Secondary | ICD-10-CM

## 2018-12-28 DIAGNOSIS — C799 Secondary malignant neoplasm of unspecified site: Secondary | ICD-10-CM | POA: Diagnosis not present

## 2018-12-28 DIAGNOSIS — E7800 Pure hypercholesterolemia, unspecified: Secondary | ICD-10-CM | POA: Diagnosis not present

## 2018-12-28 DIAGNOSIS — L309 Dermatitis, unspecified: Secondary | ICD-10-CM | POA: Diagnosis not present

## 2018-12-28 DIAGNOSIS — S22000D Wedge compression fracture of unspecified thoracic vertebra, subsequent encounter for fracture with routine healing: Secondary | ICD-10-CM | POA: Diagnosis not present

## 2018-12-28 DIAGNOSIS — F0390 Unspecified dementia without behavioral disturbance: Secondary | ICD-10-CM | POA: Diagnosis not present

## 2018-12-28 DIAGNOSIS — E039 Hypothyroidism, unspecified: Secondary | ICD-10-CM | POA: Diagnosis not present

## 2018-12-30 DIAGNOSIS — C801 Malignant (primary) neoplasm, unspecified: Secondary | ICD-10-CM | POA: Diagnosis not present

## 2018-12-30 DIAGNOSIS — E039 Hypothyroidism, unspecified: Secondary | ICD-10-CM | POA: Diagnosis not present

## 2018-12-30 DIAGNOSIS — D709 Neutropenia, unspecified: Secondary | ICD-10-CM | POA: Diagnosis not present

## 2018-12-30 DIAGNOSIS — Z7901 Long term (current) use of anticoagulants: Secondary | ICD-10-CM | POA: Diagnosis not present

## 2018-12-30 DIAGNOSIS — I10 Essential (primary) hypertension: Secondary | ICD-10-CM | POA: Diagnosis not present

## 2018-12-30 DIAGNOSIS — Z86718 Personal history of other venous thrombosis and embolism: Secondary | ICD-10-CM | POA: Diagnosis not present

## 2019-02-01 DIAGNOSIS — L308 Other specified dermatitis: Secondary | ICD-10-CM | POA: Diagnosis not present

## 2019-02-01 DIAGNOSIS — L7211 Pilar cyst: Secondary | ICD-10-CM | POA: Diagnosis not present

## 2019-02-01 DIAGNOSIS — R208 Other disturbances of skin sensation: Secondary | ICD-10-CM | POA: Diagnosis not present

## 2019-02-08 DIAGNOSIS — S92355A Nondisplaced fracture of fifth metatarsal bone, left foot, initial encounter for closed fracture: Secondary | ICD-10-CM | POA: Diagnosis not present

## 2019-02-08 DIAGNOSIS — M79672 Pain in left foot: Secondary | ICD-10-CM | POA: Diagnosis not present

## 2019-02-18 DIAGNOSIS — M6281 Muscle weakness (generalized): Secondary | ICD-10-CM | POA: Diagnosis not present

## 2019-02-18 DIAGNOSIS — J310 Chronic rhinitis: Secondary | ICD-10-CM | POA: Diagnosis not present

## 2019-02-18 DIAGNOSIS — R278 Other lack of coordination: Secondary | ICD-10-CM | POA: Diagnosis not present

## 2019-02-18 DIAGNOSIS — H6123 Impacted cerumen, bilateral: Secondary | ICD-10-CM | POA: Diagnosis not present

## 2019-02-22 DIAGNOSIS — R29898 Other symptoms and signs involving the musculoskeletal system: Secondary | ICD-10-CM | POA: Diagnosis not present

## 2019-02-22 DIAGNOSIS — S82832A Other fracture of upper and lower end of left fibula, initial encounter for closed fracture: Secondary | ICD-10-CM | POA: Diagnosis not present

## 2019-02-25 DIAGNOSIS — S92351A Displaced fracture of fifth metatarsal bone, right foot, initial encounter for closed fracture: Secondary | ICD-10-CM | POA: Diagnosis not present

## 2019-02-25 DIAGNOSIS — M79671 Pain in right foot: Secondary | ICD-10-CM | POA: Diagnosis not present

## 2019-02-28 ENCOUNTER — Telehealth: Payer: Self-pay | Admitting: Oncology

## 2019-02-28 NOTE — Telephone Encounter (Signed)
R/s appt per 4/09 sch message is aware of new appt date and time

## 2019-03-15 ENCOUNTER — Ambulatory Visit: Payer: PPO | Admitting: Oncology

## 2019-03-15 DIAGNOSIS — M79672 Pain in left foot: Secondary | ICD-10-CM | POA: Diagnosis not present

## 2019-03-15 DIAGNOSIS — S92355D Nondisplaced fracture of fifth metatarsal bone, left foot, subsequent encounter for fracture with routine healing: Secondary | ICD-10-CM | POA: Diagnosis not present

## 2019-03-16 ENCOUNTER — Ambulatory Visit: Payer: PPO | Admitting: Oncology

## 2019-03-24 DIAGNOSIS — S82832A Other fracture of upper and lower end of left fibula, initial encounter for closed fracture: Secondary | ICD-10-CM | POA: Diagnosis not present

## 2019-03-24 DIAGNOSIS — R29898 Other symptoms and signs involving the musculoskeletal system: Secondary | ICD-10-CM | POA: Diagnosis not present

## 2019-04-05 DIAGNOSIS — E7800 Pure hypercholesterolemia, unspecified: Secondary | ICD-10-CM | POA: Diagnosis not present

## 2019-04-05 DIAGNOSIS — F0390 Unspecified dementia without behavioral disturbance: Secondary | ICD-10-CM | POA: Diagnosis not present

## 2019-04-05 DIAGNOSIS — E039 Hypothyroidism, unspecified: Secondary | ICD-10-CM | POA: Diagnosis not present

## 2019-04-08 DIAGNOSIS — S79911A Unspecified injury of right hip, initial encounter: Secondary | ICD-10-CM | POA: Diagnosis not present

## 2019-04-08 DIAGNOSIS — M25551 Pain in right hip: Secondary | ICD-10-CM | POA: Diagnosis not present

## 2019-04-08 DIAGNOSIS — M169 Osteoarthritis of hip, unspecified: Secondary | ICD-10-CM | POA: Diagnosis not present

## 2019-04-08 DIAGNOSIS — R079 Chest pain, unspecified: Secondary | ICD-10-CM | POA: Diagnosis not present

## 2019-04-19 DIAGNOSIS — D692 Other nonthrombocytopenic purpura: Secondary | ICD-10-CM | POA: Diagnosis not present

## 2019-04-19 DIAGNOSIS — E7800 Pure hypercholesterolemia, unspecified: Secondary | ICD-10-CM | POA: Diagnosis not present

## 2019-04-19 DIAGNOSIS — Z7189 Other specified counseling: Secondary | ICD-10-CM | POA: Diagnosis not present

## 2019-04-19 DIAGNOSIS — C799 Secondary malignant neoplasm of unspecified site: Secondary | ICD-10-CM | POA: Diagnosis not present

## 2019-04-19 DIAGNOSIS — E039 Hypothyroidism, unspecified: Secondary | ICD-10-CM | POA: Diagnosis not present

## 2019-04-19 DIAGNOSIS — I10 Essential (primary) hypertension: Secondary | ICD-10-CM | POA: Diagnosis not present

## 2019-04-24 DIAGNOSIS — R29898 Other symptoms and signs involving the musculoskeletal system: Secondary | ICD-10-CM | POA: Diagnosis not present

## 2019-04-24 DIAGNOSIS — S82832A Other fracture of upper and lower end of left fibula, initial encounter for closed fracture: Secondary | ICD-10-CM | POA: Diagnosis not present

## 2019-05-24 DIAGNOSIS — S82832A Other fracture of upper and lower end of left fibula, initial encounter for closed fracture: Secondary | ICD-10-CM | POA: Diagnosis not present

## 2019-05-24 DIAGNOSIS — R29898 Other symptoms and signs involving the musculoskeletal system: Secondary | ICD-10-CM | POA: Diagnosis not present

## 2019-06-02 ENCOUNTER — Other Ambulatory Visit: Payer: Self-pay

## 2019-06-02 ENCOUNTER — Ambulatory Visit (HOSPITAL_COMMUNITY)
Admission: RE | Admit: 2019-06-02 | Discharge: 2019-06-02 | Disposition: A | Discharge status: Home / Self Care | Payer: PPO | Source: Ambulatory Visit | Attending: Internal Medicine | Admitting: Internal Medicine

## 2019-06-02 ENCOUNTER — Other Ambulatory Visit (HOSPITAL_COMMUNITY): Payer: Self-pay | Admitting: Internal Medicine

## 2019-06-02 DIAGNOSIS — M7989 Other specified soft tissue disorders: Secondary | ICD-10-CM

## 2019-06-02 DIAGNOSIS — Z7189 Other specified counseling: Secondary | ICD-10-CM | POA: Diagnosis not present

## 2019-06-02 DIAGNOSIS — R600 Localized edema: Secondary | ICD-10-CM | POA: Diagnosis not present

## 2019-06-02 DIAGNOSIS — M79604 Pain in right leg: Secondary | ICD-10-CM | POA: Diagnosis not present

## 2019-06-02 NOTE — Progress Notes (Signed)
Lower extremity venous has been completed.   Preliminary results in [facility] Proc.   Attempted to call results, office closed.   Abram Sander 06/02/2019 4:57 PM

## 2019-06-21 DIAGNOSIS — Z01419 Encounter for gynecological examination (general) (routine) without abnormal findings: Secondary | ICD-10-CM | POA: Diagnosis not present

## 2019-06-21 DIAGNOSIS — Z6823 Body mass index (BMI) 23.0-23.9, adult: Secondary | ICD-10-CM | POA: Diagnosis not present

## 2019-06-21 DIAGNOSIS — Z1231 Encounter for screening mammogram for malignant neoplasm of breast: Secondary | ICD-10-CM | POA: Diagnosis not present

## 2019-06-21 DIAGNOSIS — C541 Malignant neoplasm of endometrium: Secondary | ICD-10-CM | POA: Diagnosis not present

## 2019-06-21 DIAGNOSIS — N952 Postmenopausal atrophic vaginitis: Secondary | ICD-10-CM | POA: Diagnosis not present

## 2019-06-24 DIAGNOSIS — R29898 Other symptoms and signs involving the musculoskeletal system: Secondary | ICD-10-CM | POA: Diagnosis not present

## 2019-06-24 DIAGNOSIS — S82832A Other fracture of upper and lower end of left fibula, initial encounter for closed fracture: Secondary | ICD-10-CM | POA: Diagnosis not present

## 2019-06-27 DIAGNOSIS — J329 Chronic sinusitis, unspecified: Secondary | ICD-10-CM | POA: Diagnosis not present

## 2019-07-12 ENCOUNTER — Inpatient Hospital Stay: Payer: PPO | Attending: Oncology | Admitting: Oncology

## 2019-07-25 DIAGNOSIS — S82832A Other fracture of upper and lower end of left fibula, initial encounter for closed fracture: Secondary | ICD-10-CM | POA: Diagnosis not present

## 2019-07-25 DIAGNOSIS — R29898 Other symptoms and signs involving the musculoskeletal system: Secondary | ICD-10-CM | POA: Diagnosis not present

## 2019-08-03 ENCOUNTER — Telehealth: Payer: Self-pay | Admitting: Oncology

## 2019-08-03 NOTE — Telephone Encounter (Signed)
Returned patient's phone call regarding rescheduling an appointment, no voicemail set up.

## 2019-08-24 DIAGNOSIS — R29898 Other symptoms and signs involving the musculoskeletal system: Secondary | ICD-10-CM | POA: Diagnosis not present

## 2019-08-24 DIAGNOSIS — S82832A Other fracture of upper and lower end of left fibula, initial encounter for closed fracture: Secondary | ICD-10-CM | POA: Diagnosis not present

## 2019-08-29 DIAGNOSIS — T148XXA Other injury of unspecified body region, initial encounter: Secondary | ICD-10-CM | POA: Diagnosis not present

## 2019-08-29 DIAGNOSIS — W19XXXA Unspecified fall, initial encounter: Secondary | ICD-10-CM | POA: Diagnosis not present

## 2019-09-05 DIAGNOSIS — E7800 Pure hypercholesterolemia, unspecified: Secondary | ICD-10-CM | POA: Diagnosis not present

## 2019-09-05 DIAGNOSIS — D692 Other nonthrombocytopenic purpura: Secondary | ICD-10-CM | POA: Diagnosis not present

## 2019-09-05 DIAGNOSIS — E039 Hypothyroidism, unspecified: Secondary | ICD-10-CM | POA: Diagnosis not present

## 2019-09-05 DIAGNOSIS — Z7189 Other specified counseling: Secondary | ICD-10-CM | POA: Diagnosis not present

## 2019-09-05 DIAGNOSIS — I10 Essential (primary) hypertension: Secondary | ICD-10-CM | POA: Diagnosis not present

## 2019-09-05 DIAGNOSIS — Z0000 Encounter for general adult medical examination without abnormal findings: Secondary | ICD-10-CM | POA: Diagnosis not present

## 2019-09-06 DIAGNOSIS — Z7189 Other specified counseling: Secondary | ICD-10-CM | POA: Diagnosis not present

## 2019-09-06 DIAGNOSIS — C799 Secondary malignant neoplasm of unspecified site: Secondary | ICD-10-CM | POA: Diagnosis not present

## 2019-09-06 DIAGNOSIS — K5901 Slow transit constipation: Secondary | ICD-10-CM | POA: Diagnosis not present

## 2019-09-06 DIAGNOSIS — E039 Hypothyroidism, unspecified: Secondary | ICD-10-CM | POA: Diagnosis not present

## 2019-09-06 DIAGNOSIS — D692 Other nonthrombocytopenic purpura: Secondary | ICD-10-CM | POA: Diagnosis not present

## 2019-09-06 DIAGNOSIS — F0390 Unspecified dementia without behavioral disturbance: Secondary | ICD-10-CM | POA: Diagnosis not present

## 2019-09-06 DIAGNOSIS — S22000D Wedge compression fracture of unspecified thoracic vertebra, subsequent encounter for fracture with routine healing: Secondary | ICD-10-CM | POA: Diagnosis not present

## 2019-09-06 DIAGNOSIS — I10 Essential (primary) hypertension: Secondary | ICD-10-CM | POA: Diagnosis not present

## 2019-09-06 DIAGNOSIS — Z0000 Encounter for general adult medical examination without abnormal findings: Secondary | ICD-10-CM | POA: Diagnosis not present

## 2019-09-06 DIAGNOSIS — G20 Parkinson's disease: Secondary | ICD-10-CM | POA: Diagnosis not present

## 2019-09-24 DIAGNOSIS — S82832A Other fracture of upper and lower end of left fibula, initial encounter for closed fracture: Secondary | ICD-10-CM | POA: Diagnosis not present

## 2019-09-24 DIAGNOSIS — R29898 Other symptoms and signs involving the musculoskeletal system: Secondary | ICD-10-CM | POA: Diagnosis not present

## 2019-10-17 DIAGNOSIS — M549 Dorsalgia, unspecified: Secondary | ICD-10-CM | POA: Diagnosis not present

## 2019-10-17 DIAGNOSIS — S3992XA Unspecified injury of lower back, initial encounter: Secondary | ICD-10-CM | POA: Diagnosis not present

## 2019-10-17 DIAGNOSIS — M546 Pain in thoracic spine: Secondary | ICD-10-CM | POA: Diagnosis not present

## 2019-11-30 ENCOUNTER — Telehealth: Payer: Self-pay | Admitting: Neurology

## 2019-11-30 NOTE — Telephone Encounter (Signed)
Due to corona virus please reschedule patient's appointment next Monday. She can call us back to come to the office over the summer or as needed. If she needs to be seen please reschedule her for a video/telephone with Amy. Please also block the appointment spot thank you

## 2019-11-30 NOTE — Telephone Encounter (Signed)
I called patient and LVM to advise patient of cancelled appointment due to Coronavirus, as MD believes it is best for patient not to come in at this time. Per MD patient can reschedule for summertime or as needed. If patient needs appointment right away she may do a telephone/video visit with Amy NP.

## 2019-12-05 ENCOUNTER — Ambulatory Visit: Payer: PPO | Admitting: Neurology

## 2019-12-06 ENCOUNTER — Other Ambulatory Visit: Payer: Self-pay | Admitting: Neurology

## 2019-12-06 DIAGNOSIS — G250 Essential tremor: Secondary | ICD-10-CM

## 2019-12-07 ENCOUNTER — Other Ambulatory Visit: Payer: Self-pay | Admitting: Neurology

## 2019-12-12 ENCOUNTER — Other Ambulatory Visit: Payer: Self-pay | Admitting: Neurology

## 2019-12-12 NOTE — Telephone Encounter (Signed)
Error

## 2020-01-26 DIAGNOSIS — R634 Abnormal weight loss: Secondary | ICD-10-CM | POA: Diagnosis not present

## 2020-01-26 DIAGNOSIS — D649 Anemia, unspecified: Secondary | ICD-10-CM | POA: Diagnosis not present

## 2020-01-26 DIAGNOSIS — C772 Secondary and unspecified malignant neoplasm of intra-abdominal lymph nodes: Secondary | ICD-10-CM | POA: Diagnosis not present

## 2020-01-26 DIAGNOSIS — D709 Neutropenia, unspecified: Secondary | ICD-10-CM | POA: Diagnosis not present

## 2020-01-26 DIAGNOSIS — I82409 Acute embolism and thrombosis of unspecified deep veins of unspecified lower extremity: Secondary | ICD-10-CM | POA: Diagnosis not present

## 2020-01-26 DIAGNOSIS — E039 Hypothyroidism, unspecified: Secondary | ICD-10-CM | POA: Diagnosis not present

## 2020-01-26 DIAGNOSIS — I10 Essential (primary) hypertension: Secondary | ICD-10-CM | POA: Diagnosis not present

## 2020-01-26 DIAGNOSIS — R739 Hyperglycemia, unspecified: Secondary | ICD-10-CM | POA: Diagnosis not present

## 2020-01-26 DIAGNOSIS — C801 Malignant (primary) neoplasm, unspecified: Secondary | ICD-10-CM | POA: Diagnosis not present

## 2020-01-26 DIAGNOSIS — C771 Secondary and unspecified malignant neoplasm of intrathoracic lymph nodes: Secondary | ICD-10-CM | POA: Diagnosis not present

## 2020-01-26 DIAGNOSIS — I82452 Acute embolism and thrombosis of left peroneal vein: Secondary | ICD-10-CM | POA: Diagnosis not present

## 2020-01-26 DIAGNOSIS — C20 Malignant neoplasm of rectum: Secondary | ICD-10-CM | POA: Diagnosis not present

## 2020-01-26 DIAGNOSIS — D509 Iron deficiency anemia, unspecified: Secondary | ICD-10-CM | POA: Diagnosis not present

## 2020-02-28 DIAGNOSIS — E039 Hypothyroidism, unspecified: Secondary | ICD-10-CM | POA: Diagnosis not present

## 2020-02-28 DIAGNOSIS — I10 Essential (primary) hypertension: Secondary | ICD-10-CM | POA: Diagnosis not present

## 2020-02-28 DIAGNOSIS — D692 Other nonthrombocytopenic purpura: Secondary | ICD-10-CM | POA: Diagnosis not present

## 2020-03-06 DIAGNOSIS — C801 Malignant (primary) neoplasm, unspecified: Secondary | ICD-10-CM | POA: Diagnosis not present

## 2020-03-06 DIAGNOSIS — D692 Other nonthrombocytopenic purpura: Secondary | ICD-10-CM | POA: Diagnosis not present

## 2020-03-06 DIAGNOSIS — C799 Secondary malignant neoplasm of unspecified site: Secondary | ICD-10-CM | POA: Diagnosis not present

## 2020-03-06 DIAGNOSIS — G20 Parkinson's disease: Secondary | ICD-10-CM | POA: Diagnosis not present

## 2020-03-06 DIAGNOSIS — I10 Essential (primary) hypertension: Secondary | ICD-10-CM | POA: Diagnosis not present

## 2020-03-06 DIAGNOSIS — E782 Mixed hyperlipidemia: Secondary | ICD-10-CM | POA: Diagnosis not present

## 2020-03-06 DIAGNOSIS — E559 Vitamin D deficiency, unspecified: Secondary | ICD-10-CM | POA: Diagnosis not present

## 2020-04-30 ENCOUNTER — Other Ambulatory Visit (HOSPITAL_COMMUNITY): Payer: Self-pay | Admitting: Internal Medicine

## 2020-04-30 ENCOUNTER — Other Ambulatory Visit: Payer: Self-pay | Admitting: Internal Medicine

## 2020-04-30 DIAGNOSIS — R600 Localized edema: Secondary | ICD-10-CM

## 2020-04-30 DIAGNOSIS — J309 Allergic rhinitis, unspecified: Secondary | ICD-10-CM | POA: Diagnosis not present

## 2020-04-30 DIAGNOSIS — Z86718 Personal history of other venous thrombosis and embolism: Secondary | ICD-10-CM | POA: Diagnosis not present

## 2020-04-30 DIAGNOSIS — L03119 Cellulitis of unspecified part of limb: Secondary | ICD-10-CM | POA: Diagnosis not present

## 2020-05-03 ENCOUNTER — Ambulatory Visit (HOSPITAL_COMMUNITY)
Admission: RE | Admit: 2020-05-03 | Discharge: 2020-05-03 | Disposition: A | Discharge status: Home / Self Care | Payer: PPO | Source: Ambulatory Visit | Attending: Internal Medicine | Admitting: Internal Medicine

## 2020-05-03 ENCOUNTER — Other Ambulatory Visit: Payer: Self-pay

## 2020-05-03 DIAGNOSIS — R600 Localized edema: Secondary | ICD-10-CM | POA: Diagnosis not present

## 2020-05-04 DIAGNOSIS — R600 Localized edema: Secondary | ICD-10-CM | POA: Diagnosis not present

## 2020-05-04 DIAGNOSIS — J309 Allergic rhinitis, unspecified: Secondary | ICD-10-CM | POA: Diagnosis not present

## 2020-05-04 DIAGNOSIS — L03119 Cellulitis of unspecified part of limb: Secondary | ICD-10-CM | POA: Diagnosis not present

## 2020-05-04 DIAGNOSIS — Z86718 Personal history of other venous thrombosis and embolism: Secondary | ICD-10-CM | POA: Diagnosis not present

## 2020-05-15 DIAGNOSIS — L03116 Cellulitis of left lower limb: Secondary | ICD-10-CM | POA: Diagnosis not present

## 2020-05-15 DIAGNOSIS — L03115 Cellulitis of right lower limb: Secondary | ICD-10-CM | POA: Diagnosis not present

## 2020-05-15 DIAGNOSIS — R600 Localized edema: Secondary | ICD-10-CM | POA: Diagnosis not present

## 2020-05-25 DIAGNOSIS — L03116 Cellulitis of left lower limb: Secondary | ICD-10-CM | POA: Diagnosis not present

## 2020-05-25 DIAGNOSIS — R600 Localized edema: Secondary | ICD-10-CM | POA: Diagnosis not present

## 2020-05-25 DIAGNOSIS — L03115 Cellulitis of right lower limb: Secondary | ICD-10-CM | POA: Diagnosis not present

## 2020-06-06 ENCOUNTER — Other Ambulatory Visit: Payer: Self-pay | Admitting: Neurology

## 2020-08-02 DIAGNOSIS — R590 Localized enlarged lymph nodes: Secondary | ICD-10-CM | POA: Diagnosis not present

## 2020-08-02 DIAGNOSIS — I10 Essential (primary) hypertension: Secondary | ICD-10-CM | POA: Diagnosis not present

## 2020-08-02 DIAGNOSIS — C801 Malignant (primary) neoplasm, unspecified: Secondary | ICD-10-CM | POA: Diagnosis not present

## 2020-08-02 DIAGNOSIS — E039 Hypothyroidism, unspecified: Secondary | ICD-10-CM | POA: Diagnosis not present

## 2020-08-02 DIAGNOSIS — C786 Secondary malignant neoplasm of retroperitoneum and peritoneum: Secondary | ICD-10-CM | POA: Diagnosis not present

## 2020-08-02 DIAGNOSIS — C772 Secondary and unspecified malignant neoplasm of intra-abdominal lymph nodes: Secondary | ICD-10-CM | POA: Diagnosis not present

## 2020-08-02 DIAGNOSIS — D509 Iron deficiency anemia, unspecified: Secondary | ICD-10-CM | POA: Diagnosis not present

## 2020-08-02 DIAGNOSIS — R7989 Other specified abnormal findings of blood chemistry: Secondary | ICD-10-CM | POA: Diagnosis not present

## 2020-08-02 DIAGNOSIS — R978 Other abnormal tumor markers: Secondary | ICD-10-CM | POA: Diagnosis not present

## 2020-09-05 DIAGNOSIS — G20 Parkinson's disease: Secondary | ICD-10-CM | POA: Diagnosis not present

## 2020-09-05 DIAGNOSIS — E782 Mixed hyperlipidemia: Secondary | ICD-10-CM | POA: Diagnosis not present

## 2020-09-05 DIAGNOSIS — D692 Other nonthrombocytopenic purpura: Secondary | ICD-10-CM | POA: Diagnosis not present

## 2020-09-05 DIAGNOSIS — I10 Essential (primary) hypertension: Secondary | ICD-10-CM | POA: Diagnosis not present

## 2020-09-05 DIAGNOSIS — E559 Vitamin D deficiency, unspecified: Secondary | ICD-10-CM | POA: Diagnosis not present

## 2020-09-11 DIAGNOSIS — I10 Essential (primary) hypertension: Secondary | ICD-10-CM | POA: Diagnosis not present

## 2020-09-11 DIAGNOSIS — F0390 Unspecified dementia without behavioral disturbance: Secondary | ICD-10-CM | POA: Diagnosis not present

## 2020-09-11 DIAGNOSIS — Z0000 Encounter for general adult medical examination without abnormal findings: Secondary | ICD-10-CM | POA: Diagnosis not present

## 2020-09-11 DIAGNOSIS — Z23 Encounter for immunization: Secondary | ICD-10-CM | POA: Diagnosis not present

## 2020-09-11 DIAGNOSIS — E782 Mixed hyperlipidemia: Secondary | ICD-10-CM | POA: Diagnosis not present

## 2020-09-11 DIAGNOSIS — E039 Hypothyroidism, unspecified: Secondary | ICD-10-CM | POA: Diagnosis not present

## 2020-09-11 DIAGNOSIS — R7303 Prediabetes: Secondary | ICD-10-CM | POA: Diagnosis not present

## 2020-09-11 DIAGNOSIS — C799 Secondary malignant neoplasm of unspecified site: Secondary | ICD-10-CM | POA: Diagnosis not present

## 2020-09-11 DIAGNOSIS — D692 Other nonthrombocytopenic purpura: Secondary | ICD-10-CM | POA: Diagnosis not present

## 2020-09-11 DIAGNOSIS — G20 Parkinson's disease: Secondary | ICD-10-CM | POA: Diagnosis not present

## 2021-01-31 DIAGNOSIS — D509 Iron deficiency anemia, unspecified: Secondary | ICD-10-CM | POA: Diagnosis not present

## 2021-01-31 DIAGNOSIS — D649 Anemia, unspecified: Secondary | ICD-10-CM | POA: Diagnosis not present

## 2021-01-31 DIAGNOSIS — R978 Other abnormal tumor markers: Secondary | ICD-10-CM | POA: Diagnosis not present

## 2021-01-31 DIAGNOSIS — C801 Malignant (primary) neoplasm, unspecified: Secondary | ICD-10-CM | POA: Diagnosis not present

## 2021-01-31 DIAGNOSIS — E039 Hypothyroidism, unspecified: Secondary | ICD-10-CM | POA: Diagnosis not present

## 2021-01-31 DIAGNOSIS — Z7901 Long term (current) use of anticoagulants: Secondary | ICD-10-CM | POA: Diagnosis not present

## 2021-01-31 DIAGNOSIS — C772 Secondary and unspecified malignant neoplasm of intra-abdominal lymph nodes: Secondary | ICD-10-CM | POA: Diagnosis not present

## 2021-01-31 DIAGNOSIS — I82452 Acute embolism and thrombosis of left peroneal vein: Secondary | ICD-10-CM | POA: Diagnosis not present

## 2021-01-31 DIAGNOSIS — I10 Essential (primary) hypertension: Secondary | ICD-10-CM | POA: Diagnosis not present

## 2021-02-19 DIAGNOSIS — R7303 Prediabetes: Secondary | ICD-10-CM | POA: Diagnosis not present

## 2021-02-19 DIAGNOSIS — E039 Hypothyroidism, unspecified: Secondary | ICD-10-CM | POA: Diagnosis not present

## 2021-02-19 DIAGNOSIS — D692 Other nonthrombocytopenic purpura: Secondary | ICD-10-CM | POA: Diagnosis not present

## 2021-02-19 DIAGNOSIS — I10 Essential (primary) hypertension: Secondary | ICD-10-CM | POA: Diagnosis not present

## 2021-02-19 DIAGNOSIS — E782 Mixed hyperlipidemia: Secondary | ICD-10-CM | POA: Diagnosis not present

## 2021-04-18 DIAGNOSIS — D692 Other nonthrombocytopenic purpura: Secondary | ICD-10-CM | POA: Diagnosis not present

## 2021-04-18 DIAGNOSIS — F0390 Unspecified dementia without behavioral disturbance: Secondary | ICD-10-CM | POA: Diagnosis not present

## 2021-04-18 DIAGNOSIS — E039 Hypothyroidism, unspecified: Secondary | ICD-10-CM | POA: Diagnosis not present

## 2021-04-18 DIAGNOSIS — W19XXXA Unspecified fall, initial encounter: Secondary | ICD-10-CM | POA: Diagnosis not present

## 2021-04-18 DIAGNOSIS — I10 Essential (primary) hypertension: Secondary | ICD-10-CM | POA: Diagnosis not present

## 2021-04-18 DIAGNOSIS — G20 Parkinson's disease: Secondary | ICD-10-CM | POA: Diagnosis not present

## 2021-04-18 DIAGNOSIS — E782 Mixed hyperlipidemia: Secondary | ICD-10-CM | POA: Diagnosis not present

## 2021-04-18 DIAGNOSIS — R7303 Prediabetes: Secondary | ICD-10-CM | POA: Diagnosis not present

## 2021-05-09 DIAGNOSIS — I959 Hypotension, unspecified: Secondary | ICD-10-CM | POA: Diagnosis not present

## 2021-05-09 DIAGNOSIS — S51011A Laceration without foreign body of right elbow, initial encounter: Secondary | ICD-10-CM | POA: Diagnosis not present

## 2021-05-09 DIAGNOSIS — L89141 Pressure ulcer of left lower back, stage 1: Secondary | ICD-10-CM | POA: Diagnosis not present

## 2021-05-09 DIAGNOSIS — R5381 Other malaise: Secondary | ICD-10-CM | POA: Diagnosis not present

## 2021-05-16 DIAGNOSIS — G20 Parkinson's disease: Secondary | ICD-10-CM | POA: Diagnosis not present

## 2021-05-16 DIAGNOSIS — I10 Essential (primary) hypertension: Secondary | ICD-10-CM | POA: Diagnosis not present

## 2021-05-16 DIAGNOSIS — F0390 Unspecified dementia without behavioral disturbance: Secondary | ICD-10-CM | POA: Diagnosis not present

## 2021-05-16 DIAGNOSIS — E7800 Pure hypercholesterolemia, unspecified: Secondary | ICD-10-CM | POA: Diagnosis not present

## 2021-05-28 DIAGNOSIS — I10 Essential (primary) hypertension: Secondary | ICD-10-CM | POA: Diagnosis not present

## 2021-05-28 DIAGNOSIS — R5381 Other malaise: Secondary | ICD-10-CM | POA: Diagnosis not present

## 2021-05-28 DIAGNOSIS — F0151 Vascular dementia with behavioral disturbance: Secondary | ICD-10-CM | POA: Diagnosis not present

## 2021-05-28 DIAGNOSIS — E7800 Pure hypercholesterolemia, unspecified: Secondary | ICD-10-CM | POA: Diagnosis not present

## 2021-05-28 DIAGNOSIS — E039 Hypothyroidism, unspecified: Secondary | ICD-10-CM | POA: Diagnosis not present

## 2021-05-28 DIAGNOSIS — R636 Underweight: Secondary | ICD-10-CM | POA: Diagnosis not present

## 2021-05-28 DIAGNOSIS — C799 Secondary malignant neoplasm of unspecified site: Secondary | ICD-10-CM | POA: Diagnosis not present

## 2021-05-28 DIAGNOSIS — G20 Parkinson's disease: Secondary | ICD-10-CM | POA: Diagnosis not present

## 2021-05-28 DIAGNOSIS — I82502 Chronic embolism and thrombosis of unspecified deep veins of left lower extremity: Secondary | ICD-10-CM | POA: Diagnosis not present

## 2021-05-28 DIAGNOSIS — T07XXXA Unspecified multiple injuries, initial encounter: Secondary | ICD-10-CM | POA: Diagnosis not present

## 2021-05-28 DIAGNOSIS — M797 Fibromyalgia: Secondary | ICD-10-CM | POA: Diagnosis not present

## 2021-06-05 ENCOUNTER — Ambulatory Visit: Payer: PPO | Admitting: Neurology

## 2021-07-18 DEATH — deceased
